# Patient Record
Sex: Female | Born: 1955 | Race: White | Hispanic: No | Marital: Married | Smoking: Former smoker
Health system: Southern US, Community
[De-identification: ages and names within clinical notes are randomized; demographics above are authoritative.]

## PROBLEM LIST (undated history)

## (undated) DIAGNOSIS — I639 Cerebral infarction, unspecified: Secondary | ICD-10-CM

## (undated) DIAGNOSIS — I482 Chronic atrial fibrillation, unspecified: Secondary | ICD-10-CM

## (undated) DIAGNOSIS — I2510 Atherosclerotic heart disease of native coronary artery without angina pectoris: Secondary | ICD-10-CM

## (undated) DIAGNOSIS — G9341 Metabolic encephalopathy: Secondary | ICD-10-CM

## (undated) DIAGNOSIS — IMO0002 Reserved for concepts with insufficient information to code with codable children: Secondary | ICD-10-CM

## (undated) DIAGNOSIS — J9621 Acute and chronic respiratory failure with hypoxia: Secondary | ICD-10-CM

## (undated) DIAGNOSIS — E785 Hyperlipidemia, unspecified: Secondary | ICD-10-CM

## (undated) DIAGNOSIS — J90 Pleural effusion, not elsewhere classified: Secondary | ICD-10-CM

## (undated) DIAGNOSIS — J9611 Chronic respiratory failure with hypoxia: Secondary | ICD-10-CM

## (undated) DIAGNOSIS — E039 Hypothyroidism, unspecified: Secondary | ICD-10-CM

## (undated) DIAGNOSIS — Z930 Tracheostomy status: Secondary | ICD-10-CM

## (undated) DIAGNOSIS — I5023 Acute on chronic systolic (congestive) heart failure: Secondary | ICD-10-CM

## (undated) DIAGNOSIS — E1165 Type 2 diabetes mellitus with hyperglycemia: Secondary | ICD-10-CM

## (undated) DIAGNOSIS — K7460 Unspecified cirrhosis of liver: Secondary | ICD-10-CM

## (undated) DIAGNOSIS — I10 Essential (primary) hypertension: Secondary | ICD-10-CM

## (undated) DIAGNOSIS — R188 Other ascites: Secondary | ICD-10-CM

## (undated) HISTORY — PX: AORTIC VALVE REPLACEMENT (AVR)/CORONARY ARTERY BYPASS GRAFTING (CABG): SHX5725

## (undated) HISTORY — PX: TRACHEOSTOMY: SUR1362

## (undated) HISTORY — PX: CORONARY ARTERY BYPASS GRAFT: SHX141

---

## 2017-10-31 ENCOUNTER — Other Ambulatory Visit (HOSPITAL_COMMUNITY): Payer: Self-pay

## 2017-10-31 ENCOUNTER — Inpatient Hospital Stay
Admission: AD | Admit: 2017-10-31 | Discharge: 2017-12-12 | Payer: Medicare Other | Source: Ambulatory Visit | Attending: Internal Medicine | Admitting: Internal Medicine

## 2017-10-31 DIAGNOSIS — F330 Major depressive disorder, recurrent, mild: Secondary | ICD-10-CM

## 2017-10-31 DIAGNOSIS — R188 Other ascites: Secondary | ICD-10-CM

## 2017-10-31 DIAGNOSIS — J9621 Acute and chronic respiratory failure with hypoxia: Secondary | ICD-10-CM

## 2017-10-31 DIAGNOSIS — J9690 Respiratory failure, unspecified, unspecified whether with hypoxia or hypercapnia: Secondary | ICD-10-CM

## 2017-10-31 DIAGNOSIS — Z4659 Encounter for fitting and adjustment of other gastrointestinal appliance and device: Secondary | ICD-10-CM

## 2017-10-31 DIAGNOSIS — G9341 Metabolic encephalopathy: Secondary | ICD-10-CM

## 2017-10-31 DIAGNOSIS — I5023 Acute on chronic systolic (congestive) heart failure: Secondary | ICD-10-CM

## 2017-10-31 DIAGNOSIS — N179 Acute kidney failure, unspecified: Secondary | ICD-10-CM

## 2017-10-31 DIAGNOSIS — Z9689 Presence of other specified functional implants: Secondary | ICD-10-CM

## 2017-10-31 DIAGNOSIS — K567 Ileus, unspecified: Secondary | ICD-10-CM

## 2017-10-31 DIAGNOSIS — Z931 Gastrostomy status: Secondary | ICD-10-CM

## 2017-10-31 DIAGNOSIS — Z930 Tracheostomy status: Secondary | ICD-10-CM

## 2017-10-31 DIAGNOSIS — J90 Pleural effusion, not elsewhere classified: Secondary | ICD-10-CM | POA: Diagnosis present

## 2017-10-31 DIAGNOSIS — Z9889 Other specified postprocedural states: Secondary | ICD-10-CM

## 2017-10-31 DIAGNOSIS — I482 Chronic atrial fibrillation, unspecified: Secondary | ICD-10-CM | POA: Diagnosis present

## 2017-10-31 DIAGNOSIS — Z431 Encounter for attention to gastrostomy: Secondary | ICD-10-CM

## 2017-10-31 DIAGNOSIS — J9611 Chronic respiratory failure with hypoxia: Secondary | ICD-10-CM

## 2017-10-31 DIAGNOSIS — R0603 Acute respiratory distress: Secondary | ICD-10-CM

## 2017-10-31 HISTORY — DX: Reserved for concepts with insufficient information to code with codable children: IMO0002

## 2017-10-31 HISTORY — DX: Acute on chronic systolic (congestive) heart failure: I50.23

## 2017-10-31 HISTORY — DX: Pleural effusion, not elsewhere classified: J90

## 2017-10-31 HISTORY — DX: Unspecified cirrhosis of liver: K74.60

## 2017-10-31 HISTORY — DX: Chronic respiratory failure with hypoxia: J96.11

## 2017-10-31 HISTORY — DX: Tracheostomy status: Z93.0

## 2017-10-31 HISTORY — DX: Hyperlipidemia, unspecified: E78.5

## 2017-10-31 HISTORY — DX: Other ascites: R18.8

## 2017-10-31 HISTORY — DX: Essential (primary) hypertension: I10

## 2017-10-31 HISTORY — DX: Atherosclerotic heart disease of native coronary artery without angina pectoris: I25.10

## 2017-10-31 HISTORY — DX: Type 2 diabetes mellitus with hyperglycemia: E11.65

## 2017-10-31 HISTORY — DX: Acute and chronic respiratory failure with hypoxia: J96.21

## 2017-10-31 HISTORY — DX: Hypothyroidism, unspecified: E03.9

## 2017-10-31 HISTORY — DX: Chronic atrial fibrillation, unspecified: I48.20

## 2017-10-31 HISTORY — DX: Metabolic encephalopathy: G93.41

## 2017-10-31 HISTORY — DX: Cerebral infarction, unspecified: I63.9

## 2017-11-01 ENCOUNTER — Encounter: Payer: Self-pay | Admitting: Internal Medicine

## 2017-11-01 ENCOUNTER — Other Ambulatory Visit (HOSPITAL_COMMUNITY): Payer: Self-pay

## 2017-11-01 DIAGNOSIS — Z930 Tracheostomy status: Secondary | ICD-10-CM

## 2017-11-01 DIAGNOSIS — G9341 Metabolic encephalopathy: Secondary | ICD-10-CM

## 2017-11-01 DIAGNOSIS — I482 Chronic atrial fibrillation, unspecified: Secondary | ICD-10-CM | POA: Diagnosis present

## 2017-11-01 DIAGNOSIS — J9621 Acute and chronic respiratory failure with hypoxia: Secondary | ICD-10-CM | POA: Diagnosis not present

## 2017-11-01 DIAGNOSIS — I5023 Acute on chronic systolic (congestive) heart failure: Secondary | ICD-10-CM | POA: Diagnosis not present

## 2017-11-01 DIAGNOSIS — J9611 Chronic respiratory failure with hypoxia: Secondary | ICD-10-CM

## 2017-11-01 DIAGNOSIS — J90 Pleural effusion, not elsewhere classified: Secondary | ICD-10-CM | POA: Diagnosis present

## 2017-11-01 HISTORY — DX: Metabolic encephalopathy: G93.41

## 2017-11-01 HISTORY — DX: Acute and chronic respiratory failure with hypoxia: J96.21

## 2017-11-01 HISTORY — DX: Tracheostomy status: Z93.0

## 2017-11-01 HISTORY — DX: Chronic respiratory failure with hypoxia: J96.11

## 2017-11-01 HISTORY — DX: Acute on chronic systolic (congestive) heart failure: I50.23

## 2017-11-01 LAB — MAGNESIUM: Magnesium: 1.8 mg/dL (ref 1.7–2.4)

## 2017-11-01 LAB — URINALYSIS, MICROSCOPIC (REFLEX): WBC, UA: >50 WBC/hpf (ref 0–5)

## 2017-11-01 LAB — CBC WITH DIFFERENTIAL/PLATELET
Basophils Absolute: 0 10*3/uL (ref 0.0–0.1)
Basophils Relative: 0 %
EOS ABS: 0 10*3/uL (ref 0.0–0.7)
EOS PCT: 0 %
HCT: 30.9 % — ABNORMAL LOW (ref 36.0–46.0)
Hemoglobin: 9.5 g/dL — ABNORMAL LOW (ref 12.0–15.0)
Lymphocytes Relative: 6 %
Lymphs Abs: 0.7 10*3/uL (ref 0.7–4.0)
MCH: 28.8 pg (ref 26.0–34.0)
MCHC: 30.7 g/dL (ref 30.0–36.0)
MCV: 93.6 fL (ref 78.0–100.0)
MONO ABS: 0.6 10*3/uL (ref 0.1–1.0)
Monocytes Relative: 5 %
NEUTROS PCT: 89 %
Neutro Abs: 10.9 10*3/uL — ABNORMAL HIGH (ref 1.7–7.7)
PLATELETS: 313 10*3/uL (ref 150–400)
RBC: 3.3 MIL/uL — AB (ref 3.87–5.11)
RDW: 23 % — ABNORMAL HIGH (ref 11.5–15.5)
WBC: 12.2 10*3/uL — AB (ref 4.0–10.5)

## 2017-11-01 LAB — COMPREHENSIVE METABOLIC PANEL
ALBUMIN: 2.2 g/dL — AB (ref 3.5–5.0)
ALK PHOS: 139 U/L — AB (ref 38–126)
ALT: 15 U/L (ref 0–44)
AST: 20 U/L (ref 15–41)
Anion gap: 8 (ref 5–15)
BUN: 27 mg/dL — ABNORMAL HIGH (ref 8–23)
CALCIUM: 9.6 mg/dL (ref 8.9–10.3)
CO2: 25 mmol/L (ref 22–32)
Chloride: 105 mmol/L (ref 98–111)
Creatinine, Ser: 1.39 mg/dL — ABNORMAL HIGH (ref 0.44–1.00)
GFR calc Af Amer: 46 mL/min — ABNORMAL LOW (ref >60)
GFR calc non Af Amer: 40 mL/min — ABNORMAL LOW (ref >60)
GLUCOSE: 171 mg/dL — AB (ref 70–99)
Potassium: 3.7 mmol/L (ref 3.5–5.1)
Sodium: 138 mmol/L (ref 135–145)
Total Bilirubin: 1.2 mg/dL (ref 0.3–1.2)
Total Protein: 5.8 g/dL — ABNORMAL LOW (ref 6.5–8.1)

## 2017-11-01 LAB — PHOSPHORUS: Phosphorus: 2.9 mg/dL (ref 2.5–4.6)

## 2017-11-01 LAB — TSH: TSH: 16.168 u[IU]/mL — ABNORMAL HIGH (ref 0.350–4.500)

## 2017-11-01 LAB — HEMOGLOBIN A1C
HEMOGLOBIN A1C: 5.5 % (ref 4.8–5.6)
Mean Plasma Glucose: 111.15 mg/dL

## 2017-11-01 LAB — URINALYSIS, ROUTINE W REFLEX MICROSCOPIC
Glucose, UA: NEGATIVE mg/dL
KETONES UR: NEGATIVE mg/dL
Nitrite: NEGATIVE
Protein, ur: >300 mg/dL — AB
Specific Gravity, Urine: 1.025 (ref 1.005–1.030)
pH: 6 (ref 5.0–8.0)

## 2017-11-01 LAB — PROTIME-INR
INR: 1.15
PROTHROMBIN TIME: 14.6 s (ref 11.4–15.2)

## 2017-11-01 MED ORDER — GENERIC EXTERNAL MEDICATION
10.00 | Status: DC
Start: ? — End: 2017-11-01

## 2017-11-01 MED ORDER — FENTANYL CITRATE (PF) 2500 MCG/50ML IJ SOLN
50.00 | INTRAMUSCULAR | Status: DC
Start: ? — End: 2017-11-01

## 2017-11-01 MED ORDER — RIFAXIMIN 550 MG PO TABS
550.00 | ORAL_TABLET | ORAL | Status: DC
Start: 2017-10-31 — End: 2017-11-01

## 2017-11-01 MED ORDER — FIRST-LANSOPRAZOLE 3 MG/ML PO SUSP
30.00 | ORAL | Status: DC
Start: 2017-10-31 — End: 2017-11-01

## 2017-11-01 MED ORDER — SODIUM CHLORIDE 0.9 % IV SOLN
10.00 | INTRAVENOUS | Status: DC
Start: ? — End: 2017-11-01

## 2017-11-01 MED ORDER — SIMETHICONE 80 MG PO CHEW
80.00 | CHEWABLE_TABLET | ORAL | Status: DC
Start: 2017-10-31 — End: 2017-11-01

## 2017-11-01 MED ORDER — FAT EMULSION PLANT BASED 20 % IV EMUL
500.00 | INTRAVENOUS | Status: DC
Start: 2017-11-02 — End: 2017-11-01

## 2017-11-01 MED ORDER — GENERIC EXTERNAL MEDICATION
25.00 | Status: DC
Start: ? — End: 2017-11-01

## 2017-11-01 MED ORDER — DEXTROSE 10 % IV SOLN
75.00 | INTRAVENOUS | Status: DC
Start: ? — End: 2017-11-01

## 2017-11-01 MED ORDER — PAROXETINE HCL 20 MG PO TABS
40.00 | ORAL_TABLET | ORAL | Status: DC
Start: 2017-11-01 — End: 2017-11-01

## 2017-11-01 MED ORDER — GENERIC EXTERNAL MEDICATION
Status: DC
Start: ? — End: 2017-11-01

## 2017-11-01 MED ORDER — GENERIC EXTERNAL MEDICATION
3.00 | Status: DC
Start: ? — End: 2017-11-01

## 2017-11-01 MED ORDER — LEVOTHYROXINE SODIUM 200 MCG IV SOLR
200.00 | INTRAVENOUS | Status: DC
Start: 2017-11-02 — End: 2017-11-01

## 2017-11-01 MED ORDER — METOCLOPRAMIDE HCL 5 MG/ML IJ SOLN
5.00 | INTRAMUSCULAR | Status: DC
Start: 2017-10-31 — End: 2017-11-01

## 2017-11-01 MED ORDER — LIDOCAINE HCL (PF) 2 % IJ SOLN
10.00 | INTRAMUSCULAR | Status: DC
Start: ? — End: 2017-11-01

## 2017-11-01 MED ORDER — METOPROLOL TARTRATE 25 MG PO TABS
25.00 | ORAL_TABLET | ORAL | Status: DC
Start: 2017-10-31 — End: 2017-11-01

## 2017-11-01 MED ORDER — GENERIC EXTERNAL MEDICATION
5.00 | Status: DC
Start: ? — End: 2017-11-01

## 2017-11-01 MED ORDER — DEXTROSE-NACL 5-0.9 % IV SOLN
75.00 | INTRAVENOUS | Status: DC
Start: ? — End: 2017-11-01

## 2017-11-01 NOTE — Consult Note (Signed)
[facility]  Date of Service: 11/01/2017  PULMONARY CONSULT   Maryfrances Bunnellatricia A Wiseman  MWN:[id number]:[id number]  DOB: 01/24/1956   DOA: 10/31/2017  Referring Physician: Carron CurieAli Hijazi, MD  HPI: Maryfrances Bunnellatricia A Nin is a 62 y.o. female seen for follow up of Acute on Chronic Respiratory Failure.  Flex history including history of diabetes type 2 normal height both thyroidism hyperlipidemia hypertension coronary artery disease status post CABG chronic systolic heart failure stroke atrial fibrillation presented to the hospital appears on 29 July with complaints of abdominal pain low blood pressure altered mental status and acute respiratory failure.  An initial evaluation patient was emergently intubated and started on vasopressors.  She was found to be in shock felt in the undifferentiated variety of shock.  She did have a history of congestive heart failure with an ejection fraction of less than 25% so this was a working diagnosis.  Patient is also significantly encephalopathic and so therefore she was started empirically on lactulose.  Chest x-ray showed ongoing pulmonary edema.  Her oxygen requirements continued to remain high.  She was not able to wean off the ventilator and eventually had to have a tracheostomy done.  Her encephalopathy was felt the possibly hepatic related as she appeared to be treated for that.  GI recommendations were to complete Xifaxan twice daily and monitor her hemoglobin initially had been having some issues with GI bleeding apparently.  By the notes it appears that her hemoglobin had finally stabilized.  As far as her cardiac status is concerned she has an extremely low ejection fraction and she will probably need a defibrillator at some point which was not done  Review of Systems:  ROS performed and is unremarkable other than noted above.  Past Medical History:  Diagnosis Date  . Acute metabolic encephalopathy 11/01/2017  .  Acute on chronic respiratory failure with hypoxia (HCC) 11/01/2017  . Acute on chronic systolic CHF (congestive heart failure) (HCC) 11/01/2017  . Chronic atrial fibrillation (HCC)   . Chronic respiratory failure with hypoxia (HCC) 11/01/2017  . Coronary artery disease   . Diabetes type 2, uncontrolled (HCC)   . Hyperlipidemia   . Hypertension   . Hypothyroidism   . Pleural effusion, right   . Stroke (cerebrum) (HCC)   . 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 AORTIC VALVE REPLACEMENT (AVR)/CORONARY ARTERY BYPASS GRAFTING (CABG)    . CORONARY ARTERY BYPASS GRAFT    . TRACHEOSTOMY      Social History:    has an unknown smoking status. She has never used smokeless tobacco. She reports that she drank alcohol. She reports that she has current or past drug history.  Family History: Non-Contributory to the present illness  Allergies not on file  Medications: Reviewed on Rounds  Physical Exam:  Vitals:  Temperature 98.5 pulse 112 respiratory rate 32 blood pressure 147/96 saturations 98%  Ventilator Settings off the ventilator on T collar 28%  . General: Comfortable at this time . Eyes: Grossly normal lids, irises & conjunctiva . ENT: grossly tongue is normal . Neck: no obvious mass . Cardiovascular: S1-S2 normal no rubs . Respiratory: Coarse breath sounds few rhonchi . Abdomen: Obese soft . Skin: no rash seen on limited exam . Musculoskeletal: not rigid . Psychiatric:unable to assess . Neurologic: no seizure no involuntary movements         Labs on Admission:  Basic Metabolic Panel: Recent  Labs  Lab 11/01/17 0521  NA 138  K 3.7  CL 105  CO2 25  GLUCOSE 171*  BUN 27*  CREATININE 1.39*  CALCIUM 9.6  MG 1.8  PHOS 2.9    Liver Function Tests: Recent Labs  Lab 11/01/17 0521  AST 20  ALT 15  ALKPHOS 139*  BILITOT 1.2  PROT 5.8*  ALBUMIN 2.2*   No results for input(s): LIPASE, AMYLASE in the last 168  hours. No results for input(s): AMMONIA in the last 168 hours.  CBC: Recent Labs  Lab 11/01/17 0521  WBC 12.2*  NEUTROABS 10.9*  HGB 9.5*  HCT 30.9*  MCV 93.6  PLT 313    Cardiac Enzymes: No results for input(s): CKTOTAL, CKMB, CKMBINDEX, TROPONINI in the last 168 hours.  BNP (last 3 results) No results for input(s): BNP in the last 8760 hours.  ProBNP (last 3 results) No results for input(s): PROBNP in the last 8760 hours.   Radiological Exams on Admission: Dg Chest Port 1 View  Result Date: 10/31/2017 CLINICAL DATA:  Respiratory failure. EXAM: PORTABLE CHEST 1 VIEW COMPARISON:  None. FINDINGS: Tracheostomy tube projects 4.3 cm above the carina. LEFT PICC distal tip projects in mid superior vena cava. Nasogastric tube tip projecting in proximal stomach. Status post median sternotomy and pulmonary clipping. Moderate cardiomegaly. Pulmonary vascular congestion. Patchy RIGHT upper lobe and retrocardiac consolidation. Small pleural effusions. No pneumothorax. Surgical clips RIGHT chest wall, potentially within the breast. Osseous structures are non suspicious. IMPRESSION: 1. Tracheostomy tube projects 4.3 cm above the carina. LEFT PICC distal tip projects in mid superior vena cava. Nasogastric tube tip projecting in proximal stomach. 2. Cardiomegaly and pulmonary vascular congestion. Small pleural effusions. 3. RIGHT upper lobe and retrocardiac airspace opacities concerning for pneumonia. Followup PA and lateral chest X-ray is recommended in 3-4 weeks following trial of antibiotic therapy to ensure resolution and exclude underlying malignancy. Electronically Signed   By: Awilda Metroourtnay  Bloomer M.D.   On: 10/31/2017 19:49   Dg Abd Portable 1v  Result Date: 10/31/2017 CLINICAL DATA:  Ileus. EXAM: PORTABLE ABDOMEN - 1 VIEW COMPARISON:  None. FINDINGS: Nasogastric tube tip projects in proximal stomach. Gas-filled mildly distended small bowel in the included abdomen. Paucity of large bowel gas.  Central displacement of bowel loops. Included soft tissue planes and osseous structures are non suspicious. IMPRESSION: Nasogastric tube tip projects in proximal stomach. Mild gas distended small bowel seen with ileus or early small bowel obstruction. Central displacement of bowel concerning for ascites. Electronically Signed   By: Awilda Metroourtnay  Bloomer M.D.   On: 10/31/2017 19:51    Assessment/Plan Principal Problem:   Acute on chronic respiratory failure with hypoxia (HCC) Active Problems:   Chronic respiratory failure with hypoxia (HCC)   Acute on chronic systolic CHF (congestive heart failure) (HCC)   Acute metabolic encephalopathy   Tracheostomy status (HCC)   Chronic atrial fibrillation (HCC)   Pleural effusion, right   1. Acute on chronic respiratory failure with hypoxia at this time patient is on T collar has been doing fairly well with weaning.  We will continue to advance the weaning continue aggressive pulmonary toilet supportive care titrate oxygen as necessary 2. Acute on chronic systolic heart failure need to monitor her fluid status closely follow x-rays for any worsening of pulmonary edema.  Continue with supportive care 3. Acute metabolic encephalopathy right now appears to be stable we will continue to monitor 4. Chronic atrial fibrillation rate is controlled we will follow-up as tolerated. 5. Right-sided pleural  effusion follow x-ray as necessary.  The last chest film shows some concern for airspace disease could be versus heart failure would do a follow-up chest x-ray as indicated. 6. Tracheostomy status ultimate goal is to wean and decannulate  I have personally seen and evaluated the patient, evaluated laboratory and imaging results, formulated the assessment and plan and placed orders. The Patient requires high complexity decision making for assessment and support.  Case was discussed on Rounds with the Respiratory Therapy Staff Time Spent  Yevonne Pax, MD  [facility] Pulmonary Critical Care Medicine Sleep Medicine

## 2017-11-02 ENCOUNTER — Other Ambulatory Visit (HOSPITAL_COMMUNITY): Payer: Self-pay

## 2017-11-02 DIAGNOSIS — J9621 Acute and chronic respiratory failure with hypoxia: Secondary | ICD-10-CM | POA: Diagnosis not present

## 2017-11-02 DIAGNOSIS — G9341 Metabolic encephalopathy: Secondary | ICD-10-CM | POA: Diagnosis not present

## 2017-11-02 DIAGNOSIS — I482 Chronic atrial fibrillation: Secondary | ICD-10-CM | POA: Diagnosis not present

## 2017-11-02 DIAGNOSIS — I5023 Acute on chronic systolic (congestive) heart failure: Secondary | ICD-10-CM | POA: Diagnosis not present

## 2017-11-02 LAB — RENAL FUNCTION PANEL
ANION GAP: 10 (ref 5–15)
Albumin: 2.2 g/dL — ABNORMAL LOW (ref 3.5–5.0)
BUN: 28 mg/dL — ABNORMAL HIGH (ref 8–23)
CALCIUM: 9.7 mg/dL (ref 8.9–10.3)
CO2: 23 mmol/L (ref 22–32)
Chloride: 105 mmol/L (ref 98–111)
Creatinine, Ser: 1.3 mg/dL — ABNORMAL HIGH (ref 0.44–1.00)
GFR calc Af Amer: 50 mL/min — ABNORMAL LOW (ref >60)
GFR, EST NON AFRICAN AMERICAN: 43 mL/min — AB (ref >60)
Glucose, Bld: 192 mg/dL — ABNORMAL HIGH (ref 70–99)
Phosphorus: 3.5 mg/dL (ref 2.5–4.6)
Potassium: 4 mmol/L (ref 3.5–5.1)
SODIUM: 138 mmol/L (ref 135–145)

## 2017-11-02 LAB — MAGNESIUM: MAGNESIUM: 1.9 mg/dL (ref 1.7–2.4)

## 2017-11-02 LAB — CBC
HCT: 32.6 % — ABNORMAL LOW (ref 36.0–46.0)
Hemoglobin: 9.9 g/dL — ABNORMAL LOW (ref 12.0–15.0)
MCH: 28.6 pg (ref 26.0–34.0)
MCHC: 30.4 g/dL (ref 30.0–36.0)
MCV: 94.2 fL (ref 78.0–100.0)
Platelets: 291 10*3/uL (ref 150–400)
RBC: 3.46 MIL/uL — AB (ref 3.87–5.11)
RDW: 22.8 % — ABNORMAL HIGH (ref 11.5–15.5)
WBC: 10.7 10*3/uL — ABNORMAL HIGH (ref 4.0–10.5)

## 2017-11-02 NOTE — Progress Notes (Signed)
[facility] [place]   PULMONARY SERVICE  PROGRESS NOTE  Date of Service: 11/02/2017  Glenda Walker  ZOX:[id number]  DOB: 12/24/1955   DOA: 10/31/2017  Referring Physician: Carron CurieAli Hijazi, MD  HPI: Glenda Bunnellatricia A Auth is a 62 y.o. female seen for follow up of Acute on Chronic Respiratory Failure.  She is sitting up in the chair looks comfortable without distress.  Has been tolerating T collar remains on 28% FiO2  Medications: Reviewed on Rounds  Physical Exam:  Vitals: Temperature 97.0 pulse 93 respiratory rate 22 blood pressure 132/92 saturations 99%  Ventilator Settings on T collar FiO2 28%  . General: Comfortable at this time . Eyes: Grossly normal lids, irises & conjunctiva . ENT: grossly tongue is normal . Neck: no obvious mass . Cardiovascular: S1 S2 normal no gallop . Respiratory: No rhonchi no rales are noted . Abdomen: soft . Skin: no rash seen on limited exam . Musculoskeletal: not rigid . Psychiatric:unable to assess . Neurologic: no seizure no involuntary movements         Lab Data:   Basic Metabolic Panel: Recent Labs  Lab 11/01/17 0521 11/02/17 0457  NA 138 138  K 3.7 4.0  CL 105 105  CO2 25 23  GLUCOSE 171* 192*  BUN 27* 28*  CREATININE 1.39* 1.30*  CALCIUM 9.6 9.7  MG 1.8 1.9  PHOS 2.9 3.5    Liver Function Tests: Recent Labs  Lab 11/01/17 0521 11/02/17 0457  AST 20  --   ALT 15  --   ALKPHOS 139*  --   BILITOT 1.2  --   PROT 5.8*  --   ALBUMIN 2.2* 2.2*   No results for input(s): LIPASE, AMYLASE in the last 168 hours. No results for input(s): AMMONIA in the last 168 hours.  CBC: Recent Labs  Lab 11/01/17 0521 11/02/17 0457  WBC 12.2* 10.7*  NEUTROABS 10.9*  --   HGB 9.5* 9.9*  HCT 30.9* 32.6*  MCV 93.6 94.2  PLT 313 291    Cardiac Enzymes: No results for input(s): CKTOTAL, CKMB, CKMBINDEX, TROPONINI in the last 168 hours.  BNP (last 3 results) No results for input(s): BNP in  the last 8760 hours.  ProBNP (last 3 results) No results for input(s): PROBNP in the last 8760 hours.  Radiological Exams: Dg Chest Port 1 View  Result Date: 10/31/2017 CLINICAL DATA:  Respiratory failure. EXAM: PORTABLE CHEST 1 VIEW COMPARISON:  None. FINDINGS: Tracheostomy tube projects 4.3 cm above the carina. LEFT PICC distal tip projects in mid superior vena cava. Nasogastric tube tip projecting in proximal stomach. Status post median sternotomy and pulmonary clipping. Moderate cardiomegaly. Pulmonary vascular congestion. Patchy RIGHT upper lobe and retrocardiac consolidation. Small pleural effusions. No pneumothorax. Surgical clips RIGHT chest wall, potentially within the breast. Osseous structures are non suspicious. IMPRESSION: 1. Tracheostomy tube projects 4.3 cm above the carina. LEFT PICC distal tip projects in mid superior vena cava. Nasogastric tube tip projecting in proximal stomach. 2. Cardiomegaly and pulmonary vascular congestion. Small pleural effusions. 3. RIGHT upper lobe and retrocardiac airspace opacities concerning for pneumonia. Followup PA and lateral chest X-ray is recommended in 3-4 weeks following trial of antibiotic therapy to ensure resolution and exclude underlying malignancy. Electronically Signed   By: Awilda Metroourtnay  Bloomer M.D.   On: 10/31/2017 19:49   Dg Abd Portable 1v  Result Date: 11/01/2017 CLINICAL DATA:  NG tube placement. EXAM: PORTABLE ABDOMEN - 1 VIEW COMPARISON:  Single-view of the abdomen 10/31/2016. FINDINGS:  NG tube tip and side port project in the stomach. IMPRESSION: As above. Electronically Signed   By: Drusilla Kannerhomas  Dalessio M.D.   On: 11/01/2017 18:56   Dg Abd Portable 1v  Result Date: 10/31/2017 CLINICAL DATA:  Ileus. EXAM: PORTABLE ABDOMEN - 1 VIEW COMPARISON:  None. FINDINGS: Nasogastric tube tip projects in proximal stomach. Gas-filled mildly distended small bowel in the included abdomen. Paucity of large bowel gas. Central displacement of bowel loops.  Included soft tissue planes and osseous structures are non suspicious. IMPRESSION: Nasogastric tube tip projects in proximal stomach. Mild gas distended small bowel seen with ileus or early small bowel obstruction. Central displacement of bowel concerning for ascites. Electronically Signed   By: Awilda Metroourtnay  Bloomer M.D.   On: 10/31/2017 19:51    Assessment/Plan Principal Problem:   Acute on chronic respiratory failure with hypoxia (HCC) Active Problems:   Chronic respiratory failure with hypoxia (HCC)   Acute on chronic systolic CHF (congestive heart failure) (HCC)   Acute metabolic encephalopathy   Tracheostomy status (HCC)   Chronic atrial fibrillation (HCC)   Pleural effusion, right   1. Acute on chronic respiratory failure with hypoxia we will continue with weaning on T collar continue aggressive pulmonary toilet.  Secretions are still significant will need to monitor and see if she is ready for capping. 2. Chronic atrial fibrillation rate is controlled we will continue to follow the right. 3. Right-sided pleural effusion continue with supportive care 4. Tracheostomy working towards decannulation 5. Metabolic encephalopathy at baseline continue present management 6. Acute on chronic systolic heart failure compensated continue to follow   I have personally seen and evaluated the patient, evaluated laboratory and imaging results, formulated the assessment and plan and placed orders. The Patient requires high complexity decision making for assessment and support.  Case was discussed on Rounds with the Respiratory Therapy Staff time spent 35 minutes coordination of care with primary care physician team as well as medical staff  Yevonne PaxSaadat A Ballard Budney, MD [facility] Pulmonary Critical Care Medicine Sleep Medicine

## 2017-11-03 DIAGNOSIS — I5023 Acute on chronic systolic (congestive) heart failure: Secondary | ICD-10-CM | POA: Diagnosis not present

## 2017-11-03 DIAGNOSIS — J9621 Acute and chronic respiratory failure with hypoxia: Secondary | ICD-10-CM | POA: Diagnosis not present

## 2017-11-03 DIAGNOSIS — I482 Chronic atrial fibrillation: Secondary | ICD-10-CM | POA: Diagnosis not present

## 2017-11-03 DIAGNOSIS — G9341 Metabolic encephalopathy: Secondary | ICD-10-CM | POA: Diagnosis not present

## 2017-11-03 LAB — URINE CULTURE

## 2017-11-03 LAB — BLOOD GAS, ARTERIAL
Acid-Base Excess: 0.1 mmol/L (ref 0.0–2.0)
Bicarbonate: 24.6 mmol/L (ref 20.0–28.0)
FIO2: 28
O2 Saturation: 97.6 %
PCO2 ART: 42.9 mmHg (ref 32.0–48.0)
PH ART: 7.377 (ref 7.350–7.450)
Patient temperature: 98.6
pO2, Arterial: 97.3 mmHg (ref 83.0–108.0)

## 2017-11-03 NOTE — Progress Notes (Signed)
Pulmonary Critical Care Medicine [facility] [place]   PULMONARY SERVICE  PROGRESS NOTE  Date of Service: 11/03/2017  ELKA SATTERFIELD  ZOX:[id number]  DOB: 03/17/1955   DOA: 10/31/2017  Referring Physician: Carron Curie, MD  HPI: Glenda Walker is a 62 y.o. female seen for follow up of Acute on Chronic Respiratory Failure.  Patient right now is on T collar has been tolerating it well still has a #8 tube in place which I think can be changed at this point  Medications: Reviewed on Rounds  Physical Exam:  Vitals: Temperature 97.0 pulse 84 respiratory rate 18 blood pressure 160/52 saturations 99%  Ventilator Settings currently on T collar FiO2 28%  . General: Comfortable at this time . Eyes: Grossly normal lids, irises & conjunctiva . ENT: grossly tongue is normal . Neck: no obvious mass . Cardiovascular: S1 S2 normal no gallop . Respiratory: No rhonchi no rales are noted at this time . Abdomen: soft . Skin: no rash seen on limited exam . Musculoskeletal: not rigid . Psychiatric:unable to assess . Neurologic: no seizure no involuntary movements         Lab Data:   Basic Metabolic Panel: Recent Labs  Lab 11/01/17 0521 11/02/17 0457  NA 138 138  K 3.7 4.0  CL 105 105  CO2 25 23  GLUCOSE 171* 192*  BUN 27* 28*  CREATININE 1.39* 1.30*  CALCIUM 9.6 9.7  MG 1.8 1.9  PHOS 2.9 3.5    Liver Function Tests: Recent Labs  Lab 11/01/17 0521 11/02/17 0457  AST 20  --   ALT 15  --   ALKPHOS 139*  --   BILITOT 1.2  --   PROT 5.8*  --   ALBUMIN 2.2* 2.2*   No results for input(s): LIPASE, AMYLASE in the last 168 hours. No results for input(s): AMMONIA in the last 168 hours.  CBC: Recent Labs  Lab 11/01/17 0521 11/02/17 0457  WBC 12.2* 10.7*  NEUTROABS 10.9*  --   HGB 9.5* 9.9*  HCT 30.9* 32.6*  MCV 93.6 94.2  PLT 313 291    Cardiac Enzymes: No results for input(s): CKTOTAL, CKMB, CKMBINDEX, TROPONINI in the last 168 hours.  BNP (last 3  results) No results for input(s): BNP in the last 8760 hours.  ProBNP (last 3 results) No results for input(s): PROBNP in the last 8760 hours.  Radiological Exams: Dg Abd Portable 1v  Result Date: 11/01/2017 CLINICAL DATA:  NG tube placement. EXAM: PORTABLE ABDOMEN - 1 VIEW COMPARISON:  Single-view of the abdomen 10/31/2016. FINDINGS: NG tube tip and side port project in the stomach. IMPRESSION: As above. Electronically Signed   By: Drusilla Kanner M.D.   On: 11/01/2017 18:56    Assessment/Plan Principal Problem:   Acute on chronic respiratory failure with hypoxia (HCC) Active Problems:   Chronic respiratory failure with hypoxia (HCC)   Acute on chronic systolic CHF (congestive heart failure) (HCC)   Acute metabolic encephalopathy   Tracheostomy status (HCC)   Chronic atrial fibrillation (HCC)   Pleural effusion, right   1. Acute on chronic respiratory failure with hypoxia we will continue to wean on T collar 9 think I would go ahead and change her tracheostomy to #6 cuffless at this point. 2. Acute on chronic systolic heart failure compensated continue with supportive care diuresis tolerated 3. Acute metabolic encephalopathy grossly unchanged 4. Tracheostomy status as noted above we will downsize trach 5. Chronic atrial fibrillation rate is controlled we will continue to follow  6. Pleural effusion right-sided we will continue with present management   I have personally seen and evaluated the patient, evaluated laboratory and imaging results, formulated the assessment and plan and placed orders. The Patient requires high complexity decision making for assessment and support.  Case was discussed on Rounds with the Respiratory Therapy Staff  Yevonne PaxSaadat A Lyden Redner, MD [facility] Pulmonary Critical Care Medicine Sleep Medicine

## 2017-11-04 ENCOUNTER — Ambulatory Visit (HOSPITAL_COMMUNITY): Payer: Self-pay | Attending: Nurse Practitioner

## 2017-11-04 ENCOUNTER — Encounter (HOSPITAL_COMMUNITY): Payer: Self-pay

## 2017-11-04 DIAGNOSIS — Z029 Encounter for administrative examinations, unspecified: Secondary | ICD-10-CM | POA: Insufficient documentation

## 2017-11-04 DIAGNOSIS — M7989 Other specified soft tissue disorders: Secondary | ICD-10-CM

## 2017-11-04 DIAGNOSIS — G9341 Metabolic encephalopathy: Secondary | ICD-10-CM | POA: Diagnosis not present

## 2017-11-04 DIAGNOSIS — M79609 Pain in unspecified limb: Secondary | ICD-10-CM

## 2017-11-04 DIAGNOSIS — I5023 Acute on chronic systolic (congestive) heart failure: Secondary | ICD-10-CM | POA: Diagnosis not present

## 2017-11-04 DIAGNOSIS — I482 Chronic atrial fibrillation: Secondary | ICD-10-CM | POA: Diagnosis not present

## 2017-11-04 DIAGNOSIS — J9621 Acute and chronic respiratory failure with hypoxia: Secondary | ICD-10-CM | POA: Diagnosis not present

## 2017-11-04 NOTE — Progress Notes (Signed)
Pulmonary Critical Care Medicine [facility] [place]   PULMONARY CRITICAL CARE SERVICE  PROGRESS NOTE  Date of Service: 11/04/2017  Glenda Bunnellatricia A Kagel  ZOX:[id number]  DOB: 08/04/1955   DOA: 10/31/2017  Referring Physician: Carron CurieAli Hijazi, MD  HPI: Glenda Walker is a 62 y.o. female seen for follow up of Acute on Chronic Respiratory Failure.  Patient is on T collar has been a little bit more confused and probably has a urinary tract infection which is being addressed by the primary care team  Medications: Reviewed on Rounds  Physical Exam:  Vitals: Temperature 97.4 pulse 86 respiratory rate 30 blood pressure 142/50 saturations 98%  Ventilator Settings off the ventilator on T collar  . General: Comfortable at this time . Eyes: Grossly normal lids, irises & conjunctiva . ENT: grossly tongue is normal . Neck: no obvious mass . Cardiovascular: S1 S2 normal no gallop . Respiratory: Coarse breath sounds few scattered rhonchi are noted . Abdomen: soft . Skin: no rash seen on limited exam . Musculoskeletal: not rigid . Psychiatric:unable to assess . Neurologic: no seizure no involuntary movements         Lab Data:   Basic Metabolic Panel: Recent Labs  Lab 11/01/17 0521 11/02/17 0457  NA 138 138  K 3.7 4.0  CL 105 105  CO2 25 23  GLUCOSE 171* 192*  BUN 27* 28*  CREATININE 1.39* 1.30*  CALCIUM 9.6 9.7  MG 1.8 1.9  PHOS 2.9 3.5    Liver Function Tests: Recent Labs  Lab 11/01/17 0521 11/02/17 0457  AST 20  --   ALT 15  --   ALKPHOS 139*  --   BILITOT 1.2  --   PROT 5.8*  --   ALBUMIN 2.2* 2.2*   No results for input(s): LIPASE, AMYLASE in the last 168 hours. No results for input(s): AMMONIA in the last 168 hours.  CBC: Recent Labs  Lab 11/01/17 0521 11/02/17 0457  WBC 12.2* 10.7*  NEUTROABS 10.9*  --   HGB 9.5* 9.9*  HCT 30.9* 32.6*  MCV 93.6 94.2  PLT 313 291    Cardiac Enzymes: No results for input(s): CKTOTAL, CKMB, CKMBINDEX,  TROPONINI in the last 168 hours.  BNP (last 3 results) No results for input(s): BNP in the last 8760 hours.  ProBNP (last 3 results) No results for input(s): PROBNP in the last 8760 hours.  Radiological Exams: No results found.  Assessment/Plan Principal Problem:   Acute on chronic respiratory failure with hypoxia (HCC) Active Problems:   Chronic respiratory failure with hypoxia (HCC)   Acute on chronic systolic CHF (congestive heart failure) (HCC)   Acute metabolic encephalopathy   Tracheostomy status (HCC)   Chronic atrial fibrillation (HCC)   Pleural effusion, right   1. Acute on chronic respiratory failure with hypoxia continue with T collar for now.  Once she is clinically stable we will try to wean again. 2. Acute on chronic systolic heart failure right now compensated we will continue present management. 3. Acute metabolic encephalopathy she is once again confused probably secondary to infection 4. Chronic atrial fibrillation rate is controlled we will continue to follow 5. Pleural effusion right side we will continue with present therapy   I have personally seen and evaluated the patient, evaluated laboratory and imaging results, formulated the assessment and plan and placed orders. The Patient requires high complexity decision making for assessment and support.  Case was discussed on Rounds with the Respiratory Therapy Staff  Yevonne PaxSaadat A Leylah Tarnow, MD  [facility] Pulmonary Critical Care Medicine Sleep Medicine

## 2017-11-04 NOTE — Progress Notes (Signed)
*  Preliminary Results* Left upper extremity venous duplex completed. Visualized veins in left upper extremity are negative for deep vein thrombosis. This exam was limited due to patient positioning and port placement.   11/04/2017 3:40 PM  Lorae Roig Clare Gandyiddle

## 2017-11-05 ENCOUNTER — Other Ambulatory Visit (HOSPITAL_COMMUNITY): Payer: Self-pay

## 2017-11-05 LAB — BASIC METABOLIC PANEL
ANION GAP: 7 (ref 5–15)
BUN: 36 mg/dL — ABNORMAL HIGH (ref 8–23)
CO2: 22 mmol/L (ref 22–32)
Calcium: 9.5 mg/dL (ref 8.9–10.3)
Chloride: 105 mmol/L (ref 98–111)
Creatinine, Ser: 1.33 mg/dL — ABNORMAL HIGH (ref 0.44–1.00)
GFR calc Af Amer: 49 mL/min — ABNORMAL LOW (ref >60)
GFR, EST NON AFRICAN AMERICAN: 42 mL/min — AB (ref >60)
GLUCOSE: 244 mg/dL — AB (ref 70–99)
POTASSIUM: 3.9 mmol/L (ref 3.5–5.1)
Sodium: 134 mmol/L — ABNORMAL LOW (ref 135–145)

## 2017-11-05 LAB — CBC
HEMATOCRIT: 29.8 % — AB (ref 36.0–46.0)
Hemoglobin: 9.1 g/dL — ABNORMAL LOW (ref 12.0–15.0)
MCH: 29.4 pg (ref 26.0–34.0)
MCHC: 30.5 g/dL (ref 30.0–36.0)
MCV: 96.4 fL (ref 78.0–100.0)
Platelets: 261 10*3/uL (ref 150–400)
RBC: 3.09 MIL/uL — ABNORMAL LOW (ref 3.87–5.11)
RDW: 22.8 % — ABNORMAL HIGH (ref 11.5–15.5)
WBC: 13.5 10*3/uL — AB (ref 4.0–10.5)

## 2017-11-05 LAB — MAGNESIUM: Magnesium: 1.8 mg/dL (ref 1.7–2.4)

## 2017-11-05 LAB — PHOSPHORUS: Phosphorus: 3.1 mg/dL (ref 2.5–4.6)

## 2017-11-06 LAB — C DIFFICILE QUICK SCREEN W PCR REFLEX
C DIFFICILE (CDIFF) INTERP: NOT DETECTED
C DIFFICILE (CDIFF) TOXIN: NEGATIVE
C Diff antigen: NEGATIVE

## 2017-11-07 DIAGNOSIS — I5023 Acute on chronic systolic (congestive) heart failure: Secondary | ICD-10-CM | POA: Diagnosis not present

## 2017-11-07 DIAGNOSIS — I482 Chronic atrial fibrillation: Secondary | ICD-10-CM | POA: Diagnosis not present

## 2017-11-07 DIAGNOSIS — J9621 Acute and chronic respiratory failure with hypoxia: Secondary | ICD-10-CM | POA: Diagnosis not present

## 2017-11-07 DIAGNOSIS — G9341 Metabolic encephalopathy: Secondary | ICD-10-CM | POA: Diagnosis not present

## 2017-11-07 LAB — RENAL FUNCTION PANEL
ANION GAP: 11 (ref 5–15)
Albumin: 2.3 g/dL — ABNORMAL LOW (ref 3.5–5.0)
BUN: 44 mg/dL — ABNORMAL HIGH (ref 8–23)
CALCIUM: 10 mg/dL (ref 8.9–10.3)
CO2: 25 mmol/L (ref 22–32)
CREATININE: 1.72 mg/dL — AB (ref 0.44–1.00)
Chloride: 103 mmol/L (ref 98–111)
GFR calc non Af Amer: 31 mL/min — ABNORMAL LOW (ref >60)
GFR, EST AFRICAN AMERICAN: 36 mL/min — AB (ref >60)
Glucose, Bld: 135 mg/dL — ABNORMAL HIGH (ref 70–99)
PHOSPHORUS: 4.2 mg/dL (ref 2.5–4.6)
Potassium: 5.8 mmol/L — ABNORMAL HIGH (ref 3.5–5.1)
SODIUM: 139 mmol/L (ref 135–145)

## 2017-11-07 LAB — CBC
HEMATOCRIT: 35.1 % — AB (ref 36.0–46.0)
Hemoglobin: 10.6 g/dL — ABNORMAL LOW (ref 12.0–15.0)
MCH: 29 pg (ref 26.0–34.0)
MCHC: 30.2 g/dL (ref 30.0–36.0)
MCV: 96.2 fL (ref 78.0–100.0)
Platelets: 276 10*3/uL (ref 150–400)
RBC: 3.65 MIL/uL — ABNORMAL LOW (ref 3.87–5.11)
RDW: 22.8 % — ABNORMAL HIGH (ref 11.5–15.5)
WBC: 14.8 10*3/uL — ABNORMAL HIGH (ref 4.0–10.5)

## 2017-11-07 LAB — BLOOD GAS, ARTERIAL
ACID-BASE DEFICIT: 2.4 mmol/L — AB (ref 0.0–2.0)
BICARBONATE: 22.2 mmol/L (ref 20.0–28.0)
FIO2: 60
O2 Saturation: 99.5 %
PATIENT TEMPERATURE: 98.8
pCO2 arterial: 40.2 mmHg (ref 32.0–48.0)
pH, Arterial: 7.362 (ref 7.350–7.450)
pO2, Arterial: 170 mmHg — ABNORMAL HIGH (ref 83.0–108.0)

## 2017-11-07 LAB — MAGNESIUM: MAGNESIUM: 2.3 mg/dL (ref 1.7–2.4)

## 2017-11-07 LAB — POTASSIUM: POTASSIUM: 5.7 mmol/L — AB (ref 3.5–5.1)

## 2017-11-07 NOTE — Progress Notes (Signed)
Pulmonary Critical Care Medicine [facility] [place]   PULMONARY CRITICAL CARE SERVICE  PROGRESS NOTE  Date of Service: 11/07/2017  Glenda Walker  QMV:[id number]  DOB: November 29, 1955   DOA: 10/31/2017  Referring Physician: Carron Curie, MD  HPI: Glenda Walker is a 62 y.o. female seen for follow up of Acute on Chronic Respiratory Failure.  She is on T collar has been on 35% FiO2 has been having some issues with atrial fibrillation rapid response so therefore wean is not been consistent.  Medications: Reviewed on Rounds  Physical Exam:  Vitals: Temperature 98.8 pulse 116 respiratory rate 20 blood pressure 134/86 saturations 99%  Ventilator Settings off the ventilator on T collar FiO2 35%  . General: Comfortable at this time . Eyes: Grossly normal lids, irises & conjunctiva . ENT: grossly tongue is normal . Neck: no obvious mass . Cardiovascular: S1 S2 normal no gallop . Respiratory: Coarse breath sounds no rhonchi . Abdomen: soft . Skin: no rash seen on limited exam . Musculoskeletal: not rigid . Psychiatric:unable to assess . Neurologic: no seizure no involuntary movements         Lab Data:   Basic Metabolic Panel: Recent Labs  Lab 11/01/17 0521 11/02/17 0457 11/05/17 0514 11/07/17 0650  NA 138 138 134* 139  K 3.7 4.0 3.9 5.8*  CL 105 105 105 103  CO2 25 23 22 25   GLUCOSE 171* 192* 244* 135*  BUN 27* 28* 36* 44*  CREATININE 1.39* 1.30* 1.33* 1.72*  CALCIUM 9.6 9.7 9.5 10.0  MG 1.8 1.9 1.8 2.3  PHOS 2.9 3.5 3.1 4.2    Liver Function Tests: Recent Labs  Lab 11/01/17 0521 11/02/17 0457 11/07/17 0650  AST 20  --   --   ALT 15  --   --   ALKPHOS 139*  --   --   BILITOT 1.2  --   --   PROT 5.8*  --   --   ALBUMIN 2.2* 2.2* 2.3*   No results for input(s): LIPASE, AMYLASE in the last 168 hours. No results for input(s): AMMONIA in the last 168 hours.  CBC: Recent Labs  Lab 11/01/17 0521 11/02/17 0457 11/05/17 0514 11/07/17 0650  WBC  12.2* 10.7* 13.5* 14.8*  NEUTROABS 10.9*  --   --   --   HGB 9.5* 9.9* 9.1* 10.6*  HCT 30.9* 32.6* 29.8* 35.1*  MCV 93.6 94.2 96.4 96.2  PLT 313 291 261 276    Cardiac Enzymes: No results for input(s): CKTOTAL, CKMB, CKMBINDEX, TROPONINI in the last 168 hours.  BNP (last 3 results) No results for input(s): BNP in the last 8760 hours.  ProBNP (last 3 results) No results for input(s): PROBNP in the last 8760 hours.  Radiological Exams: Dg Abd Portable 1v  Result Date: 11/05/2017 CLINICAL DATA:  Ileus. EXAM: PORTABLE ABDOMEN - 1 VIEW COMPARISON:  One-view abdomen 11/01/2017 FINDINGS: Previously noted loops of small bowel are improved. The side port of the NGT is in the fundus of the stomach. Degenerative changes are noted in the lumbar spine. Clips are present along the fallopian tubes. IMPRESSION: 1. Improved bowel gas pattern significant distended bowel. 2. NG tube remains in the stomach. Electronically Signed   By: Marin Roberts M.D.   On: 11/05/2017 13:31    Assessment/Plan Principal Problem:   Acute on chronic respiratory failure with hypoxia (HCC) Active Problems:   Chronic respiratory failure with hypoxia (HCC)   Acute on chronic systolic CHF (congestive heart failure) (HCC)  Acute metabolic encephalopathy   Tracheostomy status (HCC)   Chronic atrial fibrillation (HCC)   Pleural effusion, right   1. Acute on chronic respiratory failure with hypoxia we will continue with T collar for now get her rate under better control. 2. Chronic atrial fibrillation rate has been elevated we will continue with supportive care monitor 3. Pleural effusion we will follow-up x-ray as necessary. 4. Acute on chronic systolic heart failure status. 5. Acute metabolic encephalopathy grossly unchanged we will follow along   I have personally seen and evaluated the patient, evaluated laboratory and imaging results, formulated the assessment and plan and placed orders. The Patient  requires high complexity decision making for assessment and support.  Case was discussed on Rounds with the Respiratory Therapy Staff  Yevonne PaxSaadat A Katlin Bortner, MD [facility] Pulmonary Critical Care Medicine Sleep Medicine \

## 2017-11-08 ENCOUNTER — Other Ambulatory Visit (HOSPITAL_COMMUNITY): Payer: Self-pay

## 2017-11-08 DIAGNOSIS — J9621 Acute and chronic respiratory failure with hypoxia: Secondary | ICD-10-CM | POA: Diagnosis not present

## 2017-11-08 DIAGNOSIS — G9341 Metabolic encephalopathy: Secondary | ICD-10-CM | POA: Diagnosis not present

## 2017-11-08 DIAGNOSIS — I5023 Acute on chronic systolic (congestive) heart failure: Secondary | ICD-10-CM | POA: Diagnosis not present

## 2017-11-08 DIAGNOSIS — I482 Chronic atrial fibrillation: Secondary | ICD-10-CM | POA: Diagnosis not present

## 2017-11-08 LAB — LACTIC ACID, PLASMA
LACTIC ACID, VENOUS: 1.8 mmol/L (ref 0.5–1.9)
Lactic Acid, Venous: 3 mmol/L (ref 0.5–1.9)
Lactic Acid, Venous: 2.1 mmol/L (ref 0.5–1.9)

## 2017-11-08 LAB — COMPREHENSIVE METABOLIC PANEL
ALK PHOS: 302 U/L — AB (ref 38–126)
ALT: 25 U/L (ref 0–44)
AST: 33 U/L (ref 15–41)
Albumin: 2.2 g/dL — ABNORMAL LOW (ref 3.5–5.0)
Anion gap: 11 (ref 5–15)
BUN: 55 mg/dL — ABNORMAL HIGH (ref 8–23)
CALCIUM: 9.8 mg/dL (ref 8.9–10.3)
CO2: 21 mmol/L — AB (ref 22–32)
CREATININE: 1.81 mg/dL — AB (ref 0.44–1.00)
Chloride: 105 mmol/L (ref 98–111)
GFR calc non Af Amer: 29 mL/min — ABNORMAL LOW (ref >60)
GFR, EST AFRICAN AMERICAN: 34 mL/min — AB (ref >60)
Glucose, Bld: 162 mg/dL — ABNORMAL HIGH (ref 70–99)
Potassium: 5.4 mmol/L — ABNORMAL HIGH (ref 3.5–5.1)
SODIUM: 137 mmol/L (ref 135–145)
Total Bilirubin: 1.1 mg/dL (ref 0.3–1.2)
Total Protein: 6.3 g/dL — ABNORMAL LOW (ref 6.5–8.1)

## 2017-11-08 LAB — AMMONIA: Ammonia: 31 umol/L (ref 9–35)

## 2017-11-08 LAB — POTASSIUM
Potassium: 4.7 mmol/L (ref 3.5–5.1)
Potassium: 4.6 mmol/L (ref 3.5–5.1)

## 2017-11-08 LAB — CBC
HCT: 37.2 % (ref 36.0–46.0)
Hemoglobin: 11.2 g/dL — ABNORMAL LOW (ref 12.0–15.0)
MCH: 28.9 pg (ref 26.0–34.0)
MCHC: 30.1 g/dL (ref 30.0–36.0)
MCV: 95.9 fL (ref 78.0–100.0)
Platelets: 283 10*3/uL (ref 150–400)
RBC: 3.88 MIL/uL (ref 3.87–5.11)
RDW: 23.1 % — AB (ref 11.5–15.5)
WBC: 12.7 10*3/uL — AB (ref 4.0–10.5)

## 2017-11-08 NOTE — Progress Notes (Signed)
[facility] [place]   PULMONARY CRITICAL CARE SERVICE  PROGRESS NOTE  Date of Service: 11/08/2017  Glenda Bunnellatricia A Stager  GEX:[id number]  DOB: 12/13/1955   DOA: 10/31/2017  Referring Physician: Carron CurieAli Hijazi, MD  HPI: Glenda Walker is a 62 y.o. female seen for follow up of Acute on Chronic Respiratory Failure.  Right now is on T collar has been on 40% oxygen trying to wean gradually the concern was that patient appeared to be septic so antibiotics have been started patient appears to be having a urinary tract infection  Medications: Reviewed on Rounds  Physical Exam:  Vitals: Temperature 98.0 pulse 71 respiratory rate 17 blood pressure 121/77 saturations 98%  Ventilator Settings currently off the ventilator on T collar 40% FiO2  . General: Comfortable at this time . Eyes: Grossly normal lids, irises & conjunctiva . ENT: grossly tongue is normal . Neck: no obvious mass . Cardiovascular: S1 S2 normal no gallop . Respiratory: No rhonchi or rales are noted at this time . Abdomen: soft . Skin: no rash seen on limited exam . Musculoskeletal: not rigid . Psychiatric:unable to assess . Neurologic: no seizure no involuntary movements         Lab Data:   Basic Metabolic Panel: Recent Labs  Lab 11/02/17 0457 11/05/17 0514 11/07/17 0650 11/07/17 2011 11/08/17 0530  NA 138 134* 139  --  137  K 4.0 3.9 5.8* 5.7* 5.4*  CL 105 105 103  --  105  CO2 23 22 25   --  21*  GLUCOSE 192* 244* 135*  --  162*  BUN 28* 36* 44*  --  55*  CREATININE 1.30* 1.33* 1.72*  --  1.81*  CALCIUM 9.7 9.5 10.0  --  9.8  MG 1.9 1.8 2.3  --   --   PHOS 3.5 3.1 4.2  --   --     Liver Function Tests: Recent Labs  Lab 11/02/17 0457 11/07/17 0650 11/08/17 0530  AST  --   --  33  ALT  --   --  25  ALKPHOS  --   --  302*  BILITOT  --   --  1.1  PROT  --   --  6.3*  ALBUMIN 2.2* 2.3* 2.2*   No results for input(s): LIPASE, AMYLASE in the last 168  hours. Recent Labs  Lab 11/08/17 0530  AMMONIA 31    CBC: Recent Labs  Lab 11/02/17 0457 11/05/17 0514 11/07/17 0650 11/08/17 0530  WBC 10.7* 13.5* 14.8* 12.7*  HGB 9.9* 9.1* 10.6* 11.2*  HCT 32.6* 29.8* 35.1* 37.2  MCV 94.2 96.4 96.2 95.9  PLT 291 261 276 283    Cardiac Enzymes: No results for input(s): CKTOTAL, CKMB, CKMBINDEX, TROPONINI in the last 168 hours.  BNP (last 3 results) No results for input(s): BNP in the last 8760 hours.  ProBNP (last 3 results) No results for input(s): PROBNP in the last 8760 hours.  Radiological Exams: No results found.  Assessment/Plan Principal Problem:   Acute on chronic respiratory failure with hypoxia (HCC) Active Problems:   Chronic respiratory failure with hypoxia (HCC)   Acute on chronic systolic CHF (congestive heart failure) (HCC)   Acute metabolic encephalopathy   Tracheostomy status (HCC)   Chronic atrial fibrillation (HCC)   Pleural effusion, right   1. Acute on chronic respiratory failure with hypoxia titrate oxygen down as tolerated.  We will continue with T bar continue secretion management and aggressive pulmonary toilet. 2.  Acute on chronic systolic heart failure appears to be compensated follow fluid status very closely. 3. Chronic atrial fibrillation rate is controlled we will continue with present management. 4. Metabolic encephalopathy may be worsened by sepsis.  We will continue with present therapy. 5. Sepsis being treated for urinary tract infection we will continue with supportive care.   I have personally seen and evaluated the patient, evaluated laboratory and imaging results, formulated the assessment and plan and placed orders. The Patient requires high complexity decision making for assessment and support.  Case was discussed on Rounds with the Respiratory Therapy Staff  Yevonne Pax, MD [facility] Pulmonary Critical Care Medicine Sleep Medicine

## 2017-11-09 ENCOUNTER — Other Ambulatory Visit (HOSPITAL_COMMUNITY): Payer: Self-pay

## 2017-11-09 DIAGNOSIS — I5023 Acute on chronic systolic (congestive) heart failure: Secondary | ICD-10-CM | POA: Diagnosis not present

## 2017-11-09 DIAGNOSIS — J9621 Acute and chronic respiratory failure with hypoxia: Secondary | ICD-10-CM | POA: Diagnosis not present

## 2017-11-09 DIAGNOSIS — G9341 Metabolic encephalopathy: Secondary | ICD-10-CM | POA: Diagnosis not present

## 2017-11-09 DIAGNOSIS — I482 Chronic atrial fibrillation: Secondary | ICD-10-CM | POA: Diagnosis not present

## 2017-11-09 LAB — BASIC METABOLIC PANEL
Anion gap: 12 (ref 5–15)
BUN: 56 mg/dL — AB (ref 8–23)
CHLORIDE: 103 mmol/L (ref 98–111)
CO2: 23 mmol/L (ref 22–32)
CREATININE: 1.75 mg/dL — AB (ref 0.44–1.00)
Calcium: 9.6 mg/dL (ref 8.9–10.3)
GFR, EST AFRICAN AMERICAN: 35 mL/min — AB (ref >60)
GFR, EST NON AFRICAN AMERICAN: 30 mL/min — AB (ref >60)
Glucose, Bld: 121 mg/dL — ABNORMAL HIGH (ref 70–99)
POTASSIUM: 4.5 mmol/L (ref 3.5–5.1)
SODIUM: 138 mmol/L (ref 135–145)

## 2017-11-09 LAB — LACTIC ACID, PLASMA
LACTIC ACID, VENOUS: 2.3 mmol/L — AB (ref 0.5–1.9)
LACTIC ACID, VENOUS: 1.6 mmol/L (ref 0.5–1.9)
LACTIC ACID, VENOUS: 2 mmol/L — AB (ref 0.5–1.9)

## 2017-11-09 NOTE — Progress Notes (Signed)
Pulmonary Critical Care Medicine [facility] [place]   PULMONARY CRITICAL CARE SERVICE  PROGRESS NOTE  Date of Service: 11/09/2017  Glenda Walker  [id number]:[id number]  DOB: 1955/11/05   DOA: 10/31/2017  Referring Physician: Carron Curie, MD  HPI: Glenda Walker is a 62 y.o. female seen for follow up of Acute on Chronic Respiratory Failure.  At this time patient is on T collar secretions are minimal.  She is on Rocephin for urinary tract infection.  Currently comfortable  Medications: Reviewed on Rounds  Physical Exam:  Vitals: Temperature 97.9 pulse 80 respiratory rate 22 blood pressure 138/99 saturations 98%  Ventilator Settings on T collar with 40% FiO2  . General: Comfortable at this time . Eyes: Grossly normal lids, irises & conjunctiva . ENT: grossly tongue is normal . Neck: no obvious mass . Cardiovascular: S1 S2 normal no gallop . Respiratory: Coarse breath sounds noted bilaterally . Abdomen: soft . Skin: no rash seen on limited exam . Musculoskeletal: not rigid . Psychiatric:unable to assess . Neurologic: no seizure no involuntary movements         Lab Data:   Basic Metabolic Panel: Recent Labs  Lab 11/05/17 0514 11/07/17 0650 11/07/17 2011 11/08/17 0530 11/08/17 1822 11/08/17 2106 11/08/17 2356  NA 134* 139  --  137  --   --  138  K 3.9 5.8* 5.7* 5.4* 4.7 4.6 4.5  CL 105 103  --  105  --   --  103  CO2 22 25  --  21*  --   --  23  GLUCOSE 244* 135*  --  162*  --   --  121*  BUN 36* 44*  --  55*  --   --  56*  CREATININE 1.33* 1.72*  --  1.81*  --   --  1.75*  CALCIUM 9.5 10.0  --  9.8  --   --  9.6  MG 1.8 2.3  --   --   --   --   --   PHOS 3.1 4.2  --   --   --   --   --     Liver Function Tests: Recent Labs  Lab 11/07/17 0650 11/08/17 0530  AST  --  33  ALT  --  25  ALKPHOS  --  302*  BILITOT  --  1.1  PROT  --  6.3*  ALBUMIN 2.3* 2.2*   No results for input(s): LIPASE, AMYLASE in the last 168 hours. Recent Labs  Lab  11/08/17 0530  AMMONIA 31    CBC: Recent Labs  Lab 11/05/17 0514 11/07/17 0650 11/08/17 0530  WBC 13.5* 14.8* 12.7*  HGB 9.1* 10.6* 11.2*  HCT 29.8* 35.1* 37.2  MCV 96.4 96.2 95.9  PLT 261 276 283    Cardiac Enzymes: No results for input(s): CKTOTAL, CKMB, CKMBINDEX, TROPONINI in the last 168 hours.  BNP (last 3 results) No results for input(s): BNP in the last 8760 hours.  ProBNP (last 3 results) No results for input(s): PROBNP in the last 8760 hours.  Radiological Exams: US Renal  Result Date: 11/08/2017 CLINICAL DATA:  Acute renal injury EXAM: RENAL / URINARY TRACT ULTRASOUND COMPLETE COMPARISON:  None. FINDINGS: Right Kidney: Length: 10.7 cm. No hydronephrosis is noted. Cortical calcification is noted measuring 3 mm in the midportion of the kidney. Left Kidney: Length: 9.8 cm. Echogenicity within normal limits. No mass or hydronephrosis visualized. Bladder: Decompressed by Foley catheter. Changes of ascites and left-sided pleural effusion are  noted. IMPRESSION: No acute hydronephrosis is noted. Ascites and left pleural effusion. Electronically Signed   By: Alcide CleverMark  Lukens M.D.   On: 11/08/2017 20:15   Dg Chest Port 1 View  Result Date: 11/09/2017 CLINICAL DATA:  Shortness of breath. EXAM: PORTABLE CHEST 1 VIEW COMPARISON:  Radiograph October 31, 2017. FINDINGS: Stable cardiomegaly. Tracheostomy tube is unchanged in position. Left internal jugular catheter is unchanged in position. Nasogastric tube is unchanged in position. No pneumothorax is noted. Stable bibasilar opacities are noted concerning for edema, atelectasis or pneumonia with associated pleural effusions. Bony thorax is unremarkable. IMPRESSION: Stable support apparatus. Stable bilateral lung opacities as described above. Electronically Signed   By: Lupita RaiderJames  Green Jr, M.D.   On: 11/09/2017 13:18    Assessment/Plan Principal Problem:   Acute on chronic respiratory failure with hypoxia (HCC) Active Problems:   Chronic  respiratory failure with hypoxia (HCC)   Acute on chronic systolic CHF (congestive heart failure) (HCC)   Acute metabolic encephalopathy   Tracheostomy status (HCC)   Chronic atrial fibrillation (HCC)   Pleural effusion, right   1. Acute on chronic respiratory failure with hypoxia we will continue with T collar and wean as tolerated patient remains on 40% FiO2 continue pulmonary toilet 2. Chronic systolic heart failure we will continue with supportive care monitor fluid status. 3. Acute metabolic encephalopathy grossly unchanged 4. Chronic atrial fibrillation rate is controlled we will follow   I have personally seen and evaluated the patient, evaluated laboratory and imaging results, formulated the assessment and plan and placed orders. The Patient requires high complexity decision making for assessment and support.  Case was discussed on Rounds with the Respiratory Therapy Staff  Yevonne PaxSaadat A Amore Ackman, MD [id number] Pulmonary Critical Care Medicine Sleep Medicine

## 2017-11-10 ENCOUNTER — Other Ambulatory Visit (HOSPITAL_COMMUNITY): Payer: Self-pay

## 2017-11-10 DIAGNOSIS — I5023 Acute on chronic systolic (congestive) heart failure: Secondary | ICD-10-CM | POA: Diagnosis not present

## 2017-11-10 DIAGNOSIS — I482 Chronic atrial fibrillation: Secondary | ICD-10-CM | POA: Diagnosis not present

## 2017-11-10 DIAGNOSIS — G9341 Metabolic encephalopathy: Secondary | ICD-10-CM | POA: Diagnosis not present

## 2017-11-10 DIAGNOSIS — J9621 Acute and chronic respiratory failure with hypoxia: Secondary | ICD-10-CM | POA: Diagnosis not present

## 2017-11-10 LAB — MAGNESIUM: MAGNESIUM: 2.1 mg/dL (ref 1.7–2.4)

## 2017-11-10 LAB — PROTIME-INR
INR: 1.23
Prothrombin Time: 15.4 seconds — ABNORMAL HIGH (ref 11.4–15.2)

## 2017-11-10 LAB — CBC WITH DIFFERENTIAL/PLATELET
BASOS PCT: 0 %
Basophils Absolute: 0 10*3/uL (ref 0.0–0.1)
EOS PCT: 1 %
Eosinophils Absolute: 0.1 10*3/uL (ref 0.0–0.7)
HEMATOCRIT: 35.1 % — AB (ref 36.0–46.0)
Hemoglobin: 10.6 g/dL — ABNORMAL LOW (ref 12.0–15.0)
LYMPHS ABS: 1.1 10*3/uL (ref 0.7–4.0)
Lymphocytes Relative: 9 %
MCH: 29.3 pg (ref 26.0–34.0)
MCHC: 30.2 g/dL (ref 30.0–36.0)
MCV: 97 fL (ref 78.0–100.0)
MONO ABS: 0.9 10*3/uL (ref 0.1–1.0)
MONOS PCT: 7 %
Neutro Abs: 10.3 10*3/uL — ABNORMAL HIGH (ref 1.7–7.7)
Neutrophils Relative %: 83 %
PLATELETS: 213 10*3/uL (ref 150–400)
RBC: 3.62 MIL/uL — AB (ref 3.87–5.11)
RDW: 22.3 % — AB (ref 11.5–15.5)
WBC: 12.4 10*3/uL — AB (ref 4.0–10.5)

## 2017-11-10 LAB — BASIC METABOLIC PANEL
Anion gap: 9 (ref 5–15)
BUN: 54 mg/dL — AB (ref 8–23)
CO2: 26 mmol/L (ref 22–32)
CREATININE: 1.79 mg/dL — AB (ref 0.44–1.00)
Calcium: 9.5 mg/dL (ref 8.9–10.3)
Chloride: 106 mmol/L (ref 98–111)
GFR calc non Af Amer: 29 mL/min — ABNORMAL LOW (ref >60)
GFR, EST AFRICAN AMERICAN: 34 mL/min — AB (ref >60)
Glucose, Bld: 106 mg/dL — ABNORMAL HIGH (ref 70–99)
Potassium: 4.1 mmol/L (ref 3.5–5.1)
SODIUM: 141 mmol/L (ref 135–145)

## 2017-11-10 LAB — CULTURE, RESPIRATORY

## 2017-11-10 LAB — LACTIC ACID, PLASMA: LACTIC ACID, VENOUS: 1.4 mmol/L (ref 0.5–1.9)

## 2017-11-10 LAB — CULTURE, RESPIRATORY W GRAM STAIN: Culture: NORMAL

## 2017-11-10 MED ORDER — CEFAZOLIN SODIUM-DEXTROSE 2-4 GM/100ML-% IV SOLN: 2.0000 g | INTRAVENOUS | Status: AC

## 2017-11-10 NOTE — Consult Note (Signed)
Chief Complaint: Patient was seen in consultation today for dysphagia  Referring Physician(s): Dr. Sharyon MedicusHijazi  Supervising Physician: [name], John  [facility]  History of Present Illness: Glenda Walker is a 62 y.o. female with past medical history of DM, HTN, HLD, CAD s/p CABG, a fib who presented to OSH with respiratory failure and hepatic encephalopathy.  Patient now s/p trach placement and transferred [facility] for ongoing care.  IR consulted for gastrostomy tube placement for ongoing dysphagia and long-term care.   CT Abdomen obtained overnight showed: No CT evidence of acute intra-abdominal pathology. Diffuse body wall and mesenteric anasarca with right greater than left pleural effusions and trace ascites. Recommend correlation with fluid status. Cholelithiasis with partially stented gallbladder. If there is concern for biliary colic, correlation with physical exam and lab values may be useful. Diffuse mild colonic wall thickening, favored to represent changes related to the patient's positive fluid status and/or hypoalbuminemia. Ischemia is not excluded.  Of note, patient experienced bradycardia during CT scan and again on unit after return from CT scan.  Cardiology consult has been requested by primary team.   Past Medical History:  Diagnosis Date  . Acute metabolic encephalopathy 11/01/2017  . Acute on chronic respiratory failure with hypoxia (HCC) 11/01/2017  . Acute on chronic systolic CHF (congestive heart failure) (HCC) 11/01/2017  . Chronic atrial fibrillation (HCC)   . Chronic respiratory failure with hypoxia (HCC) 11/01/2017  . Coronary artery disease   . Diabetes type 2, uncontrolled (HCC)   . Hyperlipidemia   . Hypertension   . Hypothyroidism   . Pleural effusion, right   . Stroke (cerebrum) (HCC)   . 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 AORTIC VALVE REPLACEMENT  (AVR)/CORONARY ARTERY BYPASS GRAFTING (CABG)    . CORONARY ARTERY BYPASS GRAFT    . TRACHEOSTOMY      Allergies: Patient has no allergy information on record.  Medications: Prior to Admission medications   Not on File     Family History  Family history unknown: Yes    Social History   Socioeconomic History  . Marital status: Married    Spouse name: Not on file  . Number of children: Not on file  . Years of education: Not on file  . Highest education level: Not on file  Occupational History  . Not on file  Social Needs  . Financial resource strain: Not on file  . Food insecurity:    Worry: Not on file    Inability: Not on file  . Transportation needs:    Medical: Not on file    Non-medical: Not on file  Tobacco Use  . Smoking status: Unknown If Ever Smoked  . Smokeless tobacco: Never Used  Substance and Sexual Activity  . Alcohol use: Not Currently  . Drug use: Not Currently  . Sexual activity: Not Currently  Lifestyle  . Physical activity:    Days per week: Not on file    Minutes per session: Not on file  . Stress: Not on file  Relationships  . Social connections:    Talks on phone: Not on file    Gets together: Not on file    Attends religious service: Not on file    Active member of club or organization: Not on file    Attends meetings of clubs or organizations: Not on file    Relationship status: Not on file  Other  Topics Concern  . Not on file  Social History Narrative  . Not on file     Review of Systems: A 12 point ROS discussed and pertinent positives are indicated in the HPI above.  All other systems are negative.  Review of Systems  Unable to perform ROS: Acuity of condition    Vital Signs: LMP  (LMP Unknown)   Physical Exam  Constitutional: She is oriented to person, place, and time. She appears well-developed. No distress.  Cardiovascular: Normal rate, regular rhythm and normal heart sounds. Exam reveals no gallop and no friction  rub.  No murmur heard. Pulmonary/Chest: Effort normal and breath sounds normal. No respiratory distress. She has no wheezes.  Abdominal: Soft. She exhibits no distension. There is no tenderness. There is no guarding.  Neurological: She is alert and oriented to person, place, and time.  Skin: Skin is warm and dry. She is not diaphoretic.  Psychiatric: She has a normal mood and affect. Her behavior is normal. Judgment and thought content normal.  Nursing note and vitals reviewed.    MD Evaluation Airway: WNL Heart: WNL Abdomen: WNL Chest/ Lungs: WNL ASA  Classification: 3 Mallampati/Airway Score: Two   Imaging: Ct Abdomen Wo Contrast  Result Date: 11/10/2017 CLINICAL DATA:  62 year old female with a history of acute respiratory illness EXAM: CT ABDOMEN WITHOUT CONTRAST TECHNIQUE: Multidetector CT imaging of the abdomen was performed following the standard protocol without IV contrast. COMPARISON:  No prior abdominal CT FINDINGS: Lower chest: Body wall anasarca/edema. Right greater than left pleural effusion with associated atelectasis/consolidation. Partially imaged cardiomegaly with partially imaged surgical changes of prior median sternotomy. Hepatobiliary: Uniform attenuation of liver parenchyma. Gallbladder somewhat distended with no significant gallbladder wall thickening. Radiopaque gallstone in the neck of the gallbladder. No evidence of intrahepatic or extrahepatic biliary ductal dilatation. Pancreas: Unremarkable pancreas Spleen: Unremarkable spleen Adrenals/Urinary Tract: Unremarkable adrenal glands. Perinephric stranding bilaterally. No evidence of hydronephrosis or nephrolithiasis. Stomach/Bowel: Gastric tube terminates within the stomach lumen which is decompressed. Typical anatomy/relationship of the transverse colon and stomach. Small bowel nondistended. Enteric contrast within the colon which is nondistended. Diffuse colonic wall thickening. Vascular/Lymphatic: Atherosclerotic  calcifications of the abdominal aorta and the proximal iliac arteries. No lymphadenopathy. Other: Diffuse stranding within the fat of the mesentery with free fluid in the pericolic gutters and trace ascites adjacent to liver. Circumferential body wall edema/anasarca. Musculoskeletal: No acute displaced fracture. Degenerative changes of the spine. IMPRESSION: No CT evidence of acute intra-abdominal pathology. Diffuse body wall and mesenteric anasarca with right greater than left pleural effusions and trace ascites. Recommend correlation with fluid status. Cholelithiasis with partially stented gallbladder. If there is concern for biliary colic, correlation with physical exam and lab values may be useful. Diffuse mild colonic wall thickening, favored to represent changes related to the patient's positive fluid status and/or hypoalbuminemia. Ischemia is not excluded. Aortic Atherosclerosis (ICD10-I70.0). Electronically Signed   By: Gilmer Mor D.O.   On: 11/10/2017 08:01   US Renal  Result Date: 11/08/2017 CLINICAL DATA:  Acute renal injury EXAM: RENAL / URINARY TRACT ULTRASOUND COMPLETE COMPARISON:  None. FINDINGS: Right Kidney: Length: 10.7 cm. No hydronephrosis is noted. Cortical calcification is noted measuring 3 mm in the midportion of the kidney. Left Kidney: Length: 9.8 cm. Echogenicity within normal limits. No mass or hydronephrosis visualized. Bladder: Decompressed by Foley catheter. Changes of ascites and left-sided pleural effusion are noted. IMPRESSION: No acute hydronephrosis is noted. Ascites and left pleural effusion. Electronically Signed   By: Loraine Leriche  Lukens M.D.   On: 11/08/2017 20:15   Dg Chest Port 1 View  Result Date: 11/09/2017 CLINICAL DATA:  Shortness of breath. EXAM: PORTABLE CHEST 1 VIEW COMPARISON:  Radiograph October 31, 2017. FINDINGS: Stable cardiomegaly. Tracheostomy tube is unchanged in position. Left internal jugular catheter is unchanged in position. Nasogastric tube is unchanged  in position. No pneumothorax is noted. Stable bibasilar opacities are noted concerning for edema, atelectasis or pneumonia with associated pleural effusions. Bony thorax is unremarkable. IMPRESSION: Stable support apparatus. Stable bilateral lung opacities as described above. Electronically Signed   By: Lupita Raider, M.D.   On: 11/09/2017 13:18   Dg Chest Port 1 View  Result Date: 10/31/2017 CLINICAL DATA:  Respiratory failure. EXAM: PORTABLE CHEST 1 VIEW COMPARISON:  None. FINDINGS: Tracheostomy tube projects 4.3 cm above the carina. LEFT PICC distal tip projects in mid superior vena cava. Nasogastric tube tip projecting in proximal stomach. Status post median sternotomy and pulmonary clipping. Moderate cardiomegaly. Pulmonary vascular congestion. Patchy RIGHT upper lobe and retrocardiac consolidation. Small pleural effusions. No pneumothorax. Surgical clips RIGHT chest wall, potentially within the breast. Osseous structures are non suspicious. IMPRESSION: 1. Tracheostomy tube projects 4.3 cm above the carina. LEFT PICC distal tip projects in mid superior vena cava. Nasogastric tube tip projecting in proximal stomach. 2. Cardiomegaly and pulmonary vascular congestion. Small pleural effusions. 3. RIGHT upper lobe and retrocardiac airspace opacities concerning for pneumonia. Followup PA and lateral chest X-ray is recommended in 3-4 weeks following trial of antibiotic therapy to ensure resolution and exclude underlying malignancy. Electronically Signed   By: Awilda Metro M.D.   On: 10/31/2017 19:49   Dg Abd Portable 1v  Result Date: 11/05/2017 CLINICAL DATA:  Ileus. EXAM: PORTABLE ABDOMEN - 1 VIEW COMPARISON:  One-view abdomen 11/01/2017 FINDINGS: Previously noted loops of small bowel are improved. The side port of the NGT is in the fundus of the stomach. Degenerative changes are noted in the lumbar spine. Clips are present along the fallopian tubes. IMPRESSION: 1. Improved bowel gas pattern  significant distended bowel. 2. NG tube remains in the stomach. Electronically Signed   By: Marin Roberts M.D.   On: 11/05/2017 13:31   Dg Abd Portable 1v  Result Date: 11/01/2017 CLINICAL DATA:  NG tube placement. EXAM: PORTABLE ABDOMEN - 1 VIEW COMPARISON:  Single-view of the abdomen 10/31/2016. FINDINGS: NG tube tip and side port project in the stomach. IMPRESSION: As above. Electronically Signed   By: Drusilla Kanner M.D.   On: 11/01/2017 18:56   Dg Abd Portable 1v  Result Date: 10/31/2017 CLINICAL DATA:  Ileus. EXAM: PORTABLE ABDOMEN - 1 VIEW COMPARISON:  None. FINDINGS: Nasogastric tube tip projects in proximal stomach. Gas-filled mildly distended small bowel in the included abdomen. Paucity of large bowel gas. Central displacement of bowel loops. Included soft tissue planes and osseous structures are non suspicious. IMPRESSION: Nasogastric tube tip projects in proximal stomach. Mild gas distended small bowel seen with ileus or early small bowel obstruction. Central displacement of bowel concerning for ascites. Electronically Signed   By: Awilda Metro M.D.   On: 10/31/2017 19:51    Labs:  CBC: Recent Labs    11/05/17 0514 11/07/17 0650 11/08/17 0530 11/10/17 0944  WBC 13.5* 14.8* 12.7* 12.4*  HGB 9.1* 10.6* 11.2* 10.6*  HCT 29.8* 35.1* 37.2 35.1*  PLT 261 276 283 213    COAGS: Recent Labs    11/01/17 0521 11/10/17 0944  INR 1.15 1.23    BMP: Recent Labs  11/07/17 0650  11/08/17 0530 11/08/17 1822 11/08/17 2106 11/08/17 2356 11/10/17 0449  NA 139  --  137  --   --  138 141  K 5.8*   < > 5.4* 4.7 4.6 4.5 4.1  CL 103  --  105  --   --  103 106  CO2 25  --  21*  --   --  23 26  GLUCOSE 135*  --  162*  --   --  121* 106*  BUN 44*  --  55*  --   --  56* 54*  CALCIUM 10.0  --  9.8  --   --  9.6 9.5  CREATININE 1.72*  --  1.81*  --   --  1.75* 1.79*  GFRNONAA 31*  --  29*  --   --  30* 29*  GFRAA 36*  --  34*  --   --  35* 34*   < > = values in this  interval not displayed.    LIVER FUNCTION TESTS: Recent Labs    11/01/17 0521 11/02/17 0457 11/07/17 0650 11/08/17 0530  BILITOT 1.2  --   --  1.1  AST 20  --   --  33  ALT 15  --   --  25  ALKPHOS 139*  --   --  302*  PROT 5.8*  --   --  6.3*  ALBUMIN 2.2* 2.2* 2.3* 2.2*    TUMOR MARKERS: No results for input(s): AFPTM, CEA, CA199, CHROMGRNA in the last 8760 hours.  Assessment and Plan: Dysphagia, long-term care Patient with respiratory failure s/p trach placement with ongoing encephalopathy and inability to eat and drink PO.  She is currently receiving tube feeds via NGT.  CT Abdomen reviewed by Dr. Loreta Ave who approves patient for percutaneous approach at gastrostomy tube placement.   CT also showed distended gall bladder with evidence of gall stones.  Repeat WBC today is stable at 12.4. Patient has no clinical signs of RUQ pain- no grimace, tenderness, guarding. Appears to be tolerating TFs. Her lactic acid has normalized from recent sepsis. She continues Rocephin IV as well as rifaximin.  She is currently on SQ heparin.  Will await cardiology evaluation prior to proceeding with gastrostomy tube placement. IR following.     Thank you for this interesting consult.  I greatly enjoyed meeting LAELYN BLUMENTHAL and look forward to participating in their care.  A copy of this report was sent to the requesting provider on this date.  Electronically Signed: Hoyt Koch, PA 11/10/2017, 11:16 AM   I spent a total of 40 Minutes    in face to face in clinical consultation, greater than 50% of which was counseling/coordinating care for dysphagia.

## 2017-11-10 NOTE — Consult Note (Signed)
Referring Physician:  [name] is an 62 y.o. female.                       Chief Complaint: Bradycardia  HPI: 62 year old female has PMH of Chronic systolic left heart failure, CAD, S/P CABG, Paroxysmal atrial fibrillation, hypothyroidism, Stroke, Hypertension, type 2 DM, Chronic Respiratory failure, severe hypoalbuminemia, CKD, III, UTI with possible sepsis and hepatic encephalopathy.  She had few brief and asymptomatic episodes of atrial fibrillation with high grade AV block with ventricular rate in 30's.  Patient communicates with nodding head and remains confined to bed with limited left sided movement and no movement of right upper and lower extremities.  She is off Metoprolol now with stable vital signs and heart rate in 80's.  Past Medical History:  Diagnosis Date  . Acute metabolic encephalopathy 0/24/0973  . Acute on chronic respiratory failure with hypoxia ([place]) 11/01/2017  . Acute on chronic systolic CHF (congestive heart failure) ([place]) 11/01/2017  . Chronic atrial fibrillation ([place])   . Chronic respiratory failure with hypoxia ([place]) 11/01/2017  . Coronary artery disease   . Diabetes type 2, uncontrolled ([place])   . Hyperlipidemia   . Hypertension   . Hypothyroidism   . Pleural effusion, right   . Stroke (cerebrum) ([place])   . 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 AORTIC VALVE REPLACEMENT (AVR)/CORONARY ARTERY BYPASS GRAFTING (CABG)    . CORONARY ARTERY BYPASS GRAFT    . TRACHEOSTOMY      Family History  Family history unknown: Yes   Social History:  has an unknown smoking status. She has never used smokeless tobacco. She reports that she drank alcohol. She reports that she has current or past drug history.  Allergies: Allergies not on file  Medications:  Saline-50cc/hr IV. Rocephin 2 gm IV daily Heparin 5000 SQ q 8 hr. Fish Oil 1600 mg/NG daily Lantus Insulin 20 units SQ daily. Levothyroxine 150  mg./NG daily Melatonin 3 mg. q HS. Metoclopramide 5 mg./NG q HS. Metoprolol- off Miralax 17 gm/NG daily. Modafinil 100 mg/NG q d. Pantoprazole suspension 40 mg./NG q d. Paroxetine 20 mg/NG q d. Simethicone 80 mg. PO qid Xifaxin 550 mg. Tab/NG bid.   Results for orders placed or performed during the hospital encounter of 10/31/17 (from the past 48 hour(s))  Lactic acid, plasma     Status: None   Collection Time: 11/08/17  6:22 PM  Result Value Ref Range   Lactic Acid, Venous 1.8 0.5 - 1.9 mmol/L    Comment: Performed at [facility], [address], [place], [place] [address]  Potassium     Status: None   Collection Time: 11/08/17  6:22 PM  Result Value Ref Range   Potassium 4.7 3.5 - 5.1 mmol/L    Comment: Performed at [facility], [place] [address] [place], [place] [address]  Potassium     Status: None   Collection Time: 11/08/17  9:06 PM  Result Value Ref Range   Potassium 4.6 3.5 - 5.1 mmol/L    Comment: Performed at [facility] [address] [place], [place] [address]  Lactic acid, plasma     Status: Abnormal   Collection Time: 11/08/17 11:56 PM  Result Value Ref Range   Lactic Acid, Venous 2.3 (HH) 0.5 - 1.9 mmol/L    Comment: CRITICAL RESULT CALLED TO, READ BACK BY AND VERIFIED WITH:  TATA,H RN 11/09/2017 0109 JORDANS Performed at [facility], [place] [address], [place], [place] [address]   Basic metabolic panel     Status: Abnormal   Collection Time: 11/08/17 11:56 PM  Result Value Ref Range   Sodium 138 135 - 145 mmol/L   Potassium 4.5 3.5 - 5.1 mmol/L   Chloride 103 98 - 111 mmol/L   CO2 23 22 - 32 mmol/L   Glucose, Bld 121 (H) 70 - 99 mg/dL   BUN 56 (H) 8 - 23 mg/dL   Creatinine, Ser 1.75 (H) 0.44 - 1.00 mg/dL   Calcium 9.6 8.9 - 10.3 mg/dL   GFR calc non Af Amer 30 (L) >60 mL/min   GFR calc Af Amer 35 (L) >60 mL/min    Comment: (NOTE) The eGFR has been calculated using the CKD EPI equation. This calculation has not been validated in  all clinical situations. eGFR's persistently <60 mL/min signify possible Chronic Kidney Disease.    Anion gap 12 5 - 15    Comment: Performed at [facility] [address] [place], [place] [address]  Lactic acid, plasma     Status: Abnormal   Collection Time: 11/09/17  2:57 PM  Result Value Ref Range   Lactic Acid, Venous 2.0 (HH) 0.5 - 1.9 mmol/L    Comment: CRITICAL RESULT CALLED TO, READ BACK BY AND VERIFIED WITH: C GILLESPIE,RN 1534 11/09/17 D BRADLEY Performed at [facility] [address] [place], [place] [address]   Lactic acid, plasma     Status: None   Collection Time: 11/09/17  5:34 PM  Result Value Ref Range   Lactic Acid, Venous 1.6 0.5 - 1.9 mmol/L    Comment: Performed at [facility] [address] [place], [place] [address]  Basic metabolic panel     Status: Abnormal   Collection Time: 11/10/17  4:49 AM  Result Value Ref Range   Sodium 141 135 - 145 mmol/L   Potassium 4.1 3.5 - 5.1 mmol/L   Chloride 106 98 - 111 mmol/L   CO2 26 22 - 32 mmol/L   Glucose, Bld 106 (H) 70 - 99 mg/dL   BUN 54 (H) 8 - 23 mg/dL   Creatinine, Ser 1.79 (H) 0.44 - 1.00 mg/dL   Calcium 9.5 8.9 - 10.3 mg/dL   GFR calc non Af Amer 29 (L) >60 mL/min   GFR calc Af Amer 34 (L) >60 mL/min    Comment: (NOTE) The eGFR has been calculated using the CKD EPI equation. This calculation has not been validated in all clinical situations. eGFR's persistently <60 mL/min signify possible Chronic Kidney Disease.    Anion gap 9 5 - 15    Comment: Performed at [facility] [address], [place], [place] [address]  Magnesium     Status: None   Collection Time: 11/10/17  4:49 AM  Result Value Ref Range   Magnesium 2.1 1.7 - 2.4 mg/dL    Comment: Performed at [facility] [address], [place], [place] [address]  Lactic acid, plasma     Status: None   Collection Time: 11/10/17  5:50 AM  Result Value Ref Range   Lactic Acid, Venous 1.4 0.5 - 1.9 mmol/L     Comment: Performed at [facility] [address] [place], [place] [address]  Protime-INR     Status: Abnormal   Collection Time: 11/10/17  9:44 AM  Result Value Ref  Range   Prothrombin Time 15.4 (H) 11.4 - 15.2 seconds   INR 1.23     Comment: Performed at [facility], [place] [address] [place], [place] [address]  CBC with Differential/Platelet     Status: Abnormal   Collection Time: 11/10/17  9:44 AM  Result Value Ref Range   WBC 12.4 (H) 4.0 - 10.5 K/uL   RBC 3.62 (L) 3.87 - 5.11 MIL/uL   Hemoglobin 10.6 (L) 12.0 - 15.0 g/dL   HCT 35.1 (L) 36.0 - 46.0 %   MCV 97.0 78.0 - 100.0 fL   MCH 29.3 26.0 - 34.0 pg   MCHC 30.2 30.0 - 36.0 g/dL   RDW 22.3 (H) 11.5 - 15.5 %   Platelets 213 150 - 400 K/uL   Neutrophils Relative % 83 %   Lymphocytes Relative 9 %   Monocytes Relative 7 %   Eosinophils Relative 1 %   Basophils Relative 0 %   Neutro Abs 10.3 (H) 1.7 - 7.7 K/uL   Lymphs Abs 1.1 0.7 - 4.0 K/uL   Monocytes Absolute 0.9 0.1 - 1.0 K/uL   Eosinophils Absolute 0.1 0.0 - 0.7 K/uL   Basophils Absolute 0.0 0.0 - 0.1 K/uL   RBC Morphology POLYCHROMASIA PRESENT     Comment: STOMATOCYTES Performed at [facility] [address], [place], [place] [address]    Ct Abdomen Wo Contrast  Result Date: 11/10/2017 CLINICAL DATA:  62 year old female with a history of acute respiratory illness EXAM: CT ABDOMEN WITHOUT CONTRAST TECHNIQUE: Multidetector CT imaging of the abdomen was performed following the standard protocol without IV contrast. COMPARISON:  No prior abdominal CT FINDINGS: Lower chest: Body wall anasarca/edema. Right greater than left pleural effusion with associated atelectasis/consolidation. Partially imaged cardiomegaly with partially imaged surgical changes of prior median sternotomy. Hepatobiliary: Uniform attenuation of liver parenchyma. Gallbladder somewhat distended with no significant gallbladder wall thickening. Radiopaque gallstone in the neck of the  gallbladder. No evidence of intrahepatic or extrahepatic biliary ductal dilatation. Pancreas: Unremarkable pancreas Spleen: Unremarkable spleen Adrenals/Urinary Tract: Unremarkable adrenal glands. Perinephric stranding bilaterally. No evidence of hydronephrosis or nephrolithiasis. Stomach/Bowel: Gastric tube terminates within the stomach lumen which is decompressed. Typical anatomy/relationship of the transverse colon and stomach. Small bowel nondistended. Enteric contrast within the colon which is nondistended. Diffuse colonic wall thickening. Vascular/Lymphatic: Atherosclerotic calcifications of the abdominal aorta and the proximal iliac arteries. No lymphadenopathy. Other: Diffuse stranding within the fat of the mesentery with free fluid in the pericolic gutters and trace ascites adjacent to liver. Circumferential body wall edema/anasarca. Musculoskeletal: No acute displaced fracture. Degenerative changes of the spine. IMPRESSION: No CT evidence of acute intra-abdominal pathology. Diffuse body wall and mesenteric anasarca with right greater than left pleural effusions and trace ascites. Recommend correlation with fluid status. Cholelithiasis with partially stented gallbladder. If there is concern for biliary colic, correlation with physical exam and lab values may be useful. Diffuse mild colonic wall thickening, favored to represent changes related to the patient's positive fluid status and/or hypoalbuminemia. Ischemia is not excluded. Aortic Atherosclerosis (ICD10-I70.0). Electronically Signed   By: Corrie Mckusick D.O.   On: 11/10/2017 08:01   US Renal  Result Date: 11/08/2017 CLINICAL DATA:  Acute renal injury EXAM: RENAL / URINARY TRACT ULTRASOUND COMPLETE COMPARISON:  None. FINDINGS: Right Kidney: Length: 10.7 cm. No hydronephrosis is noted. Cortical calcification is noted measuring 3 mm in the midportion of the kidney. Left Kidney: Length: 9.8 cm. Echogenicity within normal limits. No mass or  hydronephrosis  visualized. Bladder: Decompressed by Foley catheter. Changes of ascites and left-sided pleural effusion are noted. IMPRESSION: No acute hydronephrosis is noted. Ascites and left pleural effusion. Electronically Signed   By: Inez Catalina M.D.   On: 11/08/2017 20:15   Dg Chest Port 1 View  Result Date: 11/09/2017 CLINICAL DATA:  Shortness of breath. EXAM: PORTABLE CHEST 1 VIEW COMPARISON:  Radiograph October 31, 2017. FINDINGS: Stable cardiomegaly. Tracheostomy tube is unchanged in position. Left internal jugular catheter is unchanged in position. Nasogastric tube is unchanged in position. No pneumothorax is noted. Stable bibasilar opacities are noted concerning for edema, atelectasis or pneumonia with associated pleural effusions. Bony thorax is unremarkable. IMPRESSION: Stable support apparatus. Stable bilateral lung opacities as described above. Electronically Signed   By: Marijo Conception, M.D.   On: 11/09/2017 13:18    Review Of Systems : Unable to obtain.   There were no vitals taken for this visit. There is no height or weight on file to calculate BMI. General appearance: awake, cooperative, appears stated age and in mild respiratory distress Head: Normocephalic, atraumatic. Eyes: Blue eyes, Pale pink conjunctiva, corneas clear.  Neck: No adenopathy, no carotid bruit, no JVD, supple, symmetrical, trachea midline and thyroid not enlarged. Trach collar applied. Resp: Clearing to auscultation bilaterally. Cardio: Regular rate and rhythm, S1, S2 normal, II/VI systolic murmur, no click, rub or gallop GI: Soft, non-tender; bowel sounds normal; no organomegaly. Extremities: 2 + edema, cyanosis or clubbing. Skin: Warm and dry.  Neurologic: Awake at times. Flaccid right sided paralysis, very soft incomprehensible speech. Very limited left foot and hand movement.   Assessment/Plan Paroxysmal atrial fibrillation with high grade AV block, CHA2DS2VASc score of 7 Chronic Systolic left  heart failure CAD S/P CABG Chronic respiratory failure S/P stroke Type 2 DM Hypertension Hepatic encephalopathy Severe hypoalbuminemia Hypothyroidism CKD, III UTI with possible sepsis Metabolic acidosis- resolved  Agree with holding B-blocker for now. May try one dose IV albumin to facilitate additional diuresis tomorrow. Recheck T4 and TSH Patient needs but she is not a candidate for anticoagulation due to h/o recent GI bleed. Patient is not suitable candidate for ICD due to sepsis. May undergo PEG tube placement.    Birdie Riddle, MD  11/10/2017, 1:27 PM

## 2017-11-10 NOTE — Progress Notes (Signed)
Pulmonary Critical Care Medicine [facility] [place]   PULMONARY CRITICAL CARE SERVICE  PROGRESS NOTE  Date of Service: 11/10/2017  Glenda Walker  ZOX:[id number]  DOB: 1956/01/15   DOA: 10/31/2017  Referring Physician: Carron Curie, MD  HPI: Glenda Walker is a 62 y.o. female seen for follow up of Acute on Chronic Respiratory Failure.  She has been having issues with bradycardia.  Had been on beta-blockers which were discontinued and seen by cardiac  Medications: Reviewed on Rounds  Physical Exam:  Vitals: Temperature 96.7 pulse 81 respiratory rate 10 blood pressure 125/76 saturations 100%  Ventilator Settings currently on T collar FiO2 40%  . General: Comfortable at this time . Eyes: Grossly normal lids, irises & conjunctiva . ENT: grossly tongue is normal . Neck: no obvious mass . Cardiovascular: S1 S2 normal no gallop . Respiratory: Coarse rhonchi are noted bilaterally . Abdomen: soft . Skin: no rash seen on limited exam . Musculoskeletal: not rigid . Psychiatric:unable to assess . Neurologic: no seizure no involuntary movements         Lab Data:   Basic Metabolic Panel: Recent Labs  Lab 11/05/17 0514 11/07/17 0650  11/08/17 0530 11/08/17 1822 11/08/17 2106 11/08/17 2356 11/10/17 0449  NA 134* 139  --  137  --   --  138 141  K 3.9 5.8*   < > 5.4* 4.7 4.6 4.5 4.1  CL 105 103  --  105  --   --  103 106  CO2 22 25  --  21*  --   --  23 26  GLUCOSE 244* 135*  --  162*  --   --  121* 106*  BUN 36* 44*  --  55*  --   --  56* 54*  CREATININE 1.33* 1.72*  --  1.81*  --   --  1.75* 1.79*  CALCIUM 9.5 10.0  --  9.8  --   --  9.6 9.5  MG 1.8 2.3  --   --   --   --   --  2.1  PHOS 3.1 4.2  --   --   --   --   --   --    < > = values in this interval not displayed.    Liver Function Tests: Recent Labs  Lab 11/07/17 0650 11/08/17 0530  AST  --  33  ALT  --  25  ALKPHOS  --  302*  BILITOT  --  1.1  PROT  --  6.3*  ALBUMIN 2.3* 2.2*   No  results for input(s): LIPASE, AMYLASE in the last 168 hours. Recent Labs  Lab 11/08/17 0530  AMMONIA 31    CBC: Recent Labs  Lab 11/05/17 0514 11/07/17 0650 11/08/17 0530 11/10/17 0944  WBC 13.5* 14.8* 12.7* 12.4*  NEUTROABS  --   --   --  10.3*  HGB 9.1* 10.6* 11.2* 10.6*  HCT 29.8* 35.1* 37.2 35.1*  MCV 96.4 96.2 95.9 97.0  PLT 261 276 283 213    Cardiac Enzymes: No results for input(s): CKTOTAL, CKMB, CKMBINDEX, TROPONINI in the last 168 hours.  BNP (last 3 results) No results for input(s): BNP in the last 8760 hours.  ProBNP (last 3 results) No results for input(s): PROBNP in the last 8760 hours.  Radiological Exams: Ct Abdomen Wo Contrast  Result Date: 11/10/2017 CLINICAL DATA:  62 year old female with a history of acute respiratory illness EXAM: CT ABDOMEN WITHOUT CONTRAST TECHNIQUE: Multidetector CT imaging of  the abdomen was performed following the standard protocol without IV contrast. COMPARISON:  No prior abdominal CT FINDINGS: Lower chest: Body wall anasarca/edema. Right greater than left pleural effusion with associated atelectasis/consolidation. Partially imaged cardiomegaly with partially imaged surgical changes of prior median sternotomy. Hepatobiliary: Uniform attenuation of liver parenchyma. Gallbladder somewhat distended with no significant gallbladder wall thickening. Radiopaque gallstone in the neck of the gallbladder. No evidence of intrahepatic or extrahepatic biliary ductal dilatation. Pancreas: Unremarkable pancreas Spleen: Unremarkable spleen Adrenals/Urinary Tract: Unremarkable adrenal glands. Perinephric stranding bilaterally. No evidence of hydronephrosis or nephrolithiasis. Stomach/Bowel: Gastric tube terminates within the stomach lumen which is decompressed. Typical anatomy/relationship of the transverse colon and stomach. Small bowel nondistended. Enteric contrast within the colon which is nondistended. Diffuse colonic wall thickening.  Vascular/Lymphatic: Atherosclerotic calcifications of the abdominal aorta and the proximal iliac arteries. No lymphadenopathy. Other: Diffuse stranding within the fat of the mesentery with free fluid in the pericolic gutters and trace ascites adjacent to liver. Circumferential body wall edema/anasarca. Musculoskeletal: No acute displaced fracture. Degenerative changes of the spine. IMPRESSION: No CT evidence of acute intra-abdominal pathology. Diffuse body wall and mesenteric anasarca with right greater than left pleural effusions and trace ascites. Recommend correlation with fluid status. Cholelithiasis with partially stented gallbladder. If there is concern for biliary colic, correlation with physical exam and lab values may be useful. Diffuse mild colonic wall thickening, favored to represent changes related to the patient's positive fluid status and/or hypoalbuminemia. Ischemia is not excluded. Aortic Atherosclerosis (ICD10-I70.0). Electronically Signed   By: Gilmer MorJaime  Wagner D.O.   On: 11/10/2017 08:01   [place] Renal  Result Date: 11/08/2017 CLINICAL DATA:  Acute renal injury EXAM: RENAL / URINARY TRACT ULTRASOUND COMPLETE COMPARISON:  None. FINDINGS: Right Kidney: Length: 10.7 cm. No hydronephrosis is noted. Cortical calcification is noted measuring 3 mm in the midportion of the kidney. Left Kidney: Length: 9.8 cm. Echogenicity within normal limits. No mass or hydronephrosis visualized. Bladder: Decompressed by Foley catheter. Changes of ascites and left-sided pleural effusion are noted. IMPRESSION: No acute hydronephrosis is noted. Ascites and left pleural effusion. Electronically Signed   By: Alcide CleverMark  Lukens M.D.   On: 11/08/2017 20:15   Dg Chest Port 1 View  Result Date: 11/09/2017 CLINICAL DATA:  Shortness of breath. EXAM: PORTABLE CHEST 1 VIEW COMPARISON:  Radiograph October 31, 2017. FINDINGS: Stable cardiomegaly. Tracheostomy tube is unchanged in position. Left internal jugular catheter is unchanged in  position. Nasogastric tube is unchanged in position. No pneumothorax is noted. Stable bibasilar opacities are noted concerning for edema, atelectasis or pneumonia with associated pleural effusions. Bony thorax is unremarkable. IMPRESSION: Stable support apparatus. Stable bilateral lung opacities as described above. Electronically Signed   By: Lupita RaiderJames  Green Jr, M.D.   On: 11/09/2017 13:18    Assessment/Plan Principal Problem:   Acute on chronic respiratory failure with hypoxia (HCC) Active Problems:   Chronic respiratory failure with hypoxia (HCC)   Acute on chronic systolic CHF (congestive heart failure) (HCC)   Acute metabolic encephalopathy   Tracheostomy status (HCC)   Chronic atrial fibrillation (HCC)   Pleural effusion, right   1. Acute on chronic respiratory failure with hypoxia we will continue supportive care she is tolerating T collar fairly well.  Patient's been on 40% FiO2 need to try to wean the FiO2 down. 2. Chronic respiratory failure with hypoxia we will continue supportive care and follow along 3. Acute metabolic encephalopathy at baseline 4. Chronic atrial fibrillation rate is controlled and actually may now is on the  low side 5. Acute on chronic systolic heart failure patient right now appears to be compensated patient still does have some infiltrates noted on the chest from to optimize the fluid status 6. Bradycardia cardiology is following now perhaps related to metoprolol   I have personally seen and evaluated the patient, evaluated laboratory and imaging results, formulated the assessment and plan and placed orders. The Patient requires high complexity decision making for assessment and support.  Case was discussed on Rounds with the Respiratory Therapy Staff  Yevonne Pax, MD [facility] Pulmonary Critical Care Medicine Sleep Medicine

## 2017-11-11 ENCOUNTER — Other Ambulatory Visit (HOSPITAL_COMMUNITY): Payer: Self-pay

## 2017-11-11 DIAGNOSIS — N179 Acute kidney failure, unspecified: Secondary | ICD-10-CM | POA: Diagnosis not present

## 2017-11-11 DIAGNOSIS — I5023 Acute on chronic systolic (congestive) heart failure: Secondary | ICD-10-CM | POA: Diagnosis not present

## 2017-11-11 DIAGNOSIS — J9621 Acute and chronic respiratory failure with hypoxia: Secondary | ICD-10-CM | POA: Diagnosis not present

## 2017-11-11 DIAGNOSIS — G9341 Metabolic encephalopathy: Secondary | ICD-10-CM | POA: Diagnosis not present

## 2017-11-11 LAB — BLOOD GAS, ARTERIAL
ACID-BASE EXCESS: 2.6 mmol/L — AB (ref 0.0–2.0)
ACID-BASE EXCESS: 5.1 mmol/L — AB (ref 0.0–2.0)
Bicarbonate: 28.8 mmol/L — ABNORMAL HIGH (ref 20.0–28.0)
Bicarbonate: 28.9 mmol/L — ABNORMAL HIGH (ref 20.0–28.0)
FIO2: 60
FIO2: 60
LHR: 12 {breaths}/min
O2 SAT: 77.8 %
O2 Saturation: 99.6 %
PCO2 ART: 41.6 mmHg (ref 32.0–48.0)
PEEP: 5 cmH2O
Patient temperature: 97
Patient temperature: 99.1
VT: 400 mL
pCO2 arterial: 61.4 mmHg — ABNORMAL HIGH (ref 32.0–48.0)
pH, Arterial: 7.287 — ABNORMAL LOW (ref 7.350–7.450)
pH, Arterial: 7.457 — ABNORMAL HIGH (ref 7.350–7.450)
pO2, Arterial: 46.3 mmHg — ABNORMAL LOW (ref 83.0–108.0)
pO2, Arterial: 136 mmHg — ABNORMAL HIGH (ref 83.0–108.0)

## 2017-11-11 LAB — LACTATE DEHYDROGENASE, PLEURAL OR PERITONEAL FLUID: LD FL: 45 U/L — AB (ref 3–23)

## 2017-11-11 LAB — BASIC METABOLIC PANEL
Anion gap: 8 (ref 5–15)
BUN: 54 mg/dL — AB (ref 8–23)
CHLORIDE: 105 mmol/L (ref 98–111)
CO2: 27 mmol/L (ref 22–32)
CREATININE: 1.74 mg/dL — AB (ref 0.44–1.00)
Calcium: 9.5 mg/dL (ref 8.9–10.3)
GFR calc non Af Amer: 30 mL/min — ABNORMAL LOW (ref >60)
GFR, EST AFRICAN AMERICAN: 35 mL/min — AB (ref >60)
Glucose, Bld: 113 mg/dL — ABNORMAL HIGH (ref 70–99)
POTASSIUM: 4.2 mmol/L (ref 3.5–5.1)
SODIUM: 140 mmol/L (ref 135–145)

## 2017-11-11 LAB — GLUCOSE, PLEURAL OR PERITONEAL FLUID: GLUCOSE FL: 112 mg/dL

## 2017-11-11 LAB — TSH: TSH: 3.621 u[IU]/mL (ref 0.350–4.500)

## 2017-11-11 LAB — T4, FREE: Free T4: 0.94 ng/dL (ref 0.82–1.77)

## 2017-11-11 LAB — PROTEIN, PLEURAL OR PERITONEAL FLUID: Total protein, fluid: <3 g/dL

## 2017-11-11 LAB — URIC ACID: URIC ACID, SERUM: 10.1 mg/dL — AB (ref 2.5–7.1)

## 2017-11-11 MED ORDER — CEFAZOLIN SODIUM-DEXTROSE 2-4 GM/100ML-% IV SOLN
INTRAVENOUS | Status: AC
  Filled 2017-11-11: qty 100

## 2017-11-11 MED ORDER — LIDOCAINE HCL (PF) 1 % IJ SOLN
INTRAMUSCULAR | Status: AC
  Filled 2017-11-11: qty 30

## 2017-11-11 MED ORDER — GLUCAGON HCL RDNA (DIAGNOSTIC) 1 MG IJ SOLR
INTRAMUSCULAR | Status: AC
  Filled 2017-11-11: qty 1

## 2017-11-11 MED ORDER — MIDAZOLAM HCL 2 MG/2ML IJ SOLN
INTRAMUSCULAR | Status: AC
  Filled 2017-11-11: qty 4

## 2017-11-11 MED ORDER — FENTANYL CITRATE (PF) 100 MCG/2ML IJ SOLN
INTRAMUSCULAR | Status: AC
  Filled 2017-11-11: qty 4

## 2017-11-11 NOTE — Progress Notes (Signed)
Called to 5E to arrange for transportation of pt to IR at 0930, Mel RN states that is ok. Upon arrival pt was not ready to come down and began having issues with respiratory status so transport to IR for gastric tube was aborted. Advised that we will send for pt later this afternoon if IR schedule allows.

## 2017-11-11 NOTE — Procedures (Signed)
Ultrasound-guided diagnostic and therapeutic right thoracentesis performed yielding 1.3 liters of yellow fluid. No immediate complications. Follow-up chest x-ray pending.The fluid was sent to the lab for preordered studies.        

## 2017-11-11 NOTE — Progress Notes (Addendum)
Pulmonary Critical Care Medicine [facility] [place]   PULMONARY CRITICAL CARE SERVICE  PROGRESS NOTE  Date of Service: 11/11/2017  Glenda Walker  ZOX:[id number]  DOB: 05-17-1955   DOA: 10/31/2017  Referring Physician: Carron Curie, MD  HPI: Glenda Walker is a 62 y.o. female seen for follow up of Acute on Chronic Respiratory Failure.  Patient had decompensation overnight back on the ventilator right now.  Patient also did have a thoracentesis done with removal of 1.3 L which was done earlier today.  Post chest x-ray looks okay  Medications: Reviewed on Rounds  Physical Exam:  Vitals: Temperature 96.9 pulse 87 respiratory rate 34 blood pressure 126/76 saturations 100%  Ventilator Settings mode of ventilation assist control FiO2 60% tidal volume 400 PEEP 5  . General: Comfortable at this time . Eyes: Grossly normal lids, irises & conjunctiva . ENT: grossly tongue is normal . Neck: no obvious mass . Cardiovascular: S1 S2 normal no gallop . Respiratory: Few scattered rhonchi are noted . Abdomen: soft . Skin: no rash seen on limited exam . Musculoskeletal: not rigid . Psychiatric:unable to assess . Neurologic: no seizure no involuntary movements         Lab Data:   Basic Metabolic Panel: Recent Labs  Lab 11/05/17 0514 11/07/17 0650  11/08/17 0530 11/08/17 1822 11/08/17 2106 11/08/17 2356 11/10/17 0449 11/11/17 0531  NA 134* 139  --  137  --   --  138 141 140  K 3.9 5.8*   < > 5.4* 4.7 4.6 4.5 4.1 4.2  CL 105 103  --  105  --   --  103 106 105  CO2 22 25  --  21*  --   --  23 26 27   GLUCOSE 244* 135*  --  162*  --   --  121* 106* 113*  BUN 36* 44*  --  55*  --   --  56* 54* 54*  CREATININE 1.33* 1.72*  --  1.81*  --   --  1.75* 1.79* 1.74*  CALCIUM 9.5 10.0  --  9.8  --   --  9.6 9.5 9.5  MG 1.8 2.3  --   --   --   --   --  2.1  --   PHOS 3.1 4.2  --   --   --   --   --   --   --    < > = values in this interval not displayed.    Liver Function  Tests: Recent Labs  Lab 11/07/17 0650 11/08/17 0530  AST  --  33  ALT  --  25  ALKPHOS  --  302*  BILITOT  --  1.1  PROT  --  6.3*  ALBUMIN 2.3* 2.2*   No results for input(s): LIPASE, AMYLASE in the last 168 hours. Recent Labs  Lab 11/08/17 0530  AMMONIA 31    CBC: Recent Labs  Lab 11/05/17 0514 11/07/17 0650 11/08/17 0530 11/10/17 0944  WBC 13.5* 14.8* 12.7* 12.4*  NEUTROABS  --   --   --  10.3*  HGB 9.1* 10.6* 11.2* 10.6*  HCT 29.8* 35.1* 37.2 35.1*  MCV 96.4 96.2 95.9 97.0  PLT 261 276 283 213    Cardiac Enzymes: No results for input(s): CKTOTAL, CKMB, CKMBINDEX, TROPONINI in the last 168 hours.  BNP (last 3 results) No results for input(s): BNP in the last 8760 hours.  ProBNP (last 3 results) No results for input(s): PROBNP in  the last 8760 hours.  Radiological Exams: Ct Abdomen Wo Contrast  Result Date: 11/10/2017 CLINICAL DATA:  62 year old female with a history of acute respiratory illness EXAM: CT ABDOMEN WITHOUT CONTRAST TECHNIQUE: Multidetector CT imaging of the abdomen was performed following the standard protocol without IV contrast. COMPARISON:  No prior abdominal CT FINDINGS: Lower chest: Body wall anasarca/edema. Right greater than left pleural effusion with associated atelectasis/consolidation. Partially imaged cardiomegaly with partially imaged surgical changes of prior median sternotomy. Hepatobiliary: Uniform attenuation of liver parenchyma. Gallbladder somewhat distended with no significant gallbladder wall thickening. Radiopaque gallstone in the neck of the gallbladder. No evidence of intrahepatic or extrahepatic biliary ductal dilatation. Pancreas: Unremarkable pancreas Spleen: Unremarkable spleen Adrenals/Urinary Tract: Unremarkable adrenal glands. Perinephric stranding bilaterally. No evidence of hydronephrosis or nephrolithiasis. Stomach/Bowel: Gastric tube terminates within the stomach lumen which is decompressed. Typical anatomy/relationship  of the transverse colon and stomach. Small bowel nondistended. Enteric contrast within the colon which is nondistended. Diffuse colonic wall thickening. Vascular/Lymphatic: Atherosclerotic calcifications of the abdominal aorta and the proximal iliac arteries. No lymphadenopathy. Other: Diffuse stranding within the fat of the mesentery with free fluid in the pericolic gutters and trace ascites adjacent to liver. Circumferential body wall edema/anasarca. Musculoskeletal: No acute displaced fracture. Degenerative changes of the spine. IMPRESSION: No CT evidence of acute intra-abdominal pathology. Diffuse body wall and mesenteric anasarca with right greater than left pleural effusions and trace ascites. Recommend correlation with fluid status. Cholelithiasis with partially stented gallbladder. If there is concern for biliary colic, correlation with physical exam and lab values may be useful. Diffuse mild colonic wall thickening, favored to represent changes related to the patient's positive fluid status and/or hypoalbuminemia. Ischemia is not excluded. Aortic Atherosclerosis (ICD10-I70.0). Electronically Signed   By: Gilmer MorJaime  Wagner D.O.   On: 11/10/2017 08:01   Dg Chest Port 1 View  Result Date: 11/11/2017 CLINICAL DATA:  Encounter post RIGHT thoracentesis EXAM: PORTABLE CHEST 1 VIEW COMPARISON:  Portable exam 1154 hours compared to 1023 hours FINDINGS: Tracheostomy tube and nasogastric tube stable. Enlargement of cardiac silhouette post CABG and LEFT atrial appendage clipping. Pulmonary vascular congestion. Decreased RIGHT pleural effusion and basilar atelectasis post thoracentesis. No pneumothorax. Persistent LEFT lower lobe atelectasis versus consolidation. Bones demineralized. IMPRESSION: Decreased RIGHT pleural effusion and basilar atelectasis post thoracentesis. No pneumothorax. Persistent LEFT lower lobe atelectasis versus consolidation. Electronically Signed   By: Ulyses SouthwardMark  Boles M.D.   On: 11/11/2017 12:29    Dg Chest Port 1 View  Result Date: 11/11/2017 CLINICAL DATA:  Respiratory distress EXAM: PORTABLE CHEST 1 VIEW COMPARISON:  Chest x-ray from yesterday and 10/31/2017 FINDINGS: Haziness of the lower chest from pleural fluid and atelectasis by abdominal CT yesterday. There is cardiomegaly, left atrial clipping, and CABG changes. A tracheostomy tube and orogastric tube are in good position. IMPRESSION: Layering pleural effusions and multi segment atelectasis that has progressed since admission. Electronically Signed   By: Marnee SpringJonathon  Watts M.D.   On: 11/11/2017 10:56   [place] Thoracentesis Asp Pleural Space W/img Guide  Result Date: 11/11/2017 INDICATION: Patient with history of coronary artery disease with prior CABG, CHF, respiratory failure, right greater than left pleural effusions; request made for diagnostic and therapeutic right thoracentesis. EXAM: ULTRASOUND GUIDED DIAGNOSTIC AND THERAPEUTIC RIGHT THORACENTESIS MEDICATIONS: None COMPLICATIONS: None immediate. PROCEDURE: An ultrasound guided thoracentesis was thoroughly discussed with the patient's husband and questions answered. The benefits, risks, alternatives and complications were also discussed. The patient's husband understands and wishes for patient to proceed with the procedure. Written consent was  obtained. Ultrasound was performed to localize and mark an adequate pocket of fluid in the right chest. The area was then prepped and draped in the normal sterile fashion. 1% Lidocaine was used for local anesthesia. Under ultrasound guidance a 6 Fr Safe-T-Centesis catheter was introduced. Thoracentesis was performed. The catheter was removed and a dressing applied. FINDINGS: A total of approximately 1.3 liters of yellow fluid was removed. Samples were sent to the laboratory as requested by the clinical team. IMPRESSION: Successful ultrasound guided diagnostic and therapeutic right thoracentesis yielding 1.3 liters of pleural fluid. Read by: Lysle Morales Electronically Signed   By: Judie Petit.  Shick M.D.   On: 11/11/2017 11:41    Assessment/Plan Principal Problem:   Acute on chronic respiratory failure with hypoxia (HCC) Active Problems:   Chronic respiratory failure with hypoxia (HCC)   Acute on chronic systolic CHF (congestive heart failure) (HCC)   Acute metabolic encephalopathy   Tracheostomy status (HCC)   Chronic atrial fibrillation (HCC)   Pleural effusion, right   1. Acute on chronic respiratory failure with hypoxia patient had decompensation placed back on the ventilator now is in assist control with high FiO2 of 60%.  Secretions are fair to moderate.  We will follow-up on the x-ray to try to assess for weaning again once her FiO2 is more optimal. 2. Acute on chronic systolic heart failure last chest x-ray still looks like significant edema present.  Diuresis tolerated. 3. Metabolic encephalopathy grossly unchanged 4. Chronic atrial fibrillation rate is controlled 5. Right-sided pleural effusion status post thoracentesis follow-up on the labs   I have personally seen and evaluated the patient, evaluated laboratory and imaging results, formulated the assessment and plan and placed orders. The Patient requires high complexity decision making for assessment and support.  Case was discussed on Rounds with the Respiratory Therapy Staff time 35 minutes patient is critically ill in danger of cardiac arrest  Yevonne Pax, MD [facility] Pulmonary Critical Care Medicine Sleep Medicine

## 2017-11-12 DIAGNOSIS — I5023 Acute on chronic systolic (congestive) heart failure: Secondary | ICD-10-CM | POA: Diagnosis not present

## 2017-11-12 DIAGNOSIS — J9621 Acute and chronic respiratory failure with hypoxia: Secondary | ICD-10-CM | POA: Diagnosis not present

## 2017-11-12 DIAGNOSIS — G9341 Metabolic encephalopathy: Secondary | ICD-10-CM | POA: Diagnosis not present

## 2017-11-12 DIAGNOSIS — I482 Chronic atrial fibrillation: Secondary | ICD-10-CM | POA: Diagnosis not present

## 2017-11-12 LAB — BASIC METABOLIC PANEL
Anion gap: 11 (ref 5–15)
BUN: 50 mg/dL — AB (ref 8–23)
CO2: 28 mmol/L (ref 22–32)
Calcium: 9.5 mg/dL (ref 8.9–10.3)
Chloride: 104 mmol/L (ref 98–111)
Creatinine, Ser: 1.72 mg/dL — ABNORMAL HIGH (ref 0.44–1.00)
GFR calc Af Amer: 36 mL/min — ABNORMAL LOW (ref >60)
GFR calc non Af Amer: 31 mL/min — ABNORMAL LOW (ref >60)
GLUCOSE: 100 mg/dL — AB (ref 70–99)
POTASSIUM: 4.7 mmol/L (ref 3.5–5.1)
SODIUM: 143 mmol/L (ref 135–145)

## 2017-11-12 NOTE — Progress Notes (Signed)
Pulmonary Critical Care Medicine [facility] [place]   PULMONARY CRITICAL CARE SERVICE  PROGRESS NOTE  Date of Service: 11/12/2017  Glenda Walker  NFA:[id number]  DOB: 1955/11/13   DOA: 10/31/2017  Referring Physician: Carron Curie, MD  HPI: MARIADELALUZ Walker is a 62 y.o. female seen for follow up of Acute on Chronic Respiratory Failure.  Currently is on assist control mode patient is in full support.  We are trying to wean the oxygen down her oxygen was decreased from 60% to 40% so appears to be doing a little bit better hopefully we can start more aggressive weaning today.  Medications: Reviewed on Rounds  Physical Exam:  Vitals: Temperature 97.6 pulse 87 respiratory rate 26 blood pressure 104/72 saturations 98%  Ventilator Settings mode of ventilation assist control FiO2 40% tidal volume 450 PEEP 5  . General: Comfortable at this time . Eyes: Grossly normal lids, irises & conjunctiva . ENT: grossly tongue is normal . Neck: no obvious mass . Cardiovascular: S1 S2 normal no gallop . Respiratory: Scattered rhonchi are noted bilaterally. . Abdomen: soft . Skin: no rash seen on limited exam . Musculoskeletal: not rigid . Psychiatric:unable to assess . Neurologic: no seizure no involuntary movements         Lab Data:   Basic Metabolic Panel: Recent Labs  Lab 11/07/17 0650  11/08/17 0530  11/08/17 2106 11/08/17 2356 11/10/17 0449 11/11/17 0531 11/12/17 0657  NA 139  --  137  --   --  138 141 140 143  K 5.8*   < > 5.4*   < > 4.6 4.5 4.1 4.2 4.7  CL 103  --  105  --   --  103 106 105 104  CO2 25  --  21*  --   --  23 26 27 28   GLUCOSE 135*  --  162*  --   --  121* 106* 113* 100*  BUN 44*  --  55*  --   --  56* 54* 54* 50*  CREATININE 1.72*  --  1.81*  --   --  1.75* 1.79* 1.74* 1.72*  CALCIUM 10.0  --  9.8  --   --  9.6 9.5 9.5 9.5  MG 2.3  --   --   --   --   --  2.1  --   --   PHOS 4.2  --   --   --   --   --   --   --   --    < > = values in this  interval not displayed.    ABG: Recent Labs  Lab 11/07/17 0508 11/11/17 1005 11/11/17 1750  PHART 7.362 7.287* 7.457*  PCO2ART 40.2 61.4* 41.6  PO2ART 170* 46.3* 136*  HCO3 22.2 28.8* 28.9*  O2SAT 99.5 77.8 99.6    Liver Function Tests: Recent Labs  Lab 11/07/17 0650 11/08/17 0530  AST  --  33  ALT  --  25  ALKPHOS  --  302*  BILITOT  --  1.1  PROT  --  6.3*  ALBUMIN 2.3* 2.2*   No results for input(s): LIPASE, AMYLASE in the last 168 hours. Recent Labs  Lab 11/08/17 0530  AMMONIA 31    CBC: Recent Labs  Lab 11/07/17 0650 11/08/17 0530 11/10/17 0944  WBC 14.8* 12.7* 12.4*  NEUTROABS  --   --  10.3*  HGB 10.6* 11.2* 10.6*  HCT 35.1* 37.2 35.1*  MCV 96.2 95.9 97.0  PLT 276  283 213    Cardiac Enzymes: No results for input(s): CKTOTAL, CKMB, CKMBINDEX, TROPONINI in the last 168 hours.  BNP (last 3 results) No results for input(s): BNP in the last 8760 hours.  ProBNP (last 3 results) No results for input(s): PROBNP in the last 8760 hours.  Radiological Exams: Dg Chest Port 1 View  Result Date: 11/11/2017 CLINICAL DATA:  Encounter post RIGHT thoracentesis EXAM: PORTABLE CHEST 1 VIEW COMPARISON:  Portable exam 1154 hours compared to 1023 hours FINDINGS: Tracheostomy tube and nasogastric tube stable. Enlargement of cardiac silhouette post CABG and LEFT atrial appendage clipping. Pulmonary vascular congestion. Decreased RIGHT pleural effusion and basilar atelectasis post thoracentesis. No pneumothorax. Persistent LEFT lower lobe atelectasis versus consolidation. Bones demineralized. IMPRESSION: Decreased RIGHT pleural effusion and basilar atelectasis post thoracentesis. No pneumothorax. Persistent LEFT lower lobe atelectasis versus consolidation. Electronically Signed   By: Ulyses Southward M.D.   On: 11/11/2017 12:29   Dg Chest Port 1 View  Result Date: 11/11/2017 CLINICAL DATA:  Respiratory distress EXAM: PORTABLE CHEST 1 VIEW COMPARISON:  Chest x-ray from  yesterday and 10/31/2017 FINDINGS: Haziness of the lower chest from pleural fluid and atelectasis by abdominal CT yesterday. There is cardiomegaly, left atrial clipping, and CABG changes. A tracheostomy tube and orogastric tube are in good position. IMPRESSION: Layering pleural effusions and multi segment atelectasis that has progressed since admission. Electronically Signed   By: Marnee Spring M.D.   On: 11/11/2017 10:56   US Thoracentesis Asp Pleural Space W/img Guide  Result Date: 11/11/2017 INDICATION: Patient with history of coronary artery disease with prior CABG, CHF, respiratory failure, right greater than left pleural effusions; request made for diagnostic and therapeutic right thoracentesis. EXAM: ULTRASOUND GUIDED DIAGNOSTIC AND THERAPEUTIC RIGHT THORACENTESIS MEDICATIONS: None COMPLICATIONS: None immediate. PROCEDURE: An ultrasound guided thoracentesis was thoroughly discussed with the patient's husband and questions answered. The benefits, risks, alternatives and complications were also discussed. The patient's husband understands and wishes for patient to proceed with the procedure. Written consent was obtained. Ultrasound was performed to localize and mark an adequate pocket of fluid in the right chest. The area was then prepped and draped in the normal sterile fashion. 1% Lidocaine was used for local anesthesia. Under ultrasound guidance a 6 Fr Safe-T-Centesis catheter was introduced. Thoracentesis was performed. The catheter was removed and a dressing applied. FINDINGS: A total of approximately 1.3 liters of yellow fluid was removed. Samples were sent to the laboratory as requested by the clinical team. IMPRESSION: Successful ultrasound guided diagnostic and therapeutic right thoracentesis yielding 1.3 liters of pleural fluid. Read by: Lysle Morales Electronically Signed   By: Judie Petit.  Shick M.D.   On: 11/11/2017 11:41    Assessment/Plan Principal Problem:   Acute on chronic respiratory  failure with hypoxia (HCC) Active Problems:   Chronic respiratory failure with hypoxia (HCC)   Acute on chronic systolic CHF (congestive heart failure) (HCC)   Acute metabolic encephalopathy   Tracheostomy status (HCC)   Chronic atrial fibrillation (HCC)   Pleural effusion, right   1. Acute on chronic respiratory failure with hypoxia with blood gases noted last ABG actually looks a little bit better.  We will continue to decrease the FiO2 down.  We will drop her to 30% at this point.  We will continue pulmonary toilet secretion management. 2. Acute systolic heart failure continue with fluid management.  Diuresis tolerated.  Patient also had thoracentesis done yesterday. 3. Acute metabolic encephalopathy grossly unchanged continue with present management 4. Chronic atrial fibrillation rate is  controlled 5. Right-sided pleural effusion status post thoracentesis 6. Tracheostomy working towards weaning again   I have personally seen and evaluated the patient, evaluated laboratory and imaging results, formulated the assessment and plan and placed orders. The Patient requires high complexity decision making for assessment and support.  Case was discussed on Rounds with the Respiratory Therapy Staff  Yevonne PaxSaadat A Ledell Codrington, MD [facility] Pulmonary Critical Care Medicine Sleep Medicine

## 2017-11-13 DIAGNOSIS — G9341 Metabolic encephalopathy: Secondary | ICD-10-CM | POA: Diagnosis not present

## 2017-11-13 DIAGNOSIS — I5023 Acute on chronic systolic (congestive) heart failure: Secondary | ICD-10-CM | POA: Diagnosis not present

## 2017-11-13 DIAGNOSIS — I482 Chronic atrial fibrillation: Secondary | ICD-10-CM | POA: Diagnosis not present

## 2017-11-13 DIAGNOSIS — J9621 Acute and chronic respiratory failure with hypoxia: Secondary | ICD-10-CM | POA: Diagnosis not present

## 2017-11-13 LAB — CULTURE, BLOOD (ROUTINE X 2)
Culture: NO GROWTH
Culture: NO GROWTH

## 2017-11-13 LAB — PH, BODY FLUID: PH, BODY FLUID: 7.4

## 2017-11-13 NOTE — Progress Notes (Signed)
Pulmonary Critical Care Medicine [facility] [place]   PULMONARY CRITICAL CARE SERVICE  PROGRESS NOTE  Date of Service: 11/13/2017  Glenda Walker  BPZ:[id number]  DOB: 11-Feb-1956   DOA: 10/31/2017  Referring Physician: Carron Curie, MD  HPI: Glenda Walker is a 62 y.o. female seen for follow up of Acute on Chronic Respiratory Failure.  Patient is on pressure support weaning doing fairly well the goal was for about 8 hours  Medications: Reviewed on Rounds  Physical Exam:  Vitals: Temperature 96.2 pulse 73 respiratory rate 22 blood pressure 129/56 saturations 100%  Ventilator Settings mode of ventilation pressure support FiO2 20% tidal volume 502 pressure support 12 PEEP 5  . General: Comfortable at this time . Eyes: Grossly normal lids, irises & conjunctiva . ENT: grossly tongue is normal . Neck: no obvious mass . Cardiovascular: S1 S2 normal no gallop . Respiratory: No rhonchi no rales are noted . Abdomen: soft . Skin: no rash seen on limited exam . Musculoskeletal: not rigid . Psychiatric:unable to assess . Neurologic: no seizure no involuntary movements         Lab Data:   Basic Metabolic Panel: Recent Labs  Lab 11/07/17 0650  11/08/17 0530  11/08/17 2106 11/08/17 2356 11/10/17 0449 11/11/17 0531 11/12/17 0657  NA 139  --  137  --   --  138 141 140 143  K 5.8*   < > 5.4*   < > 4.6 4.5 4.1 4.2 4.7  CL 103  --  105  --   --  103 106 105 104  CO2 25  --  21*  --   --  23 26 27 28   GLUCOSE 135*  --  162*  --   --  121* 106* 113* 100*  BUN 44*  --  55*  --   --  56* 54* 54* 50*  CREATININE 1.72*  --  1.81*  --   --  1.75* 1.79* 1.74* 1.72*  CALCIUM 10.0  --  9.8  --   --  9.6 9.5 9.5 9.5  MG 2.3  --   --   --   --   --  2.1  --   --   PHOS 4.2  --   --   --   --   --   --   --   --    < > = values in this interval not displayed.    ABG: Recent Labs  Lab 11/07/17 0508 11/11/17 1005 11/11/17 1750  PHART 7.362 7.287* 7.457*  PCO2ART 40.2  61.4* 41.6  PO2ART 170* 46.3* 136*  HCO3 22.2 28.8* 28.9*  O2SAT 99.5 77.8 99.6    Liver Function Tests: Recent Labs  Lab 11/07/17 0650 11/08/17 0530  AST  --  33  ALT  --  25  ALKPHOS  --  302*  BILITOT  --  1.1  PROT  --  6.3*  ALBUMIN 2.3* 2.2*   No results for input(s): LIPASE, AMYLASE in the last 168 hours. Recent Labs  Lab 11/08/17 0530  AMMONIA 31    CBC: Recent Labs  Lab 11/07/17 0650 11/08/17 0530 11/10/17 0944  WBC 14.8* 12.7* 12.4*  NEUTROABS  --   --  10.3*  HGB 10.6* 11.2* 10.6*  HCT 35.1* 37.2 35.1*  MCV 96.2 95.9 97.0  PLT 276 283 213    Cardiac Enzymes: No results for input(s): CKTOTAL, CKMB, CKMBINDEX, TROPONINI in the last 168 hours.  BNP (last 3 results)  No results for input(s): BNP in the last 8760 hours.  ProBNP (last 3 results) No results for input(s): PROBNP in the last 8760 hours.  Radiological Exams: No results found.  Assessment/Plan Principal Problem:   Acute on chronic respiratory failure with hypoxia (HCC) Active Problems:   Chronic respiratory failure with hypoxia (HCC)   Acute on chronic systolic CHF (congestive heart failure) (HCC)   Acute metabolic encephalopathy   Tracheostomy status (HCC)   Chronic atrial fibrillation (HCC)   Pleural effusion, right   1. Acute on chronic respiratory failure with hypoxia continue to wean on pressure support titrate oxygen as tolerated continue aggressive pulmonary toilet goal for the wean is 8 hours 2. Acute on chronic systolic heart failure continue to monitor fluid status diuresis as necessary 3. Metabolic encephalopathy grossly unchanged continue present management 4. Chronic atrial fibrillation rate is controlled 5. Pleural effusion follow-up x-ray as necessary 6. Tracheostomy status working towards eventual decannulation   I have personally seen and evaluated the patient, evaluated laboratory and imaging results, formulated the assessment and plan and placed orders. The  Patient requires high complexity decision making for assessment and support.  Case was discussed on Rounds with the Respiratory Therapy Staff  Yevonne Pax, MD [facility] Pulmonary Critical Care Medicine Sleep Medicine

## 2017-11-14 DIAGNOSIS — I5023 Acute on chronic systolic (congestive) heart failure: Secondary | ICD-10-CM | POA: Diagnosis not present

## 2017-11-14 DIAGNOSIS — J9621 Acute and chronic respiratory failure with hypoxia: Secondary | ICD-10-CM | POA: Diagnosis not present

## 2017-11-14 DIAGNOSIS — G9341 Metabolic encephalopathy: Secondary | ICD-10-CM | POA: Diagnosis not present

## 2017-11-14 DIAGNOSIS — I482 Chronic atrial fibrillation: Secondary | ICD-10-CM | POA: Diagnosis not present

## 2017-11-14 LAB — CBC
HCT: 36.5 % (ref 36.0–46.0)
Hemoglobin: 10.9 g/dL — ABNORMAL LOW (ref 12.0–15.0)
MCH: 28.9 pg (ref 26.0–34.0)
MCHC: 29.9 g/dL — ABNORMAL LOW (ref 30.0–36.0)
MCV: 96.8 fL (ref 78.0–100.0)
PLATELETS: 171 10*3/uL (ref 150–400)
RBC: 3.77 MIL/uL — ABNORMAL LOW (ref 3.87–5.11)
RDW: 22.7 % — AB (ref 11.5–15.5)
WBC: 8.6 10*3/uL (ref 4.0–10.5)

## 2017-11-14 LAB — BASIC METABOLIC PANEL
ANION GAP: 7 (ref 5–15)
BUN: 40 mg/dL — AB (ref 8–23)
CALCIUM: 9.1 mg/dL (ref 8.9–10.3)
CO2: 29 mmol/L (ref 22–32)
CREATININE: 1.21 mg/dL — AB (ref 0.44–1.00)
Chloride: 106 mmol/L (ref 98–111)
GFR calc Af Amer: 55 mL/min — ABNORMAL LOW (ref >60)
GFR, EST NON AFRICAN AMERICAN: 47 mL/min — AB (ref >60)
Glucose, Bld: 108 mg/dL — ABNORMAL HIGH (ref 70–99)
Potassium: 4 mmol/L (ref 3.5–5.1)
Sodium: 142 mmol/L (ref 135–145)

## 2017-11-14 NOTE — Progress Notes (Signed)
Pulmonary Critical Care Medicine [facility] [place]   PULMONARY CRITICAL CARE SERVICE  PROGRESS NOTE  Date of Service: 11/14/2017  Glenda Walker  ZOX:[id number]  DOB: February 06, 1956   DOA: 10/31/2017  Referring Physician: Carron Curie, MD  HPI: Glenda Walker is a 62 y.o. female seen for follow up of Acute on Chronic Respiratory Failure.  Patient is on pressure support due to 8 hours to goal today is for 12 hours  Medications: Reviewed on Rounds  Physical Exam:  Vitals: Temperature 96.5 pulse 98 respiratory 24 blood pressure 124/83 saturations 100%  Ventilator Settings mode of ventilation pressure support FiO2 35% tidal volume 547 pressure support 12 PEEP 5  . General: Comfortable at this time . Eyes: Grossly normal lids, irises & conjunctiva . ENT: grossly tongue is normal . Neck: no obvious mass . Cardiovascular: S1 S2 normal no gallop . Respiratory: Few rhonchi are noted . Abdomen: soft . Skin: no rash seen on limited exam . Musculoskeletal: not rigid . Psychiatric:unable to assess . Neurologic: no seizure no involuntary movements         Lab Data:   Basic Metabolic Panel: Recent Labs  Lab 11/08/17 2356 11/10/17 0449 11/11/17 0531 11/12/17 0657 11/14/17 0511  NA 138 141 140 143 142  K 4.5 4.1 4.2 4.7 4.0  CL 103 106 105 104 106  CO2 23 26 27 28 29   GLUCOSE 121* 106* 113* 100* 108*  BUN 56* 54* 54* 50* 40*  CREATININE 1.75* 1.79* 1.74* 1.72* 1.21*  CALCIUM 9.6 9.5 9.5 9.5 9.1  MG  --  2.1  --   --   --     ABG: Recent Labs  Lab 11/11/17 1005 11/11/17 1750  PHART 7.287* 7.457*  PCO2ART 61.4* 41.6  PO2ART 46.3* 136*  HCO3 28.8* 28.9*  O2SAT 77.8 99.6    Liver Function Tests: Recent Labs  Lab 11/08/17 0530  AST 33  ALT 25  ALKPHOS 302*  BILITOT 1.1  PROT 6.3*  ALBUMIN 2.2*   No results for input(s): LIPASE, AMYLASE in the last 168 hours. Recent Labs  Lab 11/08/17 0530  AMMONIA 31    CBC: Recent Labs  Lab 11/08/17 0530  11/10/17 0944 11/14/17 0511  WBC 12.7* 12.4* 8.6  NEUTROABS  --  10.3*  --   HGB 11.2* 10.6* 10.9*  HCT 37.2 35.1* 36.5  MCV 95.9 97.0 96.8  PLT 283 213 171    Cardiac Enzymes: No results for input(s): CKTOTAL, CKMB, CKMBINDEX, TROPONINI in the last 168 hours.  BNP (last 3 results) No results for input(s): BNP in the last 8760 hours.  ProBNP (last 3 results) No results for input(s): PROBNP in the last 8760 hours.  Radiological Exams: No results found.  Assessment/Plan Principal Problem:   Acute on chronic respiratory failure with hypoxia (HCC) Active Problems:   Chronic respiratory failure with hypoxia (HCC)   Acute on chronic systolic CHF (congestive heart failure) (HCC)   Acute metabolic encephalopathy   Tracheostomy status (HCC)   Chronic atrial fibrillation (HCC)   Pleural effusion, right   1. Acute on chronic respiratory failure with hypoxia continue to penicillin support as mentioned 12 hours as scheduled for today.  Continue pulmonary toilet supportive care. 2. Metabolic encephalopathy she is doing a little bit better we will continue present management 3. Acute on chronic systolic heart failure appears to be more compensated follow-up x-ray as necessary. 4. Tracheostomy working towards weaning 5. Chronic atrial fibrillation rate is controlled 6. Pleural effusion status  post thoracentesis   I have personally seen and evaluated the patient, evaluated laboratory and imaging results, formulated the assessment and plan and placed orders. The Patient requires high complexity decision making for assessment and support.  Case was discussed on Rounds with the Respiratory Therapy Staff  Yevonne Pax, MD [facility] Pulmonary Critical Care Medicine Sleep Medicine

## 2017-11-15 ENCOUNTER — Other Ambulatory Visit (HOSPITAL_COMMUNITY): Payer: Self-pay

## 2017-11-15 DIAGNOSIS — J9621 Acute and chronic respiratory failure with hypoxia: Secondary | ICD-10-CM | POA: Diagnosis not present

## 2017-11-15 DIAGNOSIS — I482 Chronic atrial fibrillation: Secondary | ICD-10-CM | POA: Diagnosis not present

## 2017-11-15 DIAGNOSIS — G9341 Metabolic encephalopathy: Secondary | ICD-10-CM | POA: Diagnosis not present

## 2017-11-15 DIAGNOSIS — I5023 Acute on chronic systolic (congestive) heart failure: Secondary | ICD-10-CM | POA: Diagnosis not present

## 2017-11-15 MED ORDER — SODIUM CHLORIDE 0.9 % IV SOLN
INTRAVENOUS | Status: AC | PRN
  Administered 2017-11-15: 10 mL/h via INTRAVENOUS

## 2017-11-15 MED ORDER — CEFAZOLIN SODIUM-DEXTROSE 2-4 GM/100ML-% IV SOLN: 2.0000 g | Freq: Three times a day (TID) | INTRAVENOUS | Status: DC

## 2017-11-15 MED ORDER — CEFAZOLIN SODIUM-DEXTROSE 1-4 GM/50ML-% IV SOLN
INTRAVENOUS | Status: AC | PRN
  Administered 2017-11-15: 2 g via INTRAVENOUS

## 2017-11-15 MED ORDER — CEFAZOLIN SODIUM-DEXTROSE 2-4 GM/100ML-% IV SOLN
INTRAVENOUS | Status: AC
  Filled 2017-11-15: qty 100

## 2017-11-15 NOTE — Progress Notes (Signed)
Patient seen in IR today for percutaneous gastrostomy tube placement which was originally approved on 8/29 by Dr. Grace Isaac, unfortunately patient was too unstable to undergo procedure on 8/30 due to respiratory distress. She was stabilized and procedure was planned for today.  She returned to IR today however upon exam by Dr. Lowella Dandy patient was found to have increased ascites from original CT abdomen on 8/29. Given high risk of infection with g-tube placement with concurrent ascites, procedure has been cancelled. This was discussed with Merril Abbe, NP by myself today.   IR to sign off at this time. Please call with any questions or concerns.

## 2017-11-15 NOTE — Progress Notes (Signed)
Pulmonary Critical Care Medicine [facility] [place]   PULMONARY CRITICAL CARE SERVICE  PROGRESS NOTE  Date of Service: 11/15/2017  Glenda Walker  LAG:[id number]  DOB: December 26, 1955   DOA: 10/31/2017  Referring Physician: Carron Curie, MD  HPI: Glenda Walker is a 62 y.o. female seen for follow up of Acute on Chronic Respiratory Failure.  She is weaning again on pressure support seems to be tolerating it well.  The goal is to do 16 hours.  Medications: Reviewed on Rounds  Physical Exam:  Vitals: Temperature 97.1 pulse 86 respiratory rate 21 blood pressure 134/90 saturations 95%  Ventilator Settings mode of ventilation is pressure support FiO2 28% tidal volume 400 pressure support 12 PEEP 5  . General: Comfortable at this time . Eyes: Grossly normal lids, irises & conjunctiva . ENT: grossly tongue is normal . Neck: no obvious mass . Cardiovascular: S1 S2 normal no gallop . Respiratory: Coarse breath sounds few rhonchi . Abdomen: soft . Skin: no rash seen on limited exam . Musculoskeletal: not rigid . Psychiatric:unable to assess . Neurologic: no seizure no involuntary movements         Lab Data:   Basic Metabolic Panel: Recent Labs  Lab 11/08/17 2356 11/10/17 0449 11/11/17 0531 11/12/17 0657 11/14/17 0511  NA 138 141 140 143 142  K 4.5 4.1 4.2 4.7 4.0  CL 103 106 105 104 106  CO2 23 26 27 28 29   GLUCOSE 121* 106* 113* 100* 108*  BUN 56* 54* 54* 50* 40*  CREATININE 1.75* 1.79* 1.74* 1.72* 1.21*  CALCIUM 9.6 9.5 9.5 9.5 9.1  MG  --  2.1  --   --   --     ABG: Recent Labs  Lab 11/11/17 1005 11/11/17 1750  PHART 7.287* 7.457*  PCO2ART 61.4* 41.6  PO2ART 46.3* 136*  HCO3 28.8* 28.9*  O2SAT 77.8 99.6    Liver Function Tests: No results for input(s): AST, ALT, ALKPHOS, BILITOT, PROT, ALBUMIN in the last 168 hours. No results for input(s): LIPASE, AMYLASE in the last 168 hours. No results for input(s): AMMONIA in the last 168  hours.  CBC: Recent Labs  Lab 11/10/17 0944 11/14/17 0511  WBC 12.4* 8.6  NEUTROABS 10.3*  --   HGB 10.6* 10.9*  HCT 35.1* 36.5  MCV 97.0 96.8  PLT 213 171    Cardiac Enzymes: No results for input(s): CKTOTAL, CKMB, CKMBINDEX, TROPONINI in the last 168 hours.  BNP (last 3 results) No results for input(s): BNP in the last 8760 hours.  ProBNP (last 3 results) No results for input(s): PROBNP in the last 8760 hours.  Radiological Exams: No results found.  Assessment/Plan Principal Problem:   Acute on chronic respiratory failure with hypoxia (HCC) Active Problems:   Chronic respiratory failure with hypoxia (HCC)   Acute on chronic systolic CHF (congestive heart failure) (HCC)   Acute metabolic encephalopathy   Tracheostomy status (HCC)   Chronic atrial fibrillation (HCC)   Pleural effusion, right   1. Acute on chronic respiratory failure with hypoxia we will continue with weaning on pressure support as mentioned the goal is 16 hours looks good so far advance as tolerated. 2. Acute on chronic systolic heart failure right now appears to be compensated she is actually doing better continue to monitor fluid status. 3. Acute metabolic encephalopathy more clear today we will continue supportive care 4. Chronic atrial fibrillation rate is controlled at this time. 5. Pleural effusion status post thoracentesis 6. Tracheostomy working towards eventual decannulation  as noted previously   I have personally seen and evaluated the patient, evaluated laboratory and imaging results, formulated the assessment and plan and placed orders. The Patient requires high complexity decision making for assessment and support.  Case was discussed on Rounds with the Respiratory Therapy Staff  Yevonne Pax, MD [facility] Pulmonary Critical Care Medicine Sleep Medicine

## 2017-11-16 ENCOUNTER — Other Ambulatory Visit (HOSPITAL_COMMUNITY): Payer: Self-pay

## 2017-11-16 ENCOUNTER — Ambulatory Visit (HOSPITAL_COMMUNITY): Payer: Self-pay | Attending: Nurse Practitioner

## 2017-11-16 DIAGNOSIS — G9341 Metabolic encephalopathy: Secondary | ICD-10-CM | POA: Diagnosis not present

## 2017-11-16 DIAGNOSIS — J90 Pleural effusion, not elsewhere classified: Secondary | ICD-10-CM | POA: Insufficient documentation

## 2017-11-16 DIAGNOSIS — J9621 Acute and chronic respiratory failure with hypoxia: Secondary | ICD-10-CM | POA: Diagnosis not present

## 2017-11-16 DIAGNOSIS — I5023 Acute on chronic systolic (congestive) heart failure: Secondary | ICD-10-CM | POA: Diagnosis not present

## 2017-11-16 DIAGNOSIS — I482 Chronic atrial fibrillation: Secondary | ICD-10-CM | POA: Diagnosis not present

## 2017-11-16 DIAGNOSIS — R209 Unspecified disturbances of skin sensation: Secondary | ICD-10-CM

## 2017-11-16 LAB — RENAL FUNCTION PANEL
ALBUMIN: 2.2 g/dL — AB (ref 3.5–5.0)
ANION GAP: 9 (ref 5–15)
BUN: 32 mg/dL — ABNORMAL HIGH (ref 8–23)
CALCIUM: 9.3 mg/dL (ref 8.9–10.3)
CO2: 27 mmol/L (ref 22–32)
Chloride: 107 mmol/L (ref 98–111)
Creatinine, Ser: 0.99 mg/dL (ref 0.44–1.00)
GFR calc Af Amer: >60 mL/min (ref >60)
Glucose, Bld: 98 mg/dL (ref 70–99)
PHOSPHORUS: 2.5 mg/dL (ref 2.5–4.6)
POTASSIUM: 4.2 mmol/L (ref 3.5–5.1)
Sodium: 143 mmol/L (ref 135–145)

## 2017-11-16 LAB — CBC
HEMATOCRIT: 34.8 % — AB (ref 36.0–46.0)
Hemoglobin: 10.1 g/dL — ABNORMAL LOW (ref 12.0–15.0)
MCH: 28.5 pg (ref 26.0–34.0)
MCHC: 29 g/dL — ABNORMAL LOW (ref 30.0–36.0)
MCV: 98.3 fL (ref 78.0–100.0)
Platelets: 173 10*3/uL (ref 150–400)
RBC: 3.54 MIL/uL — AB (ref 3.87–5.11)
RDW: 22.5 % — ABNORMAL HIGH (ref 11.5–15.5)
WBC: 7.4 10*3/uL (ref 4.0–10.5)

## 2017-11-16 LAB — MAGNESIUM: Magnesium: 2 mg/dL (ref 1.7–2.4)

## 2017-11-16 NOTE — Progress Notes (Signed)
Pulmonary Critical Care Medicine [facility] [place]   PULMONARY CRITICAL CARE SERVICE  PROGRESS NOTE  Date of Service: 11/16/2017  Glenda Walker  ZOX:[id number]  DOB: 1955-05-30   DOA: 10/31/2017  Referring Physician: Carron Curie, MD  HPI: Glenda Walker is a 62 y.o. female seen for follow up of Acute on Chronic Respiratory Failure.  Patient is on pressure support at this time she was attempted to wean but failed.  Chest x-ray shows no pleural effusion to be coming back.  She also did have ascites noted.  Medications: Reviewed on Rounds  Physical Exam:  Vitals: Temperature is 97.0 pulse 98 respiratory rate 20 blood pressure 150/83 saturations 98%  Ventilator Settings mode of ventilation pressure support FiO2 35% pressure support 12 PEEP 5  . General: Comfortable at this time . Eyes: Grossly normal lids, irises & conjunctiva . ENT: grossly tongue is normal . Neck: no obvious mass . Cardiovascular: S1 S2 normal no gallop . Respiratory: Coarse breath sounds are noted diminished on the right . Abdomen: soft . Skin: no rash seen on limited exam . Musculoskeletal: not rigid . Psychiatric:unable to assess . Neurologic: no seizure no involuntary movements         Lab Data:   Basic Metabolic Panel: Recent Labs  Lab 11/10/17 0449 11/11/17 0531 11/12/17 0657 11/14/17 0511 11/16/17 1123  NA 141 140 143 142 143  K 4.1 4.2 4.7 4.0 4.2  CL 106 105 104 106 107  CO2 26 27 28 29 27   GLUCOSE 106* 113* 100* 108* 98  BUN 54* 54* 50* 40* 32*  CREATININE 1.79* 1.74* 1.72* 1.21* 0.99  CALCIUM 9.5 9.5 9.5 9.1 9.3  MG 2.1  --   --   --  2.0  PHOS  --   --   --   --  2.5    ABG: Recent Labs  Lab 11/11/17 1005 11/11/17 1750  PHART 7.287* 7.457*  PCO2ART 61.4* 41.6  PO2ART 46.3* 136*  HCO3 28.8* 28.9*  O2SAT 77.8 99.6    Liver Function Tests: Recent Labs  Lab 11/16/17 1123  ALBUMIN 2.2*   No results for input(s): LIPASE, AMYLASE in the last 168 hours. No  results for input(s): AMMONIA in the last 168 hours.  CBC: Recent Labs  Lab 11/10/17 0944 11/14/17 0511 11/16/17 1123  WBC 12.4* 8.6 7.4  NEUTROABS 10.3*  --   --   HGB 10.6* 10.9* 10.1*  HCT 35.1* 36.5 34.8*  MCV 97.0 96.8 98.3  PLT 213 171 173    Cardiac Enzymes: No results for input(s): CKTOTAL, CKMB, CKMBINDEX, TROPONINI in the last 168 hours.  BNP (last 3 results) No results for input(s): BNP in the last 8760 hours.  ProBNP (last 3 results) No results for input(s): PROBNP in the last 8760 hours.  Radiological Exams: Dg Chest Port 1 View  Result Date: 11/16/2017 CLINICAL DATA:  Tracheostomy.  Pleural effusion. EXAM: PORTABLE CHEST 1 VIEW COMPARISON:  11/11/2017 FINDINGS: Patient rotated minimally right. Tracheostomy is appropriately positioned. Nasogastric tube extends beyond the inferior aspect of the film. Prior median sternotomy. Cardiomegaly accentuated by AP portable technique. Small right pleural effusion is new or increased. No pneumothorax. Interstitial edema is mild and similar. Persistent left and worsened right base airspace disease. IMPRESSION: Slightly worsened right-sided aeration with small pleural effusion and developing atelectasis. Left base consolidation is not significantly changed. Cardiomegaly with mild congestive heart failure. Electronically Signed   By: Jeronimo Greaves M.D.   On: 11/16/2017 10:36  Ir Abdomen US Limited  Result Date: 11/15/2017 CLINICAL DATA:  62 year old with respiratory failure and encephalopathy. Patient was scheduled for a percutaneous gastrostomy tube placement. EXAM: LIMITED ABDOMEN ULTRASOUND FOR ASCITES TECHNIQUE: Limited ultrasound survey for ascites was performed in all four abdominal quadrants. COMPARISON:  CT 11/10/2017 FINDINGS: Previous CT demonstrated a small amount of abdominal ascites. The abdomen was evaluated with ultrasound prior to the procedure. Moderate amount of ascites is noted around the right hepatic lobe. IMPRESSION:  Moderate amount of ascites in the right upper quadrant. Percutaneous gastrostomy tube was canceled due to risk of peritonitis with the ascites. Electronically Signed   By: Richarda Overlie M.D.   On: 11/15/2017 16:15    Assessment/Plan Principal Problem:   Acute on chronic respiratory failure with hypoxia (HCC) Active Problems:   Chronic respiratory failure with hypoxia (HCC)   Acute on chronic systolic CHF (congestive heart failure) (HCC)   Acute metabolic encephalopathy   Tracheostomy status (HCC)   Chronic atrial fibrillation (HCC)   Pleural effusion, right   1. Acute on chronic respiratory failure with hypoxia failing to wean once again pleural effusion is back on the chest x-ray and also there is significant ascites the patient needs to have another thoracentesis which will help her wean however problem is able recur from the ascites.  Will discuss with primary care team started strategy for management. 2. Acute on chronic systolic heart failure we will continue with supportive care optimize heart failure management. 3. Metabolic encephalopathy is unchanged 4. Status post tracheostomy we will continue with supportive care 5. Chronic atrial fibrillation rate is controlled 6. Right-sided pleural effusion may need another thoracentesis however the source of the effusion needs to be addressed in the form of the ascites.   I have personally seen and evaluated the patient, evaluated laboratory and imaging results, formulated the assessment and plan and placed orders. The Patient requires high complexity decision making for assessment and support.  Case was discussed on Rounds with the Respiratory Therapy Staff  Yevonne Pax, MD [facility] Pulmonary Critical Care Medicine Sleep Medicine

## 2017-11-16 NOTE — Progress Notes (Signed)
ABI's have been completed. Right 1.08 Left 1.05  11/16/17 5:18 PM Olen Cordial RVT

## 2017-11-17 DIAGNOSIS — I5023 Acute on chronic systolic (congestive) heart failure: Secondary | ICD-10-CM | POA: Diagnosis not present

## 2017-11-17 DIAGNOSIS — G9341 Metabolic encephalopathy: Secondary | ICD-10-CM | POA: Diagnosis not present

## 2017-11-17 DIAGNOSIS — I482 Chronic atrial fibrillation: Secondary | ICD-10-CM | POA: Diagnosis not present

## 2017-11-17 DIAGNOSIS — J9621 Acute and chronic respiratory failure with hypoxia: Secondary | ICD-10-CM | POA: Diagnosis not present

## 2017-11-17 NOTE — Progress Notes (Signed)
Pulmonary Critical Care Medicine [facility] [place]   PULMONARY CRITICAL CARE SERVICE  PROGRESS NOTE  Date of Service: 11/17/2017  Glenda Walker  ZOX:[id number]  DOB: 07-16-1955   DOA: 10/31/2017  Referring Physician: Carron Curie, MD  HPI: Glenda Walker is a 62 y.o. female seen for follow up of Acute on Chronic Respiratory Failure.  Patient's on pressure support was attempted on T collar failed.  Will likely need to have the pleural fluid tapped again as well as consideration to the ascites.  Medications: Reviewed on Rounds  Physical Exam:  Vitals: Temperature 97.4 pulse 108 respiratory rate 25 blood pressure 145/31 saturations 100%  Ventilator Settings mode of ventilation pressure support FiO2 20% tidal volume 410 pressure support 12 PEEP 5  . General: Comfortable at this time . Eyes: Grossly normal lids, irises & conjunctiva . ENT: grossly tongue is normal . Neck: no obvious mass . Cardiovascular: S1 S2 normal no gallop . Respiratory: Coarse rhonchi noted . Abdomen: soft . Skin: no rash seen on limited exam . Musculoskeletal: not rigid . Psychiatric:unable to assess . Neurologic: no seizure no involuntary movements         Lab Data:   Basic Metabolic Panel: Recent Labs  Lab 11/11/17 0531 11/12/17 0657 11/14/17 0511 11/16/17 1123  NA 140 143 142 143  K 4.2 4.7 4.0 4.2  CL 105 104 106 107  CO2 27 28 29 27   GLUCOSE 113* 100* 108* 98  BUN 54* 50* 40* 32*  CREATININE 1.74* 1.72* 1.21* 0.99  CALCIUM 9.5 9.5 9.1 9.3  MG  --   --   --  2.0  PHOS  --   --   --  2.5    ABG: Recent Labs  Lab 11/11/17 1005 11/11/17 1750  PHART 7.287* 7.457*  PCO2ART 61.4* 41.6  PO2ART 46.3* 136*  HCO3 28.8* 28.9*  O2SAT 77.8 99.6    Liver Function Tests: Recent Labs  Lab 11/16/17 1123  ALBUMIN 2.2*   No results for input(s): LIPASE, AMYLASE in the last 168 hours. No results for input(s): AMMONIA in the last 168 hours.  CBC: Recent Labs  Lab  11/14/17 0511 11/16/17 1123  WBC 8.6 7.4  HGB 10.9* 10.1*  HCT 36.5 34.8*  MCV 96.8 98.3  PLT 171 173    Cardiac Enzymes: No results for input(s): CKTOTAL, CKMB, CKMBINDEX, TROPONINI in the last 168 hours.  BNP (last 3 results) No results for input(s): BNP in the last 8760 hours.  ProBNP (last 3 results) No results for input(s): PROBNP in the last 8760 hours.  Radiological Exams: Dg Chest Port 1 View  Result Date: 11/16/2017 CLINICAL DATA:  Tracheostomy.  Pleural effusion. EXAM: PORTABLE CHEST 1 VIEW COMPARISON:  11/11/2017 FINDINGS: Patient rotated minimally right. Tracheostomy is appropriately positioned. Nasogastric tube extends beyond the inferior aspect of the film. Prior median sternotomy. Cardiomegaly accentuated by AP portable technique. Small right pleural effusion is new or increased. No pneumothorax. Interstitial edema is mild and similar. Persistent left and worsened right base airspace disease. IMPRESSION: Slightly worsened right-sided aeration with small pleural effusion and developing atelectasis. Left base consolidation is not significantly changed. Cardiomegaly with mild congestive heart failure. Electronically Signed   By: Jeronimo Greaves M.D.   On: 11/16/2017 10:36   Ir Abdomen US Limited  Result Date: 11/15/2017 CLINICAL DATA:  62 year old with respiratory failure and encephalopathy. Patient was scheduled for a percutaneous gastrostomy tube placement. EXAM: LIMITED ABDOMEN ULTRASOUND FOR ASCITES TECHNIQUE: Limited ultrasound survey for ascites  was performed in all four abdominal quadrants. COMPARISON:  CT 11/10/2017 FINDINGS: Previous CT demonstrated a small amount of abdominal ascites. The abdomen was evaluated with ultrasound prior to the procedure. Moderate amount of ascites is noted around the right hepatic lobe. IMPRESSION: Moderate amount of ascites in the right upper quadrant. Percutaneous gastrostomy tube was canceled due to risk of peritonitis with the ascites.  Electronically Signed   By: Richarda Overlie M.D.   On: 11/15/2017 16:15    Assessment/Plan Principal Problem:   Acute on chronic respiratory failure with hypoxia (HCC) Active Problems:   Chronic respiratory failure with hypoxia (HCC)   Acute on chronic systolic CHF (congestive heart failure) (HCC)   Acute metabolic encephalopathy   Tracheostomy status (HCC)   Chronic atrial fibrillation (HCC)   Pleural effusion, right   1. Acute on chronic respiratory failure with hypoxia continue with pressure support at this time.  Again until thoracentesis is done might have difficulty weaning her once again. 2. Metabolic encephalopathy grossly unchanged continue with supportive care 3. Chronic atrial fibrillation rate is controlled we will continue to follow. 4. Right sided pleural effusion I think she needs a thoracentesis. 5. Tracheostomy trying to wean not able to decannulate right now 6. Acute on chronic systolic heart failure continue with supportive care diuresis tolerated   I have personally seen and evaluated the patient, evaluated laboratory and imaging results, formulated the assessment and plan and placed orders. The Patient requires high complexity decision making for assessment and support.  Case was discussed on Rounds with the Respiratory Therapy Staff  Yevonne Pax, MD [facility] Pulmonary Critical Care Medicine Sleep Medicine

## 2017-11-18 DIAGNOSIS — G9341 Metabolic encephalopathy: Secondary | ICD-10-CM | POA: Diagnosis not present

## 2017-11-18 DIAGNOSIS — I482 Chronic atrial fibrillation: Secondary | ICD-10-CM | POA: Diagnosis not present

## 2017-11-18 DIAGNOSIS — J9621 Acute and chronic respiratory failure with hypoxia: Secondary | ICD-10-CM | POA: Diagnosis not present

## 2017-11-18 DIAGNOSIS — I5023 Acute on chronic systolic (congestive) heart failure: Secondary | ICD-10-CM | POA: Diagnosis not present

## 2017-11-18 LAB — MAGNESIUM: MAGNESIUM: 2 mg/dL (ref 1.7–2.4)

## 2017-11-18 LAB — RENAL FUNCTION PANEL
ALBUMIN: 2.1 g/dL — AB (ref 3.5–5.0)
ANION GAP: 7 (ref 5–15)
BUN: 28 mg/dL — ABNORMAL HIGH (ref 8–23)
CALCIUM: 9.6 mg/dL (ref 8.9–10.3)
CO2: 30 mmol/L (ref 22–32)
Chloride: 106 mmol/L (ref 98–111)
Creatinine, Ser: 0.96 mg/dL (ref 0.44–1.00)
Glucose, Bld: 74 mg/dL (ref 70–99)
PHOSPHORUS: 2.6 mg/dL (ref 2.5–4.6)
Potassium: 4.5 mmol/L (ref 3.5–5.1)
SODIUM: 143 mmol/L (ref 135–145)

## 2017-11-18 LAB — CBC
HCT: 33.5 % — ABNORMAL LOW (ref 36.0–46.0)
HEMOGLOBIN: 10 g/dL — AB (ref 12.0–15.0)
MCH: 29.1 pg (ref 26.0–34.0)
MCHC: 29.9 g/dL — ABNORMAL LOW (ref 30.0–36.0)
MCV: 97.4 fL (ref 78.0–100.0)
Platelets: 192 10*3/uL (ref 150–400)
RBC: 3.44 MIL/uL — AB (ref 3.87–5.11)
RDW: 22.2 % — ABNORMAL HIGH (ref 11.5–15.5)
WBC: 6.9 10*3/uL (ref 4.0–10.5)

## 2017-11-18 NOTE — Progress Notes (Signed)
Pulmonary Critical Care Medicine [facility] [place]   PULMONARY CRITICAL CARE SERVICE  PROGRESS NOTE  Date of Service: 11/18/2017  Glenda Walker  MBE:[id number]  DOB: [birth year]   DOA: 10/31/2017  Referring Physician: Carron Curie, MD  HPI: Glenda Walker is a 62 y.o. female seen for follow up of Acute on Chronic Respiratory Failure.  Patient is on assist control mode has been attempted at weaning did not tolerate it  Medications: Reviewed on Rounds  Physical Exam:  Vitals: Temperature 98.9 pulse 88 respiratory 21 blood pressure 120/76 saturations 100%  Ventilator Settings mode of ventilation assist control FiO2 20% tidal volume 426 PEEP 5  . General: Comfortable at this time . Eyes: Grossly normal lids, irises & conjunctiva . ENT: grossly tongue is normal . Neck: no obvious mass . Cardiovascular: S1 S2 normal no gallop . Respiratory: No rhonchi or rales . Abdomen: soft . Skin: no rash seen on limited exam . Musculoskeletal: not rigid . Psychiatric:unable to assess . Neurologic: no seizure no involuntary movements         Lab Data:   Basic Metabolic Panel: Recent Labs  Lab 11/12/17 0657 11/14/17 0511 11/16/17 1123 11/18/17 0940  NA 143 142 143 143  K 4.7 4.0 4.2 4.5  CL 104 106 107 106  CO2 28 29 27 30   GLUCOSE 100* 108* 98 74  BUN 50* 40* 32* 28*  CREATININE 1.72* 1.21* 0.99 0.96  CALCIUM 9.5 9.1 9.3 9.6  MG  --   --  2.0 2.0  PHOS  --   --  2.5 2.6    ABG: Recent Labs  Lab 11/11/17 1750  PHART 7.457*  PCO2ART 41.6  PO2ART 136*  HCO3 28.9*  O2SAT 99.6    Liver Function Tests: Recent Labs  Lab 11/16/17 1123 11/18/17 0940  ALBUMIN 2.2* 2.1*   No results for input(s): LIPASE, AMYLASE in the last 168 hours. No results for input(s): AMMONIA in the last 168 hours.  CBC: Recent Labs  Lab 11/14/17 0511 11/16/17 1123 11/18/17 0940  WBC 8.6 7.4 6.9  HGB 10.9* 10.1* 10.0*  HCT 36.5 34.8* 33.5*  MCV 96.8 98.3 97.4  PLT 171 173  192    Cardiac Enzymes: No results for input(s): CKTOTAL, CKMB, CKMBINDEX, TROPONINI in the last 168 hours.  BNP (last 3 results) No results for input(s): BNP in the last 8760 hours.  ProBNP (last 3 results) No results for input(s): PROBNP in the last 8760 hours.  Radiological Exams: No results found.  Assessment/Plan Principal Problem:   Acute on chronic respiratory failure with hypoxia (HCC) Active Problems:   Chronic respiratory failure with hypoxia (HCC)   Acute on chronic systolic CHF (congestive heart failure) (HCC)   Acute metabolic encephalopathy   Tracheostomy status (HCC)   Chronic atrial fibrillation (HCC)   Pleural effusion, right   1. Acute on chronic respiratory failure with hypoxia we will continue to monitor 2. Pressure support if patient is able to tolerate check the spontaneous breathing index. 3. Acute on chronic systolic heart failure continue with supportive care 4. Metabolic encephalopathy chronic atrial fibrillation rate is controlled we will follow 5. Right-sided pleural effusions status post thoracentesis   I have personally seen and evaluated the patient, evaluated laboratory and imaging results, formulated the assessment and plan and placed orders. The Patient requires high complexity decision making for assessment and support.  Case was discussed on Rounds with the Respiratory Therapy Staff  Yevonne Pax, MD [facility] Pulmonary Critical Care  Medicine Sleep Medicine

## 2017-11-19 ENCOUNTER — Other Ambulatory Visit (HOSPITAL_COMMUNITY): Payer: Self-pay

## 2017-11-19 DIAGNOSIS — J9621 Acute and chronic respiratory failure with hypoxia: Secondary | ICD-10-CM | POA: Diagnosis not present

## 2017-11-19 DIAGNOSIS — I5023 Acute on chronic systolic (congestive) heart failure: Secondary | ICD-10-CM | POA: Diagnosis not present

## 2017-11-19 DIAGNOSIS — I482 Chronic atrial fibrillation: Secondary | ICD-10-CM | POA: Diagnosis not present

## 2017-11-19 DIAGNOSIS — G9341 Metabolic encephalopathy: Secondary | ICD-10-CM | POA: Diagnosis not present

## 2017-11-19 NOTE — Progress Notes (Signed)
Pulmonary Critical Care Medicine [facility] [place]   PULMONARY CRITICAL CARE SERVICE  PROGRESS NOTE  Date of Service: 11/19/2017  Glenda Walker  HYQ:[id number]  DOB: [birth year]   DOA: 10/31/2017  Referring Physician: Carron Curie, MD  HPI: Glenda Walker is a 62 y.o. female seen for follow up of Acute on Chronic Respiratory Failure.  Patient is on pressure support mode at this time is been on 28% oxygen with pressure support of 12 PEEP 5  Medications: Reviewed on Rounds  Physical Exam:  Vitals: Temperature 97.9 pulse 104 respiratory rate 19 blood pressure 142/78 saturations 100%  Ventilator Settings mode of ventilation pressure support FiO2 28% tidal volume 330 for pressure support 12 PEEP 5  . General: Comfortable at this time . Eyes: Grossly normal lids, irises & conjunctiva . ENT: grossly tongue is normal . Neck: no obvious mass . Cardiovascular: S1 S2 normal no gallop . Respiratory: No rhonchi no rales . Abdomen: soft . Skin: no rash seen on limited exam . Musculoskeletal: not rigid . Psychiatric:unable to assess . Neurologic: no seizure no involuntary movements         Lab Data:   Basic Metabolic Panel: Recent Labs  Lab 11/14/17 0511 11/16/17 1123 11/18/17 0940  NA 142 143 143  K 4.0 4.2 4.5  CL 106 107 106  CO2 29 27 30   GLUCOSE 108* 98 74  BUN 40* 32* 28*  CREATININE 1.21* 0.99 0.96  CALCIUM 9.1 9.3 9.6  MG  --  2.0 2.0  PHOS  --  2.5 2.6    ABG: No results for input(s): PHART, PCO2ART, PO2ART, HCO3, O2SAT in the last 168 hours.  Liver Function Tests: Recent Labs  Lab 11/16/17 1123 11/18/17 0940  ALBUMIN 2.2* 2.1*   No results for input(s): LIPASE, AMYLASE in the last 168 hours. No results for input(s): AMMONIA in the last 168 hours.  CBC: Recent Labs  Lab 11/14/17 0511 11/16/17 1123 11/18/17 0940  WBC 8.6 7.4 6.9  HGB 10.9* 10.1* 10.0*  HCT 36.5 34.8* 33.5*  MCV 96.8 98.3 97.4  PLT 171 173 192    Cardiac  Enzymes: No results for input(s): CKTOTAL, CKMB, CKMBINDEX, TROPONINI in the last 168 hours.  BNP (last 3 results) No results for input(s): BNP in the last 8760 hours.  ProBNP (last 3 results) No results for input(s): PROBNP in the last 8760 hours.  Radiological Exams: Dg Abd Portable 1v  Result Date: 11/19/2017 CLINICAL DATA:  Bowel ileus EXAM: PORTABLE ABDOMEN - 1 VIEW COMPARISON:  CT abdomen 11/10/2017 FINDINGS: Nasogastric tube projects within the stomach. Mild gaseous prominence of the large bowel, with transverse colon caliber 8.4 cm. No appreciably dilated small bowel. Tubal ligation clips along the pelvis. Centralization of bowel loops favoring ascites. IMPRESSION: 1. Nonspecific mild gaseous prominence of the large bowel. This certainly could correlate with a functional disturbance such as ileus. No dilated small bowel is currently identified. 2. Nasogastric tube is in the stomach. 3. Centralization of small bowel loops and hazy density along the abdomen compatible with ascites. Electronically Signed   By: Gaylyn Rong M.D.   On: 11/19/2017 08:59    Assessment/Plan Principal Problem:   Acute on chronic respiratory failure with hypoxia (HCC) Active Problems:   Chronic respiratory failure with hypoxia (HCC)   Acute on chronic systolic CHF (congestive heart failure) (HCC)   Acute metabolic encephalopathy   Tracheostomy status (HCC)   Chronic atrial fibrillation (HCC)   Pleural effusion, right   1.  Acute on chronic respiratory failure with hypoxia we will continue to wean on pressure support as tolerated she has not been tolerating the T bar.  She is grossly fluid overloaded need for more aggressive diuresis 2. Acute on chronic systolic heart failure as mentioned she is grossly fluid overloaded needs diuresis. 3. Chronic atrial fibrillation rate controlled at this time 4. Metabolic encephalopathy grossly unchanged 5. Pleural effusion status post thoracentesis 6. Ascites  discussed with primary care regarding the management   I have personally seen and evaluated the patient, evaluated laboratory and imaging results, formulated the assessment and plan and placed orders. The Patient requires high complexity decision making for assessment and support.  Case was discussed on Rounds with the Respiratory Therapy Staff  Yevonne Pax, MD [facility] Pulmonary Critical Care Medicine Sleep Medicine

## 2017-11-20 ENCOUNTER — Other Ambulatory Visit (HOSPITAL_COMMUNITY): Payer: Self-pay

## 2017-11-20 DIAGNOSIS — J9621 Acute and chronic respiratory failure with hypoxia: Secondary | ICD-10-CM | POA: Diagnosis not present

## 2017-11-20 DIAGNOSIS — I5023 Acute on chronic systolic (congestive) heart failure: Secondary | ICD-10-CM | POA: Diagnosis not present

## 2017-11-20 DIAGNOSIS — I482 Chronic atrial fibrillation: Secondary | ICD-10-CM | POA: Diagnosis not present

## 2017-11-20 DIAGNOSIS — G9341 Metabolic encephalopathy: Secondary | ICD-10-CM | POA: Diagnosis not present

## 2017-11-20 LAB — RENAL FUNCTION PANEL
ALBUMIN: 2 g/dL — AB (ref 3.5–5.0)
ANION GAP: 7 (ref 5–15)
BUN: 30 mg/dL — ABNORMAL HIGH (ref 8–23)
CALCIUM: 9.5 mg/dL (ref 8.9–10.3)
CO2: 28 mmol/L (ref 22–32)
Chloride: 105 mmol/L (ref 98–111)
Creatinine, Ser: 0.94 mg/dL (ref 0.44–1.00)
Glucose, Bld: 100 mg/dL — ABNORMAL HIGH (ref 70–99)
PHOSPHORUS: 2.7 mg/dL (ref 2.5–4.6)
Potassium: 4.5 mmol/L (ref 3.5–5.1)
SODIUM: 140 mmol/L (ref 135–145)

## 2017-11-20 LAB — MAGNESIUM: Magnesium: 1.8 mg/dL (ref 1.7–2.4)

## 2017-11-20 LAB — CBC
HCT: 31.7 % — ABNORMAL LOW (ref 36.0–46.0)
HEMOGLOBIN: 9.2 g/dL — AB (ref 12.0–15.0)
MCH: 28.7 pg (ref 26.0–34.0)
MCHC: 29 g/dL — ABNORMAL LOW (ref 30.0–36.0)
MCV: 98.8 fL (ref 78.0–100.0)
Platelets: 126 10*3/uL — ABNORMAL LOW (ref 150–400)
RBC: 3.21 MIL/uL — AB (ref 3.87–5.11)
RDW: 22 % — ABNORMAL HIGH (ref 11.5–15.5)
WBC: 5.3 10*3/uL (ref 4.0–10.5)

## 2017-11-20 NOTE — Progress Notes (Signed)
Pulmonary Critical Care Medicine [facility] [place]   PULMONARY CRITICAL CARE SERVICE  PROGRESS NOTE  Date of Service: 11/20/2017  Glenda Walker  WUJ:[id number]  DOB: 08-03-1955   DOA: 10/31/2017  Referring Physician: Carron Curie, MD  HPI: Glenda Walker is a 62 y.o. female seen for follow up of Acute on Chronic Respiratory Failure.  Patient is on pressure support goal is about 16 hours  Medications: Reviewed on Rounds  Physical Exam:  Vitals: Temperature 97.8 pulse 86 respiratory rate 18 blood pressure 127/75 saturations 100%  Ventilator Settings mode of ventilation pressure support FiO2 20% tidal volume 600 pressure support 12 PEEP 5  . General: Comfortable at this time . Eyes: Grossly normal lids, irises & conjunctiva . ENT: grossly tongue is normal . Neck: no obvious mass . Cardiovascular: S1 S2 normal no gallop . Respiratory: Clear . Abdomen: soft . Skin: no rash seen on limited exam . Musculoskeletal: not rigid . Psychiatric:unable to assess . Neurologic: no seizure no involuntary movements         Lab Data:   Basic Metabolic Panel: Recent Labs  Lab 11/14/17 0511 11/16/17 1123 11/18/17 0940 11/20/17 0633  NA 142 143 143 140  K 4.0 4.2 4.5 4.5  CL 106 107 106 105  CO2 29 27 30 28   GLUCOSE 108* 98 74 100*  BUN 40* 32* 28* 30*  CREATININE 1.21* 0.99 0.96 0.94  CALCIUM 9.1 9.3 9.6 9.5  MG  --  2.0 2.0 1.8  PHOS  --  2.5 2.6 2.7    ABG: No results for input(s): PHART, PCO2ART, PO2ART, HCO3, O2SAT in the last 168 hours.  Liver Function Tests: Recent Labs  Lab 11/16/17 1123 11/18/17 0940 11/20/17 0633  ALBUMIN 2.2* 2.1* 2.0*   No results for input(s): LIPASE, AMYLASE in the last 168 hours. No results for input(s): AMMONIA in the last 168 hours.  CBC: Recent Labs  Lab 11/14/17 0511 11/16/17 1123 11/18/17 0940 11/20/17 0633  WBC 8.6 7.4 6.9 5.3  HGB 10.9* 10.1* 10.0* 9.2*  HCT 36.5 34.8* 33.5* 31.7*  MCV 96.8 98.3 97.4 98.8   PLT 171 173 192 126*    Cardiac Enzymes: No results for input(s): CKTOTAL, CKMB, CKMBINDEX, TROPONINI in the last 168 hours.  BNP (last 3 results) No results for input(s): BNP in the last 8760 hours.  ProBNP (last 3 results) No results for input(s): PROBNP in the last 8760 hours.  Radiological Exams: Dg Chest Port 1 View  Result Date: 11/20/2017 CLINICAL DATA:  Pleural effusion follow up EXAM: PORTABLE CHEST 1 VIEW COMPARISON:  November 16, 2016 FINDINGS: A tracheostomy tube is in good position. The NG tube terminates below today's film. No pneumothorax. Persistent effusion and underlying opacity on the right. Persistent retrocardiac opacity on the left. Stable cardiomegaly. No other change. IMPRESSION: 1. Support apparatus as above. 2. Persistent effusion underlying opacity on the right. 3. Persistent opacity in the left retrocardiac region. Electronically Signed   By: Gerome Sam III M.D   On: 11/20/2017 09:01   Dg Abd Portable 1v  Result Date: 11/19/2017 CLINICAL DATA:  Bowel ileus EXAM: PORTABLE ABDOMEN - 1 VIEW COMPARISON:  CT abdomen 11/10/2017 FINDINGS: Nasogastric tube projects within the stomach. Mild gaseous prominence of the large bowel, with transverse colon caliber 8.4 cm. No appreciably dilated small bowel. Tubal ligation clips along the pelvis. Centralization of bowel loops favoring ascites. IMPRESSION: 1. Nonspecific mild gaseous prominence of the large bowel. This certainly could correlate with a functional  disturbance such as ileus. No dilated small bowel is currently identified. 2. Nasogastric tube is in the stomach. 3. Centralization of small bowel loops and hazy density along the abdomen compatible with ascites. Electronically Signed   By: Gaylyn Rong M.D.   On: 11/19/2017 08:59    Assessment/Plan Principal Problem:   Acute on chronic respiratory failure with hypoxia (HCC) Active Problems:   Chronic respiratory failure with hypoxia (HCC)   Acute on chronic  systolic CHF (congestive heart failure) (HCC)   Acute metabolic encephalopathy   Tracheostomy status (HCC)   Chronic atrial fibrillation (HCC)   Pleural effusion, right   1. Acute on chronic respiratory failure with hypoxia we will continue to wean as mentioned the goal is 16 hours continue pulmonary toilet supportive care. 2. Acute on chronic systolic heart failure continue to follow the x-rays monitor fluid status diuresis tolerated 3. Acute metabolic encephalopathy she is unchanged 4. Chronic atrial fibrillation rate controlled 5. Pleural effusion status post thoracentesis has had reaccumulation because of ascites   I have personally seen and evaluated the patient, evaluated laboratory and imaging results, formulated the assessment and plan and placed orders. The Patient requires high complexity decision making for assessment and support.  Case was discussed on Rounds with the Respiratory Therapy Staff  Yevonne Pax, MD [facility] Pulmonary Critical Care Medicine Sleep Medicine

## 2017-11-21 ENCOUNTER — Other Ambulatory Visit (HOSPITAL_COMMUNITY): Payer: Self-pay

## 2017-11-21 DIAGNOSIS — I482 Chronic atrial fibrillation: Secondary | ICD-10-CM | POA: Diagnosis not present

## 2017-11-21 DIAGNOSIS — G9341 Metabolic encephalopathy: Secondary | ICD-10-CM | POA: Diagnosis not present

## 2017-11-21 DIAGNOSIS — J9621 Acute and chronic respiratory failure with hypoxia: Secondary | ICD-10-CM | POA: Diagnosis not present

## 2017-11-21 DIAGNOSIS — I5023 Acute on chronic systolic (congestive) heart failure: Secondary | ICD-10-CM | POA: Diagnosis not present

## 2017-11-21 LAB — RENAL FUNCTION PANEL
ALBUMIN: 2.2 g/dL — AB (ref 3.5–5.0)
ANION GAP: 8 (ref 5–15)
BUN: 31 mg/dL — AB (ref 8–23)
CHLORIDE: 103 mmol/L (ref 98–111)
CO2: 30 mmol/L (ref 22–32)
Calcium: 9.7 mg/dL (ref 8.9–10.3)
Creatinine, Ser: 0.94 mg/dL (ref 0.44–1.00)
GFR calc Af Amer: >60 mL/min (ref >60)
Glucose, Bld: 103 mg/dL — ABNORMAL HIGH (ref 70–99)
PHOSPHORUS: 2.9 mg/dL (ref 2.5–4.6)
POTASSIUM: 4.2 mmol/L (ref 3.5–5.1)
Sodium: 141 mmol/L (ref 135–145)

## 2017-11-21 LAB — CBC
HCT: 34.2 % — ABNORMAL LOW (ref 36.0–46.0)
Hemoglobin: 10 g/dL — ABNORMAL LOW (ref 12.0–15.0)
MCH: 28.6 pg (ref 26.0–34.0)
MCHC: 29.2 g/dL — ABNORMAL LOW (ref 30.0–36.0)
MCV: 97.7 fL (ref 78.0–100.0)
PLATELETS: 225 10*3/uL (ref 150–400)
RBC: 3.5 MIL/uL — AB (ref 3.87–5.11)
RDW: 21.9 % — ABNORMAL HIGH (ref 11.5–15.5)
WBC: 6.3 10*3/uL (ref 4.0–10.5)

## 2017-11-21 LAB — MAGNESIUM: Magnesium: 1.8 mg/dL (ref 1.7–2.4)

## 2017-11-21 MED ORDER — LIDOCAINE HCL (PF) 1 % IJ SOLN
INTRAMUSCULAR | Status: AC
  Filled 2017-11-21: qty 5

## 2017-11-21 NOTE — Procedures (Signed)
PROCEDURE SUMMARY:  Successful image-guided right thoracentesis. Yielded 1.4 liters of clear gold fluid. Patient tolerated procedure well. No immediate complications.  Specimen was not sent for labs. CXR ordered.  Gordy Councilman Ilayda Toda PA-C 11/21/2017 10:58 AM

## 2017-11-21 NOTE — Progress Notes (Signed)
Pulmonary Critical Care Medicine [facility] [place]   PULMONARY CRITICAL CARE SERVICE  PROGRESS NOTE  Date of Service: 11/21/2017  Glenda Walker  PYK:[id number]  DOB: November 26, 1955   DOA: 10/31/2017  Referring Physician: Carron Curie, MD  HPI: Glenda Walker is a 62 y.o. female seen for follow up of Acute on Chronic Respiratory Failure.  Patient underwent thoracentesis with removal of 1.4 L from the right side.  Patient tolerated procedure fairly well.  This will hopefully improve her respiratory mechanics  Medications: Reviewed on Rounds  Physical Exam:  Vitals: Temperature 97.3 pulse 94 respiratory rate 24 blood pressure 118/65 saturation 95%  Ventilator Settings off the ventilator on T collar  . General: Comfortable at this time . Eyes: Grossly normal lids, irises & conjunctiva . ENT: grossly tongue is normal . Neck: no obvious mass . Cardiovascular: S1 S2 normal no gallop . Respiratory: No rhonchi no rales . Abdomen: soft . Skin: no rash seen on limited exam . Musculoskeletal: not rigid . Psychiatric:unable to assess . Neurologic: no seizure no involuntary movements         Lab Data:   Basic Metabolic Panel: Recent Labs  Lab 11/16/17 1123 11/18/17 0940 11/20/17 0633 11/21/17 0715  NA 143 143 140 141  K 4.2 4.5 4.5 4.2  CL 107 106 105 103  CO2 27 30 28 30   GLUCOSE 98 74 100* 103*  BUN 32* 28* 30* 31*  CREATININE 0.99 0.96 0.94 0.94  CALCIUM 9.3 9.6 9.5 9.7  MG 2.0 2.0 1.8 1.8  PHOS 2.5 2.6 2.7 2.9    ABG: No results for input(s): PHART, PCO2ART, PO2ART, HCO3, O2SAT in the last 168 hours.  Liver Function Tests: Recent Labs  Lab 11/16/17 1123 11/18/17 0940 11/20/17 0633 11/21/17 0715  ALBUMIN 2.2* 2.1* 2.0* 2.2*   No results for input(s): LIPASE, AMYLASE in the last 168 hours. No results for input(s): AMMONIA in the last 168 hours.  CBC: Recent Labs  Lab 11/16/17 1123 11/18/17 0940 11/20/17 0633 11/21/17 0715  WBC 7.4 6.9 5.3  6.3  HGB 10.1* 10.0* 9.2* 10.0*  HCT 34.8* 33.5* 31.7* 34.2*  MCV 98.3 97.4 98.8 97.7  PLT 173 192 126* 225    Cardiac Enzymes: No results for input(s): CKTOTAL, CKMB, CKMBINDEX, TROPONINI in the last 168 hours.  BNP (last 3 results) No results for input(s): BNP in the last 8760 hours.  ProBNP (last 3 results) No results for input(s): PROBNP in the last 8760 hours.  Radiological Exams: Dg Chest Port 1 View  Result Date: 11/21/2017 CLINICAL DATA:  Status post thoracentesis EXAM: PORTABLE CHEST 1 VIEW COMPARISON:  11/20/2017 FINDINGS: Reduction in right-sided pleural effusion is noted following thoracentesis. No pneumothorax is seen. Mild vascular congestion is noted increased from the prior exam. A portion of this may be related to changes from re-expansion. Nasogastric catheter and tracheostomy tube are noted in satisfactory position. Postsurgical changes are again noted. IMPRESSION: No pneumothorax following right thoracentesis. Mild vascular congestion is noted portion which may be related to re-expansion. Electronically Signed   By: Alcide Clever M.D.   On: 11/21/2017 13:11   Dg Chest Port 1 View  Result Date: 11/20/2017 CLINICAL DATA:  Pleural effusion follow up EXAM: PORTABLE CHEST 1 VIEW COMPARISON:  November 16, 2016 FINDINGS: A tracheostomy tube is in good position. The NG tube terminates below today's film. No pneumothorax. Persistent effusion and underlying opacity on the right. Persistent retrocardiac opacity on the left. Stable cardiomegaly. No other change. IMPRESSION:  1. Support apparatus as above. 2. Persistent effusion underlying opacity on the right. 3. Persistent opacity in the left retrocardiac region. Electronically Signed   By: Gerome Sam III M.D   On: 11/20/2017 09:01   US Thoracentesis Asp Pleural Space W/img Guide  Result Date: 11/21/2017 INDICATION: Patient with history of dyspnea and recurrent right pleural effusion. Request is made for therapeutic right  thoracentesis. EXAM: ULTRASOUND GUIDED THERAPEUTIC RIGHT THORACENTESIS MEDICATIONS: 10 mL of 2% lidocaine COMPLICATIONS: None immediate. PROCEDURE: An ultrasound guided thoracentesis was thoroughly discussed with the patient and questions answered. The benefits, risks, alternatives and complications were also discussed. The patient understands and wishes to proceed with the procedure. Written consent was obtained. Ultrasound was performed to localize and mark an adequate pocket of fluid in the right chest. The area was then prepped and draped in the normal sterile fashion. 2% Lidocaine was used for local anesthesia. Under ultrasound guidance a 6 Fr Safe-T-Centesis catheter was introduced. Thoracentesis was performed. The catheter was removed and a dressing applied. FINDINGS: A total of approximately 1.4 L of clear gold fluid was removed. IMPRESSION: Successful ultrasound guided right thoracentesis yielding 1.4 L of pleural fluid. Read by: Elwin Mocha, PA-C No pneumothorax on follow-up chest radiograph. Electronically Signed   By: Corlis Leak M.D.   On: 11/21/2017 13:45    Assessment/Plan Principal Problem:   Acute on chronic respiratory failure with hypoxia (HCC) Active Problems:   Chronic respiratory failure with hypoxia (HCC)   Acute on chronic systolic CHF (congestive heart failure) (HCC)   Acute metabolic encephalopathy   Tracheostomy status (HCC)   Chronic atrial fibrillation (HCC)   Pleural effusion, right   1. Acute on chronic respiratory failure with hypoxia continue with weaning on T collar FiO2 35% we will see her as long she is able to do this since she had a thoracentesis to 2. Acute on chronic systolic heart failure continue with present management effusion will likely recur 3. Ascites as above source of the effusion is the ascites 4. Metabolic encephalopathy grossly unchanged 5. Chronic atrial fibrillation rate is controlled 6. Right-sided pleural effusion status post  thoracentesis of 1.4 L   I have personally seen and evaluated the patient, evaluated laboratory and imaging results, formulated the assessment and plan and placed orders. The Patient requires high complexity decision making for assessment and support.  Case was discussed on Rounds with the Respiratory Therapy Staff  Yevonne Pax, MD [facility] Pulmonary Critical Care Medicine Sleep Medicine

## 2017-11-22 ENCOUNTER — Other Ambulatory Visit (HOSPITAL_COMMUNITY): Payer: Self-pay

## 2017-11-22 DIAGNOSIS — G9341 Metabolic encephalopathy: Secondary | ICD-10-CM | POA: Diagnosis not present

## 2017-11-22 DIAGNOSIS — J9621 Acute and chronic respiratory failure with hypoxia: Secondary | ICD-10-CM | POA: Diagnosis not present

## 2017-11-22 DIAGNOSIS — R188 Other ascites: Secondary | ICD-10-CM

## 2017-11-22 DIAGNOSIS — I5023 Acute on chronic systolic (congestive) heart failure: Secondary | ICD-10-CM | POA: Diagnosis not present

## 2017-11-22 DIAGNOSIS — N179 Acute kidney failure, unspecified: Secondary | ICD-10-CM | POA: Diagnosis not present

## 2017-11-22 MED ORDER — LIDOCAINE HCL (PF) 1 % IJ SOLN
INTRAMUSCULAR | Status: AC
  Filled 2017-11-22: qty 30

## 2017-11-22 NOTE — Progress Notes (Signed)
  I was present during limited US of the abdomen to evaluate for ascites for possible paracentesis.  There was only a small amount of ascites.  There was no adequate pocket of fluid to allow for safe approach for paracentesis.  Ashleen Demma S Arriel Victor PA-C 11/22/2017 3:59 PM

## 2017-11-22 NOTE — Progress Notes (Signed)
Pulmonary Critical Care Medicine [facility] [place]   PULMONARY CRITICAL CARE SERVICE  PROGRESS NOTE  Date of Service: 11/22/2017  Glenda Walker  BSJ:[id number]  DOB: [birth year]   DOA: 10/31/2017  Referring Physician: Carron Curie, MD  HPI: Glenda Walker is a 61 y.o. female seen for follow up of Acute on Chronic Respiratory Failure.  Patient is on T collar she had a thoracentesis done yesterday and this actually helped quite a bit after removing 1.4 L from her chest she is now able to wean.  Unfortunately the fluid will more than likely recur because of the ascites.  As discussed on rounds will need to come up with a permanent solution and 1 of the possibilities is placing a tube to drain the fluid and then doing a pleurodesis.  Ultimately she would probably benefit from a shunt however I do not think that this is going to happen and palliative care should be considered  Medications: Reviewed on Rounds  Physical Exam:  Vitals: Temperature 97.0 pulse 105 respiratory rate 17 blood pressure 139/97 saturation 96%  Ventilator Settings off the ventilator on T collar currently 40% FiO2  . General: Comfortable at this time . Eyes: Grossly normal lids, irises & conjunctiva . ENT: grossly tongue is normal . Neck: no obvious mass . Cardiovascular: S1 S2 normal no gallop . Respiratory: Few rhonchi are noted . Abdomen: soft . Skin: no rash seen on limited exam . Musculoskeletal: not rigid . Psychiatric:unable to assess . Neurologic: no seizure no involuntary movements         Lab Data:   Basic Metabolic Panel: Recent Labs  Lab 11/16/17 1123 11/18/17 0940 11/20/17 0633 11/21/17 0715  NA 143 143 140 141  K 4.2 4.5 4.5 4.2  CL 107 106 105 103  CO2 27 30 28 30   GLUCOSE 98 74 100* 103*  BUN 32* 28* 30* 31*  CREATININE 0.99 0.96 0.94 0.94  CALCIUM 9.3 9.6 9.5 9.7  MG 2.0 2.0 1.8 1.8  PHOS 2.5 2.6 2.7 2.9    ABG: No results for input(s): PHART, PCO2ART, PO2ART,  HCO3, O2SAT in the last 168 hours.  Liver Function Tests: Recent Labs  Lab 11/16/17 1123 11/18/17 0940 11/20/17 0633 11/21/17 0715  ALBUMIN 2.2* 2.1* 2.0* 2.2*   No results for input(s): LIPASE, AMYLASE in the last 168 hours. No results for input(s): AMMONIA in the last 168 hours.  CBC: Recent Labs  Lab 11/16/17 1123 11/18/17 0940 11/20/17 0633 11/21/17 0715  WBC 7.4 6.9 5.3 6.3  HGB 10.1* 10.0* 9.2* 10.0*  HCT 34.8* 33.5* 31.7* 34.2*  MCV 98.3 97.4 98.8 97.7  PLT 173 192 126* 225    Cardiac Enzymes: No results for input(s): CKTOTAL, CKMB, CKMBINDEX, TROPONINI in the last 168 hours.  BNP (last 3 results) No results for input(s): BNP in the last 8760 hours.  ProBNP (last 3 results) No results for input(s): PROBNP in the last 8760 hours.  Radiological Exams: US Abdomen Limited  Result Date: 11/22/2017 CLINICAL DATA:  Evaluate for ascites. EXAM: LIMITED ABDOMEN ULTRASOUND FOR ASCITES TECHNIQUE: Limited ultrasound survey for ascites was performed in all four abdominal quadrants. COMPARISON:  CT 11/10/2017 FINDINGS: Small volume abdominopelvic ascites, degree of which appears similar to recent CT. Greatest situs volume in the right abdomen. IMPRESSION: Small volume abdominopelvic ascites. Electronically Signed   By: Narda Rutherford M.D.   On: 11/22/2017 01:09   Dg Chest Port 1 View  Result Date: 11/21/2017 CLINICAL DATA:  Status post  thoracentesis EXAM: PORTABLE CHEST 1 VIEW COMPARISON:  11/20/2017 FINDINGS: Reduction in right-sided pleural effusion is noted following thoracentesis. No pneumothorax is seen. Mild vascular congestion is noted increased from the prior exam. A portion of this may be related to changes from re-expansion. Nasogastric catheter and tracheostomy tube are noted in satisfactory position. Postsurgical changes are again noted. IMPRESSION: No pneumothorax following right thoracentesis. Mild vascular congestion is noted portion which may be related to  re-expansion. Electronically Signed   By: Alcide Clever M.D.   On: 11/21/2017 13:11   US Thoracentesis Asp Pleural Space W/img Guide  Result Date: 11/21/2017 INDICATION: Patient with history of dyspnea and recurrent right pleural effusion. Request is made for therapeutic right thoracentesis. EXAM: ULTRASOUND GUIDED THERAPEUTIC RIGHT THORACENTESIS MEDICATIONS: 10 mL of 2% lidocaine COMPLICATIONS: None immediate. PROCEDURE: An ultrasound guided thoracentesis was thoroughly discussed with the patient and questions answered. The benefits, risks, alternatives and complications were also discussed. The patient understands and wishes to proceed with the procedure. Written consent was obtained. Ultrasound was performed to localize and mark an adequate pocket of fluid in the right chest. The area was then prepped and draped in the normal sterile fashion. 2% Lidocaine was used for local anesthesia. Under ultrasound guidance a 6 Fr Safe-T-Centesis catheter was introduced. Thoracentesis was performed. The catheter was removed and a dressing applied. FINDINGS: A total of approximately 1.4 L of clear gold fluid was removed. IMPRESSION: Successful ultrasound guided right thoracentesis yielding 1.4 L of pleural fluid. Read by: Elwin Mocha, PA-C No pneumothorax on follow-up chest radiograph. Electronically Signed   By: Corlis Leak M.D.   On: 11/21/2017 13:45    Assessment/Plan Principal Problem:   Acute on chronic respiratory failure with hypoxia (HCC) Active Problems:   Chronic respiratory failure with hypoxia (HCC)   Acute on chronic systolic CHF (congestive heart failure) (HCC)   Acute metabolic encephalopathy   Tracheostomy status (HCC)   Chronic atrial fibrillation (HCC)   Pleural effusion, right   1. Acute on chronic respiratory failure with hypoxia continue weaning on the T collar continue pulmonary toilet secretion management.  Patient is on 40% FiO2 2. Right-sided pleural effusion recurrent I would  consider doing a pleurodesis if we can discuss this further with the radiology I have spoken with the primary care team and they are going to call and find out.  This would be more for palliation and anything else so that we could possibly get her off of the ventilator. 3. Chronic atrial fibrillation rate is controlled at this time 4. Metabolic encephalopathy grossly unchanged. 5. Acute on chronic systolic heart failure diuresis tolerated follow the labs closely.   I have personally seen and evaluated the patient, evaluated laboratory and imaging results, formulated the assessment and plan and placed orders. The Patient requires high complexity decision making for assessment and support.  Case was discussed on Rounds with the Respiratory Therapy Staff time 35 minutes discussion with the medical staff team and the primary care team on rounds and review of the chart  Yevonne Pax, MD [facility] Pulmonary Critical Care Medicine Sleep Medicine

## 2017-11-23 DIAGNOSIS — I5023 Acute on chronic systolic (congestive) heart failure: Secondary | ICD-10-CM | POA: Diagnosis not present

## 2017-11-23 DIAGNOSIS — I482 Chronic atrial fibrillation: Secondary | ICD-10-CM | POA: Diagnosis not present

## 2017-11-23 DIAGNOSIS — J9621 Acute and chronic respiratory failure with hypoxia: Secondary | ICD-10-CM | POA: Diagnosis not present

## 2017-11-23 DIAGNOSIS — G9341 Metabolic encephalopathy: Secondary | ICD-10-CM | POA: Diagnosis not present

## 2017-11-23 LAB — BASIC METABOLIC PANEL
Anion gap: 10 (ref 5–15)
BUN: 22 mg/dL (ref 8–23)
CALCIUM: 10.2 mg/dL (ref 8.9–10.3)
CHLORIDE: 100 mmol/L (ref 98–111)
CO2: 29 mmol/L (ref 22–32)
Creatinine, Ser: 0.9 mg/dL (ref 0.44–1.00)
GFR calc non Af Amer: >60 mL/min (ref >60)
GLUCOSE: 132 mg/dL — AB (ref 70–99)
Potassium: 3.9 mmol/L (ref 3.5–5.1)
Sodium: 139 mmol/L (ref 135–145)

## 2017-11-23 LAB — MAGNESIUM: Magnesium: 1.6 mg/dL — ABNORMAL LOW (ref 1.7–2.4)

## 2017-11-23 NOTE — Progress Notes (Signed)
Pulmonary Critical Care Medicine [facility] [place]   PULMONARY CRITICAL CARE SERVICE  PROGRESS NOTE  Date of Service: 11/23/2017  Glenda Walker  ZOX:[id number]  DOB: 09-Feb-1956   DOA: 10/31/2017  Referring Physician: Carron Curie, MD  HPI: Glenda Walker is a 62 y.o. female seen for follow up of Acute on Chronic Respiratory Failure.  Patient is doing well today she is actually off of the ventilator now for more than 48 hours since she had the thoracentesis done.  Discussion was made during rounds with the husband regarding possibilities as far as management is concerned I think ultimately if the fluid continues to reaccumulate this will be the limiting factor as far as her being able to wean.  We discussed the possibility of pleurodesis and radiology recommends discussion with cardiothoracic surgery.  In addition the fluid in the abdomen appeared to be scant at this time we will see if this re-accumulates.  Medications: Reviewed on Rounds  Physical Exam:  Vitals: Temperature is 96.5 pulse 111 respiratory rate 19 blood pressure 140/77 saturations 96%  Ventilator Settings off of the ventilator on T collar with 40% oxygen  . General: Comfortable at this time . Eyes: Grossly normal lids, irises & conjunctiva . ENT: grossly tongue is normal . Neck: no obvious mass . Cardiovascular: S1 S2 normal no gallop . Respiratory: No rhonchi or rales are noted . Abdomen: soft . Skin: no rash seen on limited exam . Musculoskeletal: not rigid . Psychiatric:unable to assess . Neurologic: no seizure no involuntary movements         Lab Data:   Basic Metabolic Panel: Recent Labs  Lab 11/18/17 0940 11/20/17 0633 11/21/17 0715 11/23/17 0545  NA 143 140 141 139  K 4.5 4.5 4.2 3.9  CL 106 105 103 100  CO2 30 28 30 29   GLUCOSE 74 100* 103* 132*  BUN 28* 30* 31* 22  CREATININE 0.96 0.94 0.94 0.90  CALCIUM 9.6 9.5 9.7 10.2  MG 2.0 1.8 1.8 1.6*  PHOS 2.6 2.7 2.9  --      ABG: No results for input(s): PHART, PCO2ART, PO2ART, HCO3, O2SAT in the last 168 hours.  Liver Function Tests: Recent Labs  Lab 11/18/17 0940 11/20/17 0633 11/21/17 0715  ALBUMIN 2.1* 2.0* 2.2*   No results for input(s): LIPASE, AMYLASE in the last 168 hours. No results for input(s): AMMONIA in the last 168 hours.  CBC: Recent Labs  Lab 11/18/17 0940 11/20/17 0633 11/21/17 0715  WBC 6.9 5.3 6.3  HGB 10.0* 9.2* 10.0*  HCT 33.5* 31.7* 34.2*  MCV 97.4 98.8 97.7  PLT 192 126* 225    Cardiac Enzymes: No results for input(s): CKTOTAL, CKMB, CKMBINDEX, TROPONINI in the last 168 hours.  BNP (last 3 results) No results for input(s): BNP in the last 8760 hours.  ProBNP (last 3 results) No results for input(s): PROBNP in the last 8760 hours.  Radiological Exams: US Abdomen Limited  Result Date: 11/22/2017 CLINICAL DATA:  62 year old female with a history of recurrent ascites EXAM: LIMITED ABDOMEN ULTRASOUND FOR ASCITES TECHNIQUE: Limited ultrasound survey for ascites was performed in all four abdominal quadrants. COMPARISON:  None. FINDINGS: Ultrasound demonstrates scant abdominal ascites. Paracentesis not performed. Body wall edema IMPRESSION: Scant abdominal ascites. Electronically Signed   By: Gilmer Mor D.O.   On: 11/22/2017 16:33   US Abdomen Limited  Result Date: 11/22/2017 CLINICAL DATA:  Evaluate for ascites. EXAM: LIMITED ABDOMEN ULTRASOUND FOR ASCITES TECHNIQUE: Limited ultrasound survey for ascites was  performed in all four abdominal quadrants. COMPARISON:  CT 11/10/2017 FINDINGS: Small volume abdominopelvic ascites, degree of which appears similar to recent CT. Greatest situs volume in the right abdomen. IMPRESSION: Small volume abdominopelvic ascites. Electronically Signed   By: Narda Rutherford M.D.   On: 11/22/2017 01:09    Assessment/Plan Principal Problem:   Acute on chronic respiratory failure with hypoxia (HCC) Active Problems:   Chronic respiratory  failure with hypoxia (HCC)   Acute on chronic systolic CHF (congestive heart failure) (HCC)   Acute metabolic encephalopathy   Tracheostomy status (HCC)   Chronic atrial fibrillation (HCC)   Pleural effusion, right   1. Acute on chronic respiratory failure with hypoxia continue with the wean on T collar as long as she is able to tolerate.  Continue secretion management pulmonary toilet. 2. Right-sided pleural effusions has had no thoracentesis done 2 times will see if recurrence occurs. 3. Chronic atrial fibrillation rate is controlled we will continue to follow 4. Metabolic encephalopathy she remains grossly unchanged 5. Acute on chronic systolic heart failure diuresis tolerated 6. Tracheostomy we are trying to work towards eventual decannulation and wean   I have personally seen and evaluated the patient, evaluated laboratory and imaging results, formulated the assessment and plan and placed orders. The Patient requires high complexity decision making for assessment and support.  Case was discussed on Rounds with the Respiratory Therapy Staff  Yevonne Pax, MD [facility] Pulmonary Critical Care Medicine Sleep Medicine

## 2017-11-23 NOTE — Progress Notes (Signed)
IR consulted for possible chest tube with pleurodesis.  Case reviewed by Dr. Loreta Ave 9/10 who recommended cardiothoracic evaluation.   No chest tube planned in IR at this time.  Order has been cancelled.   Also note a second order placed for possible paracentesis.  Limited US Abdomen performed yesterday showed on a scant amount of fluid not amenable to drainage.   Order cancelled.   Loyce Dys, MS RD PA-C

## 2017-11-24 ENCOUNTER — Other Ambulatory Visit (HOSPITAL_COMMUNITY): Payer: Self-pay

## 2017-11-24 DIAGNOSIS — I5023 Acute on chronic systolic (congestive) heart failure: Secondary | ICD-10-CM | POA: Diagnosis not present

## 2017-11-24 DIAGNOSIS — G9341 Metabolic encephalopathy: Secondary | ICD-10-CM | POA: Diagnosis not present

## 2017-11-24 DIAGNOSIS — J9621 Acute and chronic respiratory failure with hypoxia: Secondary | ICD-10-CM | POA: Diagnosis not present

## 2017-11-24 DIAGNOSIS — I482 Chronic atrial fibrillation: Secondary | ICD-10-CM | POA: Diagnosis not present

## 2017-11-24 LAB — MAGNESIUM: MAGNESIUM: 1.6 mg/dL — AB (ref 1.7–2.4)

## 2017-11-24 NOTE — Progress Notes (Signed)
Referring Physician(s): Dr. Sharyon Medicus  Supervising Physician: Gilmer Mor  Patient Status:  [facility] - In-pt  Chief Complaint: Recurrent right pleural effusion  Subjective: Patient with recurrent right pleural effusion.  Request now made for PleurX catheter placement-- likely in the setting of CHF and fluid overload.  Case reviewed by Dr. Loreta Ave who has discussed with medical team on [facility] and approves patient for procedure.    Allergies: Patient has no allergy information on record.  Medications: Prior to Admission medications   Not on File    Vital Signs: BP (!) 144/66   Pulse 99   Resp 18   LMP  (LMP Unknown)   SpO2 91%   Physical Exam  NAD, arousable but does not participate in conversation or care Chest/Lungs:  Diminished breath sounds bilateral lower lungs Abdomen: Soft, non-distended MSK: Pitting edema in left hand and arm.   Imaging: US Abdomen Limited  Result Date: 11/22/2017 CLINICAL DATA:  62 year old female with a history of recurrent ascites EXAM: LIMITED ABDOMEN ULTRASOUND FOR ASCITES TECHNIQUE: Limited ultrasound survey for ascites was performed in all four abdominal quadrants. COMPARISON:  None. FINDINGS: Ultrasound demonstrates scant abdominal ascites. Paracentesis not performed. Body wall edema IMPRESSION: Scant abdominal ascites. Electronically Signed   By: Gilmer Mor D.O.   On: 11/22/2017 16:33   US Abdomen Limited  Result Date: 11/22/2017 CLINICAL DATA:  Evaluate for ascites. EXAM: LIMITED ABDOMEN ULTRASOUND FOR ASCITES TECHNIQUE: Limited ultrasound survey for ascites was performed in all four abdominal quadrants. COMPARISON:  CT 11/10/2017 FINDINGS: Small volume abdominopelvic ascites, degree of which appears similar to recent CT. Greatest situs volume in the right abdomen. IMPRESSION: Small volume abdominopelvic ascites. Electronically Signed   By: Narda Rutherford M.D.   On: 11/22/2017 01:09   Dg Chest Port 1 View  Result Date:  11/24/2017 CLINICAL DATA:  Chronic respiratory failure. EXAM: PORTABLE CHEST 1 VIEW COMPARISON:  Multiple prior chest films. The most recent is 11/21/2017 FINDINGS: The tracheostomy tube and NG tubes are stable. Stable surgical changes from bypass surgery. Stable cardiac enlargement. Worsening airspace process, likely worsening pulmonary edema and persistent bilateral pleural effusions. IMPRESSION: Interval slight worsening lung aeration suggesting worsening pulmonary edema and progressive pleural effusions. Electronically Signed   By: Rudie Meyer M.D.   On: 11/24/2017 11:14   Dg Chest Port 1 View  Result Date: 11/21/2017 CLINICAL DATA:  Status post thoracentesis EXAM: PORTABLE CHEST 1 VIEW COMPARISON:  11/20/2017 FINDINGS: Reduction in right-sided pleural effusion is noted following thoracentesis. No pneumothorax is seen. Mild vascular congestion is noted increased from the prior exam. A portion of this may be related to changes from re-expansion. Nasogastric catheter and tracheostomy tube are noted in satisfactory position. Postsurgical changes are again noted. IMPRESSION: No pneumothorax following right thoracentesis. Mild vascular congestion is noted portion which may be related to re-expansion. Electronically Signed   By: Alcide Clever M.D.   On: 11/21/2017 13:11   US Thoracentesis Asp Pleural Space W/img Guide  Result Date: 11/21/2017 INDICATION: Patient with history of dyspnea and recurrent right pleural effusion. Request is made for therapeutic right thoracentesis. EXAM: ULTRASOUND GUIDED THERAPEUTIC RIGHT THORACENTESIS MEDICATIONS: 10 mL of 2% lidocaine COMPLICATIONS: None immediate. PROCEDURE: An ultrasound guided thoracentesis was thoroughly discussed with the patient and questions answered. The benefits, risks, alternatives and complications were also discussed. The patient understands and wishes to proceed with the procedure. Written consent was obtained. Ultrasound was performed to localize and  mark an adequate pocket of fluid in the right chest.  The area was then prepped and draped in the normal sterile fashion. 2% Lidocaine was used for local anesthesia. Under ultrasound guidance a 6 Fr Safe-T-Centesis catheter was introduced. Thoracentesis was performed. The catheter was removed and a dressing applied. FINDINGS: A total of approximately 1.4 L of clear gold fluid was removed. IMPRESSION: Successful ultrasound guided right thoracentesis yielding 1.4 L of pleural fluid. Read by: Elwin MochaAlexandra Louk, PA-C No pneumothorax on follow-up chest radiograph. Electronically Signed   By: Corlis Leak  Hassell M.D.   On: 11/21/2017 13:45    Labs:  CBC: Recent Labs    11/16/17 1123 11/18/17 0940 11/20/17 0633 11/21/17 0715  WBC 7.4 6.9 5.3 6.3  HGB 10.1* 10.0* 9.2* 10.0*  HCT 34.8* 33.5* 31.7* 34.2*  PLT 173 192 126* 225    COAGS: Recent Labs    11/01/17 0521 11/10/17 0944  INR 1.15 1.23    BMP: Recent Labs    11/18/17 0940 11/20/17 0633 11/21/17 0715 11/23/17 0545  NA 143 140 141 139  K 4.5 4.5 4.2 3.9  CL 106 105 103 100  CO2 30 28 30 29   GLUCOSE 74 100* 103* 132*  BUN 28* 30* 31* 22  CALCIUM 9.6 9.5 9.7 10.2  CREATININE 0.96 0.94 0.94 0.90  GFRNONAA >60 >60 >60 >60  GFRAA >60 >60 >60 >60    LIVER FUNCTION TESTS: Recent Labs    11/01/17 0521  11/08/17 0530 11/16/17 1123 11/18/17 0940 11/20/17 0633 11/21/17 0715  BILITOT 1.2  --  1.1  --   --   --   --   AST 20  --  33  --   --   --   --   ALT 15  --  25  --   --   --   --   ALKPHOS 139*  --  302*  --   --   --   --   PROT 5.8*  --  6.3*  --   --   --   --   ALBUMIN 2.2*   < > 2.2* 2.2* 2.1* 2.0* 2.2*   < > = values in this interval not displayed.    Assessment and Plan: Recurrent pleural effusion, right Patient with recurrent right pleural effusion likely related to CHF.  Pleurodesis was originally requested by pulmonology, however case reviewed by Dr. Loreta AveWagner who advises against this due to medical condition and  comorbidities.  PleurX catheter was also discussed.  Although at increased risk for infection and likely chronicity of her effusion, a PleurX is possible.  Patient assessed.  Note edema in left upper arm.  Discussed with care provider who has started lasix.   Repeat CXR this weekend and assess fluid accumulation.  Goal for Pleurx is to assist in weaning from the ventilation to remain on trach collar.  Will need to reach out to family for consent.   Electronically Signed: Hoyt KochKacie Sue-Ellen Matthews, PA 11/24/2017, 2:52 PM   I spent a total of 15 Minutes at the the patient's bedside AND on the patient's hospital floor or unit, greater than 50% of which was counseling/coordinating care for recurrent right pleural effusion.

## 2017-11-24 NOTE — Progress Notes (Signed)
Pulmonary Critical Care Medicine [facility] [place]   PULMONARY CRITICAL CARE SERVICE  PROGRESS NOTE  Date of Service: 11/24/2017  Glenda Walker  ZOX:[id number]  DOB: 1956/01/26   DOA: 10/31/2017  Referring Physician: Carron Curie, MD  HPI: Glenda Walker is a 62 y.o. female seen for follow up of Acute on Chronic Respiratory Failure.  Patient right now is on full support patient was placed back on the ventilator because of weaning failure likely related to underlying fluid reaccumulation  Medications: Reviewed on Rounds  Physical Exam:  Vitals: Temperature 97.1 pulse 100 respiratory rate 17 blood pressure 118/74 saturations 98%  Ventilator Settings mode of ventilation assist control FiO2 35% tidal volume 438 PEEP 5  . General: Comfortable at this time . Eyes: Grossly normal lids, irises & conjunctiva . ENT: grossly tongue is normal . Neck: no obvious mass . Cardiovascular: S1 S2 normal no gallop . Respiratory: No rhonchi or rales are noted . Abdomen: soft . Skin: no rash seen on limited exam . Musculoskeletal: not rigid . Psychiatric:unable to assess . Neurologic: no seizure no involuntary movements         Lab Data:   Basic Metabolic Panel: Recent Labs  Lab 11/18/17 0940 11/20/17 0633 11/21/17 0715 11/23/17 0545 11/24/17 0621  NA 143 140 141 139  --   K 4.5 4.5 4.2 3.9  --   CL 106 105 103 100  --   CO2 30 28 30 29   --   GLUCOSE 74 100* 103* 132*  --   BUN 28* 30* 31* 22  --   CREATININE 0.96 0.94 0.94 0.90  --   CALCIUM 9.6 9.5 9.7 10.2  --   MG 2.0 1.8 1.8 1.6* 1.6*  PHOS 2.6 2.7 2.9  --   --     ABG: No results for input(s): PHART, PCO2ART, PO2ART, HCO3, O2SAT in the last 168 hours.  Liver Function Tests: Recent Labs  Lab 11/18/17 0940 11/20/17 0633 11/21/17 0715  ALBUMIN 2.1* 2.0* 2.2*   No results for input(s): LIPASE, AMYLASE in the last 168 hours. No results for input(s): AMMONIA in the last 168 hours.  CBC: Recent Labs   Lab 11/18/17 0940 11/20/17 0633 11/21/17 0715  WBC 6.9 5.3 6.3  HGB 10.0* 9.2* 10.0*  HCT 33.5* 31.7* 34.2*  MCV 97.4 98.8 97.7  PLT 192 126* 225    Cardiac Enzymes: No results for input(s): CKTOTAL, CKMB, CKMBINDEX, TROPONINI in the last 168 hours.  BNP (last 3 results) No results for input(s): BNP in the last 8760 hours.  ProBNP (last 3 results) No results for input(s): PROBNP in the last 8760 hours.  Radiological Exams: US Abdomen Limited  Result Date: 11/22/2017 CLINICAL DATA:  62 year old female with a history of recurrent ascites EXAM: LIMITED ABDOMEN ULTRASOUND FOR ASCITES TECHNIQUE: Limited ultrasound survey for ascites was performed in all four abdominal quadrants. COMPARISON:  None. FINDINGS: Ultrasound demonstrates scant abdominal ascites. Paracentesis not performed. Body wall edema IMPRESSION: Scant abdominal ascites. Electronically Signed   By: Gilmer Mor D.O.   On: 11/22/2017 16:33   Dg Chest Port 1 View  Result Date: 11/24/2017 CLINICAL DATA:  Chronic respiratory failure. EXAM: PORTABLE CHEST 1 VIEW COMPARISON:  Multiple prior chest films. The most recent is 11/21/2017 FINDINGS: The tracheostomy tube and NG tubes are stable. Stable surgical changes from bypass surgery. Stable cardiac enlargement. Worsening airspace process, likely worsening pulmonary edema and persistent bilateral pleural effusions. IMPRESSION: Interval slight worsening lung aeration suggesting worsening pulmonary  edema and progressive pleural effusions. Electronically Signed   By: Rudie MeyerP.  Gallerani M.D.   On: 11/24/2017 11:14    Assessment/Plan Principal Problem:   Acute on chronic respiratory failure with hypoxia (HCC) Active Problems:   Chronic respiratory failure with hypoxia (HCC)   Acute on chronic systolic CHF (congestive heart failure) (HCC)   Acute metabolic encephalopathy   Tracheostomy status (HCC)   Chronic atrial fibrillation (HCC)   Pleural effusion, right   1. Acute on chronic  respiratory failure with hypoxia we will continue with full vent support right now.  She needs to have the fluid drained again the chest x-ray looked worse and progression of her pleural effusions.  I spoke with primary care team to try to set her up for Pleurx catheter versus pleurodesis 2. Chronic pleural effusions I think she needs permanent solution to drain so that she can eventually come off the ventilator. 3. Chronic atrial fibrillation rate is controlled 4. Tracheostomy not able to wean as noted above 5. Metabolic encephalopathy grossly unchanged 6. Acute on chronic systolic heart failure at baseline we will continue present management   I have personally seen and evaluated the patient, evaluated laboratory and imaging results, formulated the assessment and plan and placed orders. The Patient requires high complexity decision making for assessment and support.  Case was discussed on Rounds with the Respiratory Therapy Staff  Yevonne PaxSaadat A Lawernce Earll, MD [facility] Pulmonary Critical Care Medicine Sleep Medicine

## 2017-11-25 DIAGNOSIS — F330 Major depressive disorder, recurrent, mild: Secondary | ICD-10-CM

## 2017-11-25 LAB — BASIC METABOLIC PANEL
Anion gap: 10 (ref 5–15)
BUN: 23 mg/dL (ref 8–23)
CO2: 26 mmol/L (ref 22–32)
CREATININE: 1.05 mg/dL — AB (ref 0.44–1.00)
Calcium: 9.7 mg/dL (ref 8.9–10.3)
Chloride: 103 mmol/L (ref 98–111)
GFR calc non Af Amer: 56 mL/min — ABNORMAL LOW (ref >60)
Glucose, Bld: 116 mg/dL — ABNORMAL HIGH (ref 70–99)
Potassium: 5.3 mmol/L — ABNORMAL HIGH (ref 3.5–5.1)
Sodium: 139 mmol/L (ref 135–145)

## 2017-11-25 LAB — CBC
HEMATOCRIT: 30.2 % — AB (ref 36.0–46.0)
Hemoglobin: 9 g/dL — ABNORMAL LOW (ref 12.0–15.0)
MCH: 28.4 pg (ref 26.0–34.0)
MCHC: 29.8 g/dL — ABNORMAL LOW (ref 30.0–36.0)
MCV: 95.3 fL (ref 78.0–100.0)
Platelets: 212 10*3/uL (ref 150–400)
RBC: 3.17 MIL/uL — AB (ref 3.87–5.11)
RDW: 20.9 % — ABNORMAL HIGH (ref 11.5–15.5)
WBC: 6.9 10*3/uL (ref 4.0–10.5)

## 2017-11-25 LAB — MAGNESIUM: Magnesium: 1.7 mg/dL (ref 1.7–2.4)

## 2017-11-25 NOTE — Consult Note (Signed)
[facility] Consult   Reason for Consult:  Patient with history of depression on Paroxetine Referring Physician: Dr. Laren Everts Patient Identification: Glenda Walker MRN:  [id number] Principal Diagnosis: Acute on chronic respiratory failure with hypoxia [facility]) Diagnosis:   Patient Active Problem List   Diagnosis Date Noted  . 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 Chronic respiratory failure with hypoxia ([place]) [J96.11] 11/01/2017  . Acute on chronic respiratory failure with hypoxia ([place]) [J96.21] 11/01/2017  . Acute on chronic systolic CHF (congestive heart failure) ([place]) [I50.23] 11/01/2017  . Acute metabolic encephalopathy [N81.77] 11/01/2017  . Tracheostomy status ([place]) [Z93.0] 11/01/2017  . Chronic atrial fibrillation ([place]) [I48.2]   . Pleural effusion, right [J90]     Total Time spent with patient: 1 hour  Subjective:   Glenda Walker is a 62 y.o. female patient admitted with respiratory failure, and altered mental status.  HPI: 62 y.o. female with history  of diabetes type 2, hypothyroidism, hyperlipidemia ,hypertension, coronary artery disease status post CABG,chronic systolic heart failure, stroke,  atrial fibrillation and acute on chronic respiratory failure s/p tracheostomy. Patient requires extensive rehab and [facility]. Patient has difficulty verbalizing but understands every words said to and communicate by nodding her head or writing on a pad. She reports long history of Major depression which was controlled by Paroxetine. Patient reports being fairly stable mentally on current dose of Paroxetine and would not like any changes to her current dose of Paxil. However, she reports occasional sadness due to her current condition but denies delusions, psychosis, suicidal ideations, intent or plan.   Past Psychiatric History: as above  Risk to Self:  denies Risk to  Others:  denies Prior Inpatient Therapy:   Prior Outpatient Therapy:    Past Medical History:  Past Medical History:  Diagnosis Date  . Acute metabolic encephalopathy 03/30/5788  . Acute on chronic respiratory failure with hypoxia ([place]) 11/01/2017  . Acute on chronic systolic CHF (congestive heart failure) ([place]) 11/01/2017  . Chronic atrial fibrillation ([place])   . Chronic respiratory failure with hypoxia ([place]) 11/01/2017  . Coronary artery disease   . Diabetes type 2, uncontrolled ([place])   . Hyperlipidemia   . Hypertension   . Hypothyroidism   . Pleural effusion, right   . Stroke (cerebrum) ([place])   . Tracheostomy status ([place]) 11/01/2017    Past Surgical History:  Procedure Laterality Date  . AORTIC VALVE REPLACEMENT (AVR)/CORONARY ARTERY BYPASS GRAFTING (CABG)    . CORONARY ARTERY BYPASS GRAFT    . TRACHEOSTOMY     Family History:  Family History  Family history unknown: Yes   Family Psychiatric  History:  Social History:  Social History   Substance and Sexual Activity  Alcohol Use Not Currently     Social History   Substance and Sexual Activity  Drug Use Not Currently    Social History   Socioeconomic History  . Marital status: Married    Spouse name: Not on file  . Number of children: Not on file  . Years of education: Not on file  . Highest education level: Not on file  Occupational History  . Not on file  Social Needs  . Financial resource strain: Not on file  . Food insecurity:    Worry: Not on file    Inability: Not on file  . Transportation needs:    Medical: Not on file    Non-medical: Not on file  Tobacco Use  . Smoking status: Unknown If Ever Smoked  . Smokeless tobacco: Never Used  Substance and Sexual Activity  . Alcohol use: Not Currently  . Drug use: Not Currently  . Sexual activity: Not Currently  Lifestyle  . Physical activity:    Days per week: Not on file    Minutes per session: Not on file  . Stress: Not on file  Relationships  .  Social connections:    Talks on phone: Not on file    Gets together: Not on file    Attends religious service: Not on file    Active member of club or organization: Not on file    Attends meetings of clubs or organizations: Not on file    Relationship status: Not on file  Other Topics Concern  . Not on file  Social History Narrative  . Not on file   Additional Social History:    Allergies:  Not on File  Labs:  Results for orders placed or performed during the hospital encounter of 10/31/17 (from the past 48 hour(s))  Magnesium     Status: Abnormal   Collection Time: 11/24/17  6:21 AM  Result Value Ref Range   Magnesium 1.6 (L) 1.7 - 2.4 mg/dL    Comment: Performed at [facility] [address], [place], [place] [address]  Basic metabolic panel     Status: Abnormal   Collection Time: 11/25/17  6:00 AM  Result Value Ref Range   Sodium 139 135 - 145 mmol/L   Potassium 5.3 (H) 3.5 - 5.1 mmol/L   Chloride 103 98 - 111 mmol/L   CO2 26 22 - 32 mmol/L   Glucose, Bld 116 (H) 70 - 99 mg/dL   BUN 23 8 - 23 mg/dL   Creatinine, Ser 1.05 (H) 0.44 - 1.00 mg/dL   Calcium 9.7 8.9 - 10.3 mg/dL   GFR calc non Af Amer 56 (L) >60 mL/min   GFR calc Af Amer >60 >60 mL/min    Comment: (NOTE) The eGFR has been calculated using the CKD EPI equation. This calculation has not been validated in all clinical situations. eGFR's persistently <60 mL/min signify possible Chronic Kidney Disease.    Anion gap 10 5 - 15    Comment: Performed at [facility] [address] [place], [place] [address]  CBC     Status: Abnormal   Collection Time: 11/25/17  8:07 AM  Result Value Ref Range   WBC 6.9 4.0 - 10.5 K/uL   RBC 3.17 (L) 3.87 - 5.11 MIL/uL   Hemoglobin 9.0 (L) 12.0 - 15.0 g/dL   HCT 30.2 (L) 36.0 - 46.0 %   MCV 95.3 78.0 - 100.0 fL   MCH 28.4 26.0 - 34.0 pg   MCHC 29.8 (L) 30.0 - 36.0 g/dL   RDW 20.9 (H) 11.5 - 15.5 %   Platelets 212 150 - 400 K/uL    Comment: Performed at  [facility], [place] [address] [place], [place] [address]  Magnesium     Status: None   Collection Time: 11/25/17  8:07 AM  Result Value Ref Range   Magnesium 1.7 1.7 - 2.4 mg/dL    Comment: Performed at [facility] [address], [place], [place] [address]    Current Facility-Administered Medications  Medication Dose Route Frequency Provider Last Rate Last Dose  . 0.9 %  sodium chloride infusion   Intravenous Continuous PRN Markus Daft, MD 10 mL/hr  at 11/15/17 1441 10 mL/hr at 11/15/17 1441  . ceFAZolin (ANCEF) IVPB 1 g/50 mL premix    Continuous PRN Markus Daft, MD 200 mL/hr at 11/15/17 1458 2 g at 11/15/17 1458  . ceFAZolin (ANCEF) IVPB 2g/100 mL premix  2 g Intravenous Q8H Markus Daft, MD        Musculoskeletal: Strength & Muscle Tone: not tested Gait & Station: unable to stand Patient leans: N/A  Psychiatric Specialty Exam: Physical Exam  Psychiatric: She has a normal mood and affect. Judgment and thought content normal. She is slowed and withdrawn. Cognition and memory are normal. She is noncommunicative.    Review of Systems  Psychiatric/Behavioral: Positive for depression.    Blood pressure (!) 144/66, pulse 99, resp. rate 18, SpO2 91 %.There is no height or weight on file to calculate BMI.  General Appearance: Casual  Eye Contact:  Good  Speech:  non-communicative  Volume:  N/A  Mood:  Dysphoric  Affect:  Constricted  Thought Process:  NA  Orientation:  Full (Time, Place, and Person)  Thought Content:  Logical  Suicidal Thoughts:  No  Homicidal Thoughts:  No  Memory:  Immediate;   Fair Recent;   Fair Remote;   Fair  Judgement:  Fair  Insight:  Fair  Psychomotor Activity:  Psychomotor Retardation  Concentration:  Concentration: Fair and Attention Span: Fair  Recall:  [employer] of Knowledge:  Fair  Language:  NONE  Akathisia:  No  Handed:  AIMS (if indicated):     Assets:  Desire for Improvement Social Support Others:  family support  ADL's:   Impaired  Cognition:  WNL  Sleep:    fair     Treatment Plan Summary: Diagnosis: Major depressive disorder recurrent episode mild -Collateral information obtained from patient's family -Continue Paroxetine 40 mg daily to address depression.   Disposition: No evidence of imminent risk to self or others at present.   Supportive therapy provided about ongoing stressors. F/U in 2-4 weeks  Corena Pilgrim, MD 11/25/2017 5:01 PM

## 2017-11-26 LAB — MAGNESIUM: Magnesium: 1.8 mg/dL (ref 1.7–2.4)

## 2017-11-26 LAB — POTASSIUM: POTASSIUM: 3.4 mmol/L — AB (ref 3.5–5.1)

## 2017-11-27 ENCOUNTER — Other Ambulatory Visit (HOSPITAL_COMMUNITY): Payer: Self-pay

## 2017-11-27 LAB — COMPREHENSIVE METABOLIC PANEL
ALBUMIN: 1.9 g/dL — AB (ref 3.5–5.0)
ALT: 18 U/L (ref 0–44)
AST: 24 U/L (ref 15–41)
Alkaline Phosphatase: 181 U/L — ABNORMAL HIGH (ref 38–126)
Anion gap: 8 (ref 5–15)
BUN: 23 mg/dL (ref 8–23)
CO2: 34 mmol/L — AB (ref 22–32)
CREATININE: 1 mg/dL (ref 0.44–1.00)
Calcium: 9.1 mg/dL (ref 8.9–10.3)
Chloride: 96 mmol/L — ABNORMAL LOW (ref 98–111)
GFR calc non Af Amer: 60 mL/min — ABNORMAL LOW (ref >60)
Glucose, Bld: 96 mg/dL (ref 70–99)
Potassium: 3.5 mmol/L (ref 3.5–5.1)
SODIUM: 138 mmol/L (ref 135–145)
Total Bilirubin: 0.8 mg/dL (ref 0.3–1.2)
Total Protein: 5.2 g/dL — ABNORMAL LOW (ref 6.5–8.1)

## 2017-11-28 ENCOUNTER — Other Ambulatory Visit (HOSPITAL_COMMUNITY): Payer: Self-pay

## 2017-11-28 DIAGNOSIS — I482 Chronic atrial fibrillation: Secondary | ICD-10-CM | POA: Diagnosis not present

## 2017-11-28 DIAGNOSIS — J9621 Acute and chronic respiratory failure with hypoxia: Secondary | ICD-10-CM | POA: Diagnosis not present

## 2017-11-28 DIAGNOSIS — G9341 Metabolic encephalopathy: Secondary | ICD-10-CM | POA: Diagnosis not present

## 2017-11-28 DIAGNOSIS — I5023 Acute on chronic systolic (congestive) heart failure: Secondary | ICD-10-CM | POA: Diagnosis not present

## 2017-11-28 LAB — BASIC METABOLIC PANEL
Anion gap: 12 (ref 5–15)
BUN: 24 mg/dL — ABNORMAL HIGH (ref 8–23)
CALCIUM: 9.5 mg/dL (ref 8.9–10.3)
CO2: 30 mmol/L (ref 22–32)
CREATININE: 1.08 mg/dL — AB (ref 0.44–1.00)
Chloride: 95 mmol/L — ABNORMAL LOW (ref 98–111)
GFR calc non Af Amer: 54 mL/min — ABNORMAL LOW (ref >60)
Glucose, Bld: 87 mg/dL (ref 70–99)
Potassium: 3.7 mmol/L (ref 3.5–5.1)
SODIUM: 137 mmol/L (ref 135–145)

## 2017-11-28 LAB — CBC
HCT: 31.3 % — ABNORMAL LOW (ref 36.0–46.0)
Hemoglobin: 9.3 g/dL — ABNORMAL LOW (ref 12.0–15.0)
MCH: 28.7 pg (ref 26.0–34.0)
MCHC: 29.7 g/dL — AB (ref 30.0–36.0)
MCV: 96.6 fL (ref 78.0–100.0)
Platelets: 235 10*3/uL (ref 150–400)
RBC: 3.24 MIL/uL — ABNORMAL LOW (ref 3.87–5.11)
RDW: 20.7 % — AB (ref 11.5–15.5)
WBC: 4.4 10*3/uL (ref 4.0–10.5)

## 2017-11-28 LAB — MAGNESIUM: MAGNESIUM: 1.7 mg/dL (ref 1.7–2.4)

## 2017-11-28 NOTE — Progress Notes (Signed)
Pulmonary Critical Care Medicine [facility]   PULMONARY CRITICAL CARE SERVICE  PROGRESS NOTE  Date of Service: 11/28/2017  Glenda Bunnellatricia A Melman  JWJ:[id number]  DOB: 04/17/1955   DOA: 10/31/2017  Referring Physician: Carron CurieAli Hijazi, MD  HPI: Glenda Walker is a 62 y.o. female seen for follow up of Acute on Chronic Respiratory Failure.  She is back on the ventilator she is been failing pressure support attempts.  Fluid has been her main issue.  Right now is on assist control mode  Medications: Reviewed on Rounds  Physical Exam:  Vitals: Temperature 96.9 pulse 95 respiratory rate 18 blood pressure 138/90 saturations 100%  Ventilator Settings mode of ventilation assist control FiO2 35% tidal volume 350 PEEP 5  . General: Comfortable at this time . Eyes: Grossly normal lids, irises & conjunctiva . ENT: grossly tongue is normal . Neck: no obvious mass . Cardiovascular: S1 S2 normal no gallop . Respiratory: Coarse breath sounds some rhonchi noted still . Abdomen: soft . Skin: no rash seen on limited exam . Musculoskeletal: not rigid . Psychiatric:unable to assess . Neurologic: no seizure no involuntary movements         Lab Data:   Basic Metabolic Panel: Recent Labs  Lab 11/23/17 0545 11/24/17 0621 11/25/17 0600 11/25/17 0807 11/26/17 1143 11/27/17 0724 11/28/17 0654  NA 139  --  139  --   --  138 137  K 3.9  --  5.3*  --  3.4* 3.5 3.7  CL 100  --  103  --   --  96* 95*  CO2 29  --  26  --   --  34* 30  GLUCOSE 132*  --  116*  --   --  96 87  BUN 22  --  23  --   --  23 24*  CREATININE 0.90  --  1.05*  --   --  1.00 1.08*  CALCIUM 10.2  --  9.7  --   --  9.1 9.5  MG 1.6* 1.6*  --  1.7 1.8  --  1.7    ABG: No results for input(s): PHART, PCO2ART, PO2ART, HCO3, O2SAT in the last 168 hours.  Liver Function Tests: Recent Labs  Lab 11/27/17 0724  AST 24  ALT 18  ALKPHOS 181*  BILITOT 0.8  PROT 5.2*  ALBUMIN 1.9*   No results for input(s):  LIPASE, AMYLASE in the last 168 hours. No results for input(s): AMMONIA in the last 168 hours.  CBC: Recent Labs  Lab 11/25/17 0807 11/28/17 0654  WBC 6.9 4.4  HGB 9.0* 9.3*  HCT 30.2* 31.3*  MCV 95.3 96.6  PLT 212 235    Cardiac Enzymes: No results for input(s): CKTOTAL, CKMB, CKMBINDEX, TROPONINI in the last 168 hours.  BNP (last 3 results) No results for input(s): BNP in the last 8760 hours.  ProBNP (last 3 results) No results for input(s): PROBNP in the last 8760 hours.  Radiological Exams: Dg Chest Port 1 View  Result Date: 11/28/2017 CLINICAL DATA:  Chronic respiratory failure EXAM: PORTABLE CHEST 1 VIEW COMPARISON:  November 27, 2017 FINDINGS: Tracheostomy catheter tip is 3.7 cm above the carina. Nasogastric tube tip and side port below the diaphragm. No pneumothorax. There is generalized interstitial edema with pleural effusions bilaterally. There is consolidation in the left lower lobe. There is cardiomegaly with pulmonary venous hypertension. Patient is status post coronary artery bypass grafting. No pneumothorax. No adenopathy. No bone lesions. IMPRESSION: Pulmonary vascular congestion with  bilateral pleural effusions and interstitial edema. Consolidation left lower lobe. Question alveolar edema versus pneumonia. Both entities may be present concurrently. Appearance similar to 1 day prior. Electronically Signed   By: Bretta Bang III M.D.   On: 11/28/2017 07:29   Dg Chest Port 1 View  Result Date: 11/27/2017 CLINICAL DATA:  Evaluate pleural effusion EXAM: PORTABLE CHEST 1 VIEW COMPARISON:  11/24/2017 FINDINGS: Cardiac shadow remains enlarged. Postsurgical changes are again noted. Tracheostomy tube is again seen as is a nasogastric catheter. Persistent right pleural effusion is noted. The vascular congestion seen previously has improved somewhat. Patchy infiltrates are again identified throughout the right lung. Small left pleural effusion remains. IMPRESSION: Slight  improvement in vascular congestion. Patchy infiltrates are noted throughout the right lung. Bilateral effusions right greater than left are noted. Electronically Signed   By: Alcide Clever M.D.   On: 11/27/2017 15:28    Assessment/Plan Principal Problem:   Acute on chronic respiratory failure with hypoxia (HCC) Active Problems:   Chronic respiratory failure with hypoxia (HCC)   Acute on chronic systolic CHF (congestive heart failure) (HCC)   Acute metabolic encephalopathy   Tracheostomy status (HCC)   Chronic atrial fibrillation (HCC)   Pleural effusion, right   Major depressive disorder, recurrent episode, mild (HCC)   1. Acute on chronic respiratory failure with hypoxia chest x-ray shows patchy infiltrates and vascular congestion's and the pleural effusions are of course back again right greater than left 2. Chronic atrial fibrillation right now the rate is controlled we will continue with present management. 3. Right-sided pleural effusion recurrent problem she needs to have a Pleurx catheter as previously discussed this is being worked on. 4. Metabolic encephalopathy she is grossly unchanged. 5. Acute on chronic systolic heart failure we will continue with present management diuresis tolerated   I have personally seen and evaluated the patient, evaluated laboratory and imaging results, formulated the assessment and plan and placed orders. The Patient requires high complexity decision making for assessment and support.  Case was discussed on Rounds with the Respiratory Therapy Staff  Yevonne Pax, MD [facility] Pulmonary Critical Care Medicine Sleep Medicine

## 2017-11-29 ENCOUNTER — Encounter (HOSPITAL_COMMUNITY): Payer: Self-pay | Admitting: Interventional Radiology

## 2017-11-29 ENCOUNTER — Other Ambulatory Visit (HOSPITAL_COMMUNITY): Payer: Self-pay

## 2017-11-29 DIAGNOSIS — J9621 Acute and chronic respiratory failure with hypoxia: Secondary | ICD-10-CM | POA: Diagnosis not present

## 2017-11-29 DIAGNOSIS — I5023 Acute on chronic systolic (congestive) heart failure: Secondary | ICD-10-CM | POA: Diagnosis not present

## 2017-11-29 DIAGNOSIS — I482 Chronic atrial fibrillation: Secondary | ICD-10-CM | POA: Diagnosis not present

## 2017-11-29 DIAGNOSIS — G9341 Metabolic encephalopathy: Secondary | ICD-10-CM | POA: Diagnosis not present

## 2017-11-29 HISTORY — PX: IR PERC TUN PERIT CATH WO PORT S&I /IMAG: IMG2327

## 2017-11-29 LAB — MAGNESIUM: Magnesium: 1.9 mg/dL (ref 1.7–2.4)

## 2017-11-29 LAB — RENAL FUNCTION PANEL
ALBUMIN: 2.1 g/dL — AB (ref 3.5–5.0)
Anion gap: 8 (ref 5–15)
BUN: 20 mg/dL (ref 8–23)
CALCIUM: 9.5 mg/dL (ref 8.9–10.3)
CO2: 33 mmol/L — AB (ref 22–32)
Chloride: 95 mmol/L — ABNORMAL LOW (ref 98–111)
Creatinine, Ser: 1.08 mg/dL — ABNORMAL HIGH (ref 0.44–1.00)
GFR calc Af Amer: >60 mL/min (ref >60)
GFR calc non Af Amer: 54 mL/min — ABNORMAL LOW (ref >60)
Glucose, Bld: 98 mg/dL (ref 70–99)
PHOSPHORUS: 3 mg/dL (ref 2.5–4.6)
Potassium: 3.5 mmol/L (ref 3.5–5.1)
SODIUM: 136 mmol/L (ref 135–145)

## 2017-11-29 MED ORDER — CEFAZOLIN SODIUM-DEXTROSE 2-4 GM/100ML-% IV SOLN
INTRAVENOUS | Status: AC
  Filled 2017-11-29: qty 100

## 2017-11-29 MED ORDER — MIDAZOLAM HCL 2 MG/2ML IJ SOLN
INTRAMUSCULAR | Status: AC
  Filled 2017-11-29: qty 4

## 2017-11-29 MED ORDER — FENTANYL CITRATE (PF) 100 MCG/2ML IJ SOLN
INTRAMUSCULAR | Status: AC | PRN
  Administered 2017-11-29: 50 ug via INTRAVENOUS

## 2017-11-29 MED ORDER — FENTANYL CITRATE (PF) 100 MCG/2ML IJ SOLN
INTRAMUSCULAR | Status: AC
  Filled 2017-11-29: qty 4

## 2017-11-29 MED ORDER — LIDOCAINE HCL 1 % IJ SOLN
INTRAMUSCULAR | Status: AC
  Filled 2017-11-29: qty 20

## 2017-11-29 MED ORDER — LIDOCAINE HCL (PF) 1 % IJ SOLN
INTRAMUSCULAR | Status: AC | PRN
  Administered 2017-11-29: 15 mL

## 2017-11-29 MED ORDER — MIDAZOLAM HCL 2 MG/2ML IJ SOLN
INTRAMUSCULAR | Status: AC | PRN
  Administered 2017-11-29: 1 mg via INTRAVENOUS

## 2017-11-29 MED ORDER — CEFAZOLIN SODIUM-DEXTROSE 1-4 GM/50ML-% IV SOLN
1.0000 g | Freq: Once | INTRAVENOUS | Status: AC
  Administered 2017-11-29: 1 g via INTRAVENOUS

## 2017-11-29 NOTE — Progress Notes (Signed)
Pulmonary Critical Care Medicine [facility]   PULMONARY CRITICAL CARE SERVICE  PROGRESS NOTE  Date of Service: 11/29/2017  Glenda Walker  ZOX:[id number]  DOB: Nov 29, 1955   DOA: 10/31/2017  Referring Physician: Carron Curie, MD  HPI: Glenda Walker is a 62 y.o. female seen for follow up of Acute on Chronic Respiratory Failure.  She is resting on the ventilator on full support.  She is supposed to have a Pleurx catheter placed today  Medications: Reviewed on Rounds  Physical Exam:  Vitals: Temperature 97.4 pulse 85 respiratory rate 13 blood pressure 141/85 saturations 100%  Ventilator Settings mode of ventilation assist control FiO2 35% tidal volume 400 PEEP 5  . General: Comfortable at this time . Eyes: Grossly normal lids, irises & conjunctiva . ENT: grossly tongue is normal . Neck: no obvious mass . Cardiovascular: S1 S2 normal no gallop . Respiratory: No rhonchi or rales are noted at this time . Abdomen: soft . Skin: no rash seen on limited exam . Musculoskeletal: not rigid . Psychiatric:unable to assess . Neurologic: no seizure no involuntary movements         Lab Data:   Basic Metabolic Panel: Recent Labs  Lab 11/23/17 0545 11/24/17 0621 11/25/17 0600 11/25/17 0807 11/26/17 1143 11/27/17 0724 11/28/17 0654 11/29/17 0637  NA 139  --  139  --   --  138 137 136  K 3.9  --  5.3*  --  3.4* 3.5 3.7 3.5  CL 100  --  103  --   --  96* 95* 95*  CO2 29  --  26  --   --  34* 30 33*  GLUCOSE 132*  --  116*  --   --  96 87 98  BUN 22  --  23  --   --  23 24* 20  CREATININE 0.90  --  1.05*  --   --  1.00 1.08* 1.08*  CALCIUM 10.2  --  9.7  --   --  9.1 9.5 9.5  MG 1.6* 1.6*  --  1.7 1.8  --  1.7 1.9  PHOS  --   --   --   --   --   --   --  3.0    ABG: No results for input(s): PHART, PCO2ART, PO2ART, HCO3, O2SAT in the last 168 hours.  Liver Function Tests: Recent Labs  Lab 11/27/17 0724 11/29/17 0637  AST 24  --   ALT 18  --    ALKPHOS 181*  --   BILITOT 0.8  --   PROT 5.2*  --   ALBUMIN 1.9* 2.1*   No results for input(s): LIPASE, AMYLASE in the last 168 hours. No results for input(s): AMMONIA in the last 168 hours.  CBC: Recent Labs  Lab 11/25/17 0807 11/28/17 0654  WBC 6.9 4.4  HGB 9.0* 9.3*  HCT 30.2* 31.3*  MCV 95.3 96.6  PLT 212 235    Cardiac Enzymes: No results for input(s): CKTOTAL, CKMB, CKMBINDEX, TROPONINI in the last 168 hours.  BNP (last 3 results) No results for input(s): BNP in the last 8760 hours.  ProBNP (last 3 results) No results for input(s): PROBNP in the last 8760 hours.  Radiological Exams: Dg Chest Port 1 View  Result Date: 11/28/2017 CLINICAL DATA:  Chronic respiratory failure EXAM: PORTABLE CHEST 1 VIEW COMPARISON:  November 27, 2017 FINDINGS: Tracheostomy catheter tip is 3.7 cm above the carina. Nasogastric tube tip and side port below the  diaphragm. No pneumothorax. There is generalized interstitial edema with pleural effusions bilaterally. There is consolidation in the left lower lobe. There is cardiomegaly with pulmonary venous hypertension. Patient is status post coronary artery bypass grafting. No pneumothorax. No adenopathy. No bone lesions. IMPRESSION: Pulmonary vascular congestion with bilateral pleural effusions and interstitial edema. Consolidation left lower lobe. Question alveolar edema versus pneumonia. Both entities may be present concurrently. Appearance similar to 1 day prior. Electronically Signed   By: Bretta BangWilliam  Woodruff III M.D.   On: 11/28/2017 07:29   Dg Chest Port 1 View  Result Date: 11/27/2017 CLINICAL DATA:  Evaluate pleural effusion EXAM: PORTABLE CHEST 1 VIEW COMPARISON:  11/24/2017 FINDINGS: Cardiac shadow remains enlarged. Postsurgical changes are again noted. Tracheostomy tube is again seen as is a nasogastric catheter. Persistent right pleural effusion is noted. The vascular congestion seen previously has improved somewhat. Patchy infiltrates  are again identified throughout the right lung. Small left pleural effusion remains. IMPRESSION: Slight improvement in vascular congestion. Patchy infiltrates are noted throughout the right lung. Bilateral effusions right greater than left are noted. Electronically Signed   By: Alcide CleverMark  Lukens M.D.   On: 11/27/2017 15:28    Assessment/Plan Principal Problem:   Acute on chronic respiratory failure with hypoxia (HCC) Active Problems:   Chronic respiratory failure with hypoxia (HCC)   Acute on chronic systolic CHF (congestive heart failure) (HCC)   Acute metabolic encephalopathy   Tracheostomy status (HCC)   Chronic atrial fibrillation (HCC)   Pleural effusion, right   Major depressive disorder, recurrent episode, mild (HCC)   1. Acute on chronic respiratory failure with hypoxia continue with full support on assist control mode patient remains on 35% titrate oxygen as tolerated.  Hopefully once she has the catheter placed we might be able to then wean her again 2. Acute on chronic systolic heart failure monitor fluid status 3. Metabolic encephalopathy grossly unchanged she is more awake today 4. Chronic atrial fibrillation rate is controlled 5. Right pleural effusion recurring issue now to get a Pleurx catheter   I have personally seen and evaluated the patient, evaluated laboratory and imaging results, formulated the assessment and plan and placed orders. The Patient requires high complexity decision making for assessment and support.  Case was discussed on Rounds with the Respiratory Therapy Staff  Yevonne PaxSaadat A Domenique Quest, MD [facility] Pulmonary Critical Care Medicine Sleep Medicine

## 2017-11-29 NOTE — Procedures (Signed)
Interventional Radiology Procedure Note  Procedure: Right pleural drainage catheter, tunneled, for recurrent pleural fluid secondary to CHF. ~50% likelihood of mechanical pleurodesis.  .  Complications: None Recommendations:  - Ok to use - Do not submerge for 7 days - Routine wound care   Signed,  Yvone NeuJaime S. Loreta AveWagner, DO

## 2017-11-30 ENCOUNTER — Other Ambulatory Visit (HOSPITAL_COMMUNITY): Payer: Self-pay

## 2017-11-30 DIAGNOSIS — G9341 Metabolic encephalopathy: Secondary | ICD-10-CM | POA: Diagnosis not present

## 2017-11-30 DIAGNOSIS — J9621 Acute and chronic respiratory failure with hypoxia: Secondary | ICD-10-CM | POA: Diagnosis not present

## 2017-11-30 DIAGNOSIS — I5023 Acute on chronic systolic (congestive) heart failure: Secondary | ICD-10-CM | POA: Diagnosis not present

## 2017-11-30 DIAGNOSIS — I482 Chronic atrial fibrillation: Secondary | ICD-10-CM | POA: Diagnosis not present

## 2017-11-30 NOTE — Progress Notes (Signed)
Pulmonary Critical Care Medicine [facility] [place]   PULMONARY CRITICAL CARE SERVICE  PROGRESS NOTE  Date of Service: 11/30/2017  Glenda Walker  ZOX:[id number]  DOB: [birth year]   DOA: 10/31/2017  Referring Physician: Carron Curie, MD  HPI: Glenda Walker is a 62 y.o. female seen for follow up of Acute on Chronic Respiratory Failure.  Currently is on assist control mode has been on 28% FiO2 she had a Pleurx catheter placed yesterday drained over 1 L fluid already  Medications: Reviewed on Rounds  Physical Exam:  Vitals: Temperature 96.2 pulse 84 respiratory rate 16 blood pressure 124/70 saturations 100%  Ventilator Settings mode of ventilation assist control FiO2 28% tidal volume 362 PEEP 5  . General: Comfortable at this time . Eyes: Grossly normal lids, irises & conjunctiva . ENT: grossly tongue is normal . Neck: no obvious mass . Cardiovascular: S1 S2 normal no gallop . Respiratory: No rhonchi or rales noted . Abdomen: soft . Skin: no rash seen on limited exam . Musculoskeletal: not rigid . Psychiatric:unable to assess . Neurologic: no seizure no involuntary movements         Lab Data:   Basic Metabolic Panel: Recent Labs  Lab 11/24/17 0621 11/25/17 0600 11/25/17 0807 11/26/17 1143 11/27/17 0724 11/28/17 0654 11/29/17 0637  NA  --  139  --   --  138 137 136  K  --  5.3*  --  3.4* 3.5 3.7 3.5  CL  --  103  --   --  96* 95* 95*  CO2  --  26  --   --  34* 30 33*  GLUCOSE  --  116*  --   --  96 87 98  BUN  --  23  --   --  23 24* 20  CREATININE  --  1.05*  --   --  1.00 1.08* 1.08*  CALCIUM  --  9.7  --   --  9.1 9.5 9.5  MG 1.6*  --  1.7 1.8  --  1.7 1.9  PHOS  --   --   --   --   --   --  3.0    ABG: No results for input(s): PHART, PCO2ART, PO2ART, HCO3, O2SAT in the last 168 hours.  Liver Function Tests: Recent Labs  Lab 11/27/17 0724 11/29/17 0637  AST 24  --   ALT 18  --   ALKPHOS 181*  --   BILITOT 0.8  --   PROT 5.2*  --    ALBUMIN 1.9* 2.1*   No results for input(s): LIPASE, AMYLASE in the last 168 hours. No results for input(s): AMMONIA in the last 168 hours.  CBC: Recent Labs  Lab 11/25/17 0807 11/28/17 0654  WBC 6.9 4.4  HGB 9.0* 9.3*  HCT 30.2* 31.3*  MCV 95.3 96.6  PLT 212 235    Cardiac Enzymes: No results for input(s): CKTOTAL, CKMB, CKMBINDEX, TROPONINI in the last 168 hours.  BNP (last 3 results) No results for input(s): BNP in the last 8760 hours.  ProBNP (last 3 results) No results for input(s): PROBNP in the last 8760 hours.  Radiological Exams: Ir Konrad Penta Perit Cath [facility]  Result Date: 11/29/2017 INDICATION: 62 year old female with history of recurrent right-sided pleural effusion secondary to congestive heart failure. She presents today for tunneled pleural catheter placement, and possible related mechanical pleurodesis. EXAM: IMAGE GUIDED RIGHT PLEURAL TUNNELED DRAINAGE CATHETER MEDICATIONS: 2 G ANCEF the antibiotics were administered within an  appropriate time frame prior to the initiation of the procedure. ANESTHESIA/SEDATION: Fentanyl 50 mcg IV; Versed 1.0 mg IV Moderate Sedation Time:  13 minutes The patient was continuously monitored during the procedure by the interventional radiology nurse under my direct supervision. COMPLICATIONS: None PROCEDURE: The procedure, risks, benefits, and alternatives were explained to the patient and the patient's family. Specific risks that were addressed included bleeding, infection, pneumothorax, need for further procedure, chance of delayed pneumothorax or hemorrhage, hemoptysis, cardiopulmonary collapse, death. Questions regarding the procedure were encouraged and answered. The patient understands and consents to the procedure. The right chest wall was prepped with Betadine in a sterile fashion, and a sterile drape was applied covering the operative field. A sterile gown and sterile gloves were used for the procedure. Local anesthesia was  provided with 1% Lidocaine. Ultrasound image documentation was performed. After creating a small skin incision, a 19 gauge needle was advanced into the pleural cavity under ultrasound guidance. A guide wire was then advanced under fluoroscopy into the pleural space. Pleural access was dilated serially and a 16-French peel-away sheath placed. The skin and subcutaneous tissues were generously infiltrated with 1% lidocaine from the puncture site over the pleura along the intercostal margin anteriorly. A small stab incision was made with 11 blade scalpel at the insertion site of the catheter, and the catheter was back tunneled to the site at the pleural puncture. A tunneled pleural, cuffed drainage catheter was placed. This was tunneled from the incision 5 cm anterior to the pleural access to the access site. The catheter was advanced through the peel-away sheath. The sheath was then removed. Final catheter positioning was confirmed with a fluoroscopic spot image. The access incision was closed with derma bond. Dermabond was applied to the catheterization incision. Large volume thoracentesis was performed through the new catheter utilizing gravity drainage bag. The patient tolerated the procedure well and remained hemodynamically stable throughout. No complications were encountered and no significant blood loss was encountered. IMPRESSION: Status post right tunneled pleural catheter placement. Catheter ready for use. Signed, Yvone Neu. Reyne Dumas, RPVI [facility] Radiology Electronically Signed   By: Gilmer Mor D.O.   On: 11/29/2017 11:36   Dg Chest Port 1 View  Result Date: 11/30/2017 CLINICAL DATA:  Follow-up pleural effusion EXAM: PORTABLE CHEST 1 VIEW COMPARISON:  11/28/2017 FINDINGS: Cardiac shadow remains enlarged. Postsurgical changes are noted. Tracheostomy tube and nasogastric catheter are noted in satisfactory position. New right PleurX catheter is noted  in satisfactory position. Interval reduction in right-sided pleural effusion is seen. No pneumothorax is noted. Bibasilar atelectatic changes are seen. IMPRESSION: Reduction in right-sided pleural effusion following PleurX catheter placement. Bibasilar atelectatic changes are seen. Electronically Signed   By: Alcide Clever M.D.   On: 11/30/2017 10:41    Assessment/Plan Principal Problem:   Acute on chronic respiratory failure with hypoxia (HCC) Active Problems:   Chronic respiratory failure with hypoxia (HCC)   Acute on chronic systolic CHF (congestive heart failure) (HCC)   Acute metabolic encephalopathy   Tracheostomy status (HCC)   Chronic atrial fibrillation (HCC)   Pleural effusion, right   Major depressive disorder, recurrent episode, mild (HCC)   1. Acute on chronic respiratory failure with hypoxia patient will be reassessed for spontaneous breathing trial continue pulmonary toilet secretion management. 2. Chronic right pleural effusion status post Pleurx catheter for drainage hopefully this will help with the weaning process but I still think that patient may end up having to have pleurodesis needed.  3. Acute on chronic systolic heart failure we will continue with present management. 4. Chronic atrial fibrillation rate is controlled   I have personally seen and evaluated the patient, evaluated laboratory and imaging results, formulated the assessment and plan and placed orders. The Patient requires high complexity decision making for assessment and support.  Case was discussed on Rounds with the Respiratory Therapy Staff  Yevonne PaxSaadat A Samanvitha Germany, MD [facility] Pulmonary Critical Care Medicine Sleep Medicine

## 2017-12-01 ENCOUNTER — Other Ambulatory Visit (HOSPITAL_COMMUNITY): Payer: Self-pay

## 2017-12-01 DIAGNOSIS — G9341 Metabolic encephalopathy: Secondary | ICD-10-CM | POA: Diagnosis not present

## 2017-12-01 DIAGNOSIS — I5023 Acute on chronic systolic (congestive) heart failure: Secondary | ICD-10-CM | POA: Diagnosis not present

## 2017-12-01 DIAGNOSIS — I482 Chronic atrial fibrillation: Secondary | ICD-10-CM | POA: Diagnosis not present

## 2017-12-01 DIAGNOSIS — J9621 Acute and chronic respiratory failure with hypoxia: Secondary | ICD-10-CM | POA: Diagnosis not present

## 2017-12-01 LAB — RENAL FUNCTION PANEL
Albumin: 2.1 g/dL — ABNORMAL LOW (ref 3.5–5.0)
Anion gap: 13 (ref 5–15)
BUN: 21 mg/dL (ref 8–23)
CHLORIDE: 93 mmol/L — AB (ref 98–111)
CO2: 29 mmol/L (ref 22–32)
Calcium: 9.7 mg/dL (ref 8.9–10.3)
Creatinine, Ser: 1.08 mg/dL — ABNORMAL HIGH (ref 0.44–1.00)
GFR calc Af Amer: >60 mL/min (ref >60)
GFR calc non Af Amer: 54 mL/min — ABNORMAL LOW (ref >60)
GLUCOSE: 91 mg/dL (ref 70–99)
POTASSIUM: 3.5 mmol/L (ref 3.5–5.1)
Phosphorus: 2.2 mg/dL — ABNORMAL LOW (ref 2.5–4.6)
Sodium: 135 mmol/L (ref 135–145)

## 2017-12-01 LAB — CBC
HCT: 27.9 % — ABNORMAL LOW (ref 36.0–46.0)
Hemoglobin: 8.7 g/dL — ABNORMAL LOW (ref 12.0–15.0)
MCH: 28.5 pg (ref 26.0–34.0)
MCHC: 31.2 g/dL (ref 30.0–36.0)
MCV: 91.5 fL (ref 78.0–100.0)
PLATELETS: 244 10*3/uL (ref 150–400)
RBC: 3.05 MIL/uL — ABNORMAL LOW (ref 3.87–5.11)
RDW: 19.6 % — AB (ref 11.5–15.5)
WBC: 4.5 10*3/uL (ref 4.0–10.5)

## 2017-12-01 LAB — MAGNESIUM: Magnesium: 1.8 mg/dL (ref 1.7–2.4)

## 2017-12-01 NOTE — Progress Notes (Signed)
Pulmonary Critical Care Medicine [facility] [place]   PULMONARY CRITICAL CARE SERVICE  PROGRESS NOTE  Date of Service: 12/01/2017  Glenda Walker  ZOX:[id number]  DOB: 01-28-1956   DOA: 10/31/2017  Referring Physician: Carron Curie, MD  HPI: Glenda Walker is a 62 y.o. female seen for follow up of Acute on Chronic Respiratory Failure.  She was weaning on pressure support total the goal is for 8 hours patient is on 28% oxygen  Medications: Reviewed on Rounds  Physical Exam:  Vitals: Temperature 98.0 pulse 85 respiratory 25 blood pressure 129/76 saturations 100%  Ventilator Settings mode of ventilation pressure support FiO2 28% tidal volume 530 for pressure support 12 PEEP 5  . General: Comfortable at this time . Eyes: Grossly normal lids, irises & conjunctiva . ENT: grossly tongue is normal . Neck: no obvious mass . Cardiovascular: S1 S2 normal no gallop . Respiratory: No rhonchi or rales are noted at this time . Abdomen: soft . Skin: no rash seen on limited exam . Musculoskeletal: not rigid . Psychiatric:unable to assess . Neurologic: no seizure no involuntary movements         Lab Data:   Basic Metabolic Panel: Recent Labs  Lab 11/25/17 0600 11/25/17 0807 11/26/17 1143 11/27/17 0724 11/28/17 0654 11/29/17 0637 12/01/17 0602  NA 139  --   --  138 137 136 135  K 5.3*  --  3.4* 3.5 3.7 3.5 3.5  CL 103  --   --  96* 95* 95* 93*  CO2 26  --   --  34* 30 33* 29  GLUCOSE 116*  --   --  96 87 98 91  BUN 23  --   --  23 24* 20 21  CREATININE 1.05*  --   --  1.00 1.08* 1.08* 1.08*  CALCIUM 9.7  --   --  9.1 9.5 9.5 9.7  MG  --  1.7 1.8  --  1.7 1.9 1.8  PHOS  --   --   --   --   --  3.0 2.2*    ABG: No results for input(s): PHART, PCO2ART, PO2ART, HCO3, O2SAT in the last 168 hours.  Liver Function Tests: Recent Labs  Lab 11/27/17 0724 11/29/17 0637 12/01/17 0602  AST 24  --   --   ALT 18  --   --   ALKPHOS 181*  --   --   BILITOT 0.8  --    --   PROT 5.2*  --   --   ALBUMIN 1.9* 2.1* 2.1*   No results for input(s): LIPASE, AMYLASE in the last 168 hours. No results for input(s): AMMONIA in the last 168 hours.  CBC: Recent Labs  Lab 11/25/17 0807 11/28/17 0654 12/01/17 0602  WBC 6.9 4.4 4.5  HGB 9.0* 9.3* 8.7*  HCT 30.2* 31.3* 27.9*  MCV 95.3 96.6 91.5  PLT 212 235 244    Cardiac Enzymes: No results for input(s): CKTOTAL, CKMB, CKMBINDEX, TROPONINI in the last 168 hours.  BNP (last 3 results) No results for input(s): BNP in the last 8760 hours.  ProBNP (last 3 results) No results for input(s): PROBNP in the last 8760 hours.  Radiological Exams: Ir Konrad Penta Perit Cath [facility]  Result Date: 11/29/2017 INDICATION: 62 year old female with history of recurrent right-sided pleural effusion secondary to congestive heart failure. She presents today for tunneled pleural catheter placement, and possible related mechanical pleurodesis. EXAM: IMAGE GUIDED RIGHT PLEURAL TUNNELED DRAINAGE CATHETER MEDICATIONS: 2  G ANCEF the antibiotics were administered within an appropriate time frame prior to the initiation of the procedure. ANESTHESIA/SEDATION: Fentanyl 50 mcg IV; Versed 1.0 mg IV Moderate Sedation Time:  13 minutes The patient was continuously monitored during the procedure by the interventional radiology nurse under my direct supervision. COMPLICATIONS: None PROCEDURE: The procedure, risks, benefits, and alternatives were explained to the patient and the patient's family. Specific risks that were addressed included bleeding, infection, pneumothorax, need for further procedure, chance of delayed pneumothorax or hemorrhage, hemoptysis, cardiopulmonary collapse, death. Questions regarding the procedure were encouraged and answered. The patient understands and consents to the procedure. The right chest wall was prepped with Betadine in a sterile fashion, and a sterile drape was applied covering the operative field. A sterile gown and  sterile gloves were used for the procedure. Local anesthesia was provided with 1% Lidocaine. Ultrasound image documentation was performed. After creating a small skin incision, a 19 gauge needle was advanced into the pleural cavity under ultrasound guidance. A guide wire was then advanced under fluoroscopy into the pleural space. Pleural access was dilated serially and a 16-French peel-away sheath placed. The skin and subcutaneous tissues were generously infiltrated with 1% lidocaine from the puncture site over the pleura along the intercostal margin anteriorly. A small stab incision was made with 11 blade scalpel at the insertion site of the catheter, and the catheter was back tunneled to the site at the pleural puncture. A tunneled pleural, cuffed drainage catheter was placed. This was tunneled from the incision 5 cm anterior to the pleural access to the access site. The catheter was advanced through the peel-away sheath. The sheath was then removed. Final catheter positioning was confirmed with a fluoroscopic spot image. The access incision was closed with derma bond. Dermabond was applied to the catheterization incision. Large volume thoracentesis was performed through the new catheter utilizing gravity drainage bag. The patient tolerated the procedure well and remained hemodynamically stable throughout. No complications were encountered and no significant blood loss was encountered. IMPRESSION: Status post right tunneled pleural catheter placement. Catheter ready for use. Signed, Yvone NeuJaime S. Reyne DumasWagner, DO, RPVI [facility]iology Special[facility] Electronically Signed   By: Gilmer MorJaime  Wagner D.O.   On: 11/29/2017 11:36   Dg Chest Port 1 View  Result Date: 11/30/2017 CLINICAL DATA:  Follow-up pleural effusion EXAM: PORTABLE CHEST 1 VIEW COMPARISON:  11/28/2017 FINDINGS: Cardiac shadow remains enlarged. Postsurgical changes are noted. Tracheostomy tube and nasogastric catheter are  noted in satisfactory position. New right PleurX catheter is noted in satisfactory position. Interval reduction in right-sided pleural effusion is seen. No pneumothorax is noted. Bibasilar atelectatic changes are seen. IMPRESSION: Reduction in right-sided pleural effusion following PleurX catheter placement. Bibasilar atelectatic changes are seen. Electronically Signed   By: Alcide CleverMark  Lukens M.D.   On: 11/30/2017 10:41    Assessment/Plan Principal Problem:   Acute on chronic respiratory failure with hypoxia (HCC) Active Problems:   Chronic respiratory failure with hypoxia (HCC)   Acute on chronic systolic CHF (congestive heart failure) (HCC)   Acute metabolic encephalopathy   Tracheostomy status (HCC)   Chronic atrial fibrillation (HCC)   Pleural effusion, right   Major depressive disorder, recurrent episode, mild (HCC)   1. Acute on chronic respiratory failure with hypoxia we will continue with full support with goal on the pressure support wean is for 8 hours.  Titrate oxygen continue aggressive pulmonary toilet 2. Acute on chronic systolic heart failure monitor x-rays patient currently seems to be  compensated. 3. Metabolic encephalopathy grossly unchanged continue present management 4. Chronic atrial fibrillation rate is controlled 5. Pleural effusion now has a Pleurx catheter we will continue with supportive care titrate as tolerated continue to be careful about protein loss as she is having more than 500 cc of drainage almost daily   I have personally seen and evaluated the patient, evaluated laboratory and imaging results, formulated the assessment and plan and placed orders. The Patient requires high complexity decision making for assessment and support.  Case was discussed on Rounds with the Respiratory Therapy Staff  Yevonne Pax, MD [facility] Pulmonary Critical Care Medicine Sleep Medicine

## 2017-12-02 ENCOUNTER — Other Ambulatory Visit (HOSPITAL_COMMUNITY): Payer: Self-pay

## 2017-12-02 DIAGNOSIS — J9621 Acute and chronic respiratory failure with hypoxia: Secondary | ICD-10-CM | POA: Diagnosis not present

## 2017-12-02 DIAGNOSIS — I5023 Acute on chronic systolic (congestive) heart failure: Secondary | ICD-10-CM | POA: Diagnosis not present

## 2017-12-02 DIAGNOSIS — I482 Chronic atrial fibrillation: Secondary | ICD-10-CM | POA: Diagnosis not present

## 2017-12-02 DIAGNOSIS — G9341 Metabolic encephalopathy: Secondary | ICD-10-CM | POA: Diagnosis not present

## 2017-12-02 NOTE — Progress Notes (Signed)
Pulmonary Critical Care Medicine [facility] [place]   PULMONARY CRITICAL CARE SERVICE  PROGRESS NOTE  Date of Service: 12/02/2017  Glenda Walker  ZOX:[id number]  DOB: 01/02/1956   DOA: 10/31/2017  Referring Physician: Carron Curie, MD  HPI: Glenda Walker is a 63 y.o. female seen for follow up of Acute on Chronic Respiratory Failure.  She is awake comfortable without distress she did about 8 hours of pressure support yesterday  Medications: Reviewed on Rounds  Physical Exam:  Vitals: Temperature 97.5 pulse 85 respiratory rate 22 blood pressure 128/98 saturations are 99%  Ventilator Settings currently was on full support assist control FiO2 20% tidal volume 397 PEEP 5  . General: Comfortable at this time . Eyes: Grossly normal lids, irises & conjunctiva . ENT: grossly tongue is normal . Neck: no obvious mass . Cardiovascular: S1 S2 normal no gallop . Respiratory: No rhonchi or rales are noted at this time . Abdomen: soft . Skin: no rash seen on limited exam . Musculoskeletal: not rigid . Psychiatric:unable to assess . Neurologic: no seizure no involuntary movements         Lab Data:   Basic Metabolic Panel: Recent Labs  Lab 11/26/17 1143 11/27/17 0724 11/28/17 0654 11/29/17 0637 12/01/17 0602  NA  --  138 137 136 135  K 3.4* 3.5 3.7 3.5 3.5  CL  --  96* 95* 95* 93*  CO2  --  34* 30 33* 29  GLUCOSE  --  96 87 98 91  BUN  --  23 24* 20 21  CREATININE  --  1.00 1.08* 1.08* 1.08*  CALCIUM  --  9.1 9.5 9.5 9.7  MG 1.8  --  1.7 1.9 1.8  PHOS  --   --   --  3.0 2.2*    ABG: No results for input(s): PHART, PCO2ART, PO2ART, HCO3, O2SAT in the last 168 hours.  Liver Function Tests: Recent Labs  Lab 11/27/17 0724 11/29/17 0637 12/01/17 0602  AST 24  --   --   ALT 18  --   --   ALKPHOS 181*  --   --   BILITOT 0.8  --   --   PROT 5.2*  --   --   ALBUMIN 1.9* 2.1* 2.1*   No results for input(s): LIPASE, AMYLASE in the last 168 hours. No  results for input(s): AMMONIA in the last 168 hours.  CBC: Recent Labs  Lab 11/28/17 0654 12/01/17 0602  WBC 4.4 4.5  HGB 9.3* 8.7*  HCT 31.3* 27.9*  MCV 96.6 91.5  PLT 235 244    Cardiac Enzymes: No results for input(s): CKTOTAL, CKMB, CKMBINDEX, TROPONINI in the last 168 hours.  BNP (last 3 results) No results for input(s): BNP in the last 8760 hours.  ProBNP (last 3 results) No results for input(s): PROBNP in the last 8760 hours.  Radiological Exams: Ct Abdomen Wo Contrast  Result Date: 12/01/2017 CLINICAL DATA:  Preop planning for gastrostomy tube EXAM: CT ABDOMEN WITHOUT CONTRAST TECHNIQUE: Multidetector CT imaging of the abdomen was performed following the standard protocol without IV contrast. COMPARISON:  11/10/2017 FINDINGS: Lower chest: Near complete resolution of the right pleural effusion. Persistent small/moderate left pleural effusion with consolidation/atelectasis in the visualized left lower lung. Previous median sternotomy. Four-chamber cardiac enlargement. Hepatobiliary: Peripherally calcified stone in the lumen of the distended gallbladder as before. Liver unremarkable. No definite biliary ductal dilatation identified. Pancreas: Unremarkable. No pancreatic ductal dilatation or surrounding inflammatory changes. Spleen: Normal in size  without focal abnormality. Adrenals/Urinary Tract: Adrenal glands are unremarkable. Kidneys are normal, without renal calculi, focal lesion, or hydronephrosis. Stomach/Bowel: Nasogastric tube decompresses the stomach. Visualized portions of small bowel and colon are nondistended. The transverse colon is interposed between the anterior abdominal wall and liver. Limited percutaneous window evident for gastrostomy placement, however a safe percutaneous approach had been evident on the prior study. Vascular/Lymphatic: Moderate aortoiliac atheromatous plaque, ectatic distal aorta measured at least 2.8 cm diameter. No definite abdominal adenopathy.  Other: Mild ascites.  No free air. Musculoskeletal: Body wall anasarca. Previous median sternotomy. Negative for fracture or worrisome bone lesion. IMPRESSION: 1. Abdominal ascites, a relative contraindication to percutaneous gastrostomy placement. 2. Colonic interposition between the decompressed stomach in the anterior abdominal wall. If percutaneous gastrostomy is attempted, recommend colonic opacification to avoid transgression. 3. Interval resolution of right pleural effusion. 4. Cholelithiasis 5. Ectatic abdominal aorta at risk for aneurysm development. Recommend followup by ultrasound in 5 years. This recommendation follows ACR consensus guidelines: White Paper of the ACR Incidental Findings Committee II on Vascular Findings. J Am Coll Radiol 2013; 10:789-794. Electronically Signed   By: Corlis Leak  Hassell M.D.   On: 12/01/2017 14:35   Dg Chest Port 1 View  Result Date: 12/02/2017 CLINICAL DATA:  Respiratory failure EXAM: PORTABLE CHEST 1 VIEW COMPARISON:  11/30/2017 FINDINGS: Tracheostomy tube and nasogastric catheter are again noted and stable. Postsurgical changes are again seen. Right PleurX catheter is noted in satisfactory position. Stable left retrocardiac atelectasis is noted. Improvement in right-sided pleural effusion is noted when compare with the prior exam. Some minimal right basilar atelectasis remains. IMPRESSION: Bibasilar atelectasis left greater than right stable from the previous exam. Decreased pleural fluid on the right. Electronically Signed   By: Alcide CleverMark  Lukens M.D.   On: 12/02/2017 08:18    Assessment/Plan Principal Problem:   Acute on chronic respiratory failure with hypoxia (HCC) Active Problems:   Chronic respiratory failure with hypoxia (HCC)   Acute on chronic systolic CHF (congestive heart failure) (HCC)   Acute metabolic encephalopathy   Tracheostomy status (HCC)   Chronic atrial fibrillation (HCC)   Pleural effusion, right   Major depressive disorder, recurrent episode,  mild (HCC)   1. Acute on chronic respiratory failure with hypoxia we will continue with weaning on pressure support as tolerated titrate oxygen continue aggressive pulmonary toilet 2. Acute on chronic systolic heart failure at baseline continue to monitor. 3. Acute metabolic encephalopathy grossly has been showing improvement 4. Chronic atrial fibrillation rate is controlled 5. Right-sided pleural effusion has continuous drainage 6. Depression at baseline   I have personally seen and evaluated the patient, evaluated laboratory and imaging results, formulated the assessment and plan and placed orders. The Patient requires high complexity decision making for assessment and support.  Case was discussed on Rounds with the Respiratory Therapy Staff  Yevonne PaxSaadat A Calianne Larue, MD [facility] Pulmonary Critical Care Medicine Sleep Medicine

## 2017-12-02 NOTE — Progress Notes (Signed)
Patient ID: Glenda Walker, female   DOB: 06/04/1955, 62 y.o.   MRN: [id number]   Re request for Gastric tube placement  Was denied once before secondary ascites and associated risks  New CT yesterday Dr Fredia SorrowYamagata has reviewed imaging Still with ascites And also colon in way of stomach NO safe window This pt is not a candidate for percutaneous gastric tube placement  Please consult surgery if feel appropriate

## 2017-12-03 DIAGNOSIS — G9341 Metabolic encephalopathy: Secondary | ICD-10-CM | POA: Diagnosis not present

## 2017-12-03 DIAGNOSIS — I482 Chronic atrial fibrillation: Secondary | ICD-10-CM | POA: Diagnosis not present

## 2017-12-03 DIAGNOSIS — I5023 Acute on chronic systolic (congestive) heart failure: Secondary | ICD-10-CM | POA: Diagnosis not present

## 2017-12-03 DIAGNOSIS — J9621 Acute and chronic respiratory failure with hypoxia: Secondary | ICD-10-CM | POA: Diagnosis not present

## 2017-12-03 LAB — RENAL FUNCTION PANEL
ALBUMIN: 2.2 g/dL — AB (ref 3.5–5.0)
ANION GAP: 8 (ref 5–15)
BUN: 24 mg/dL — ABNORMAL HIGH (ref 8–23)
CHLORIDE: 94 mmol/L — AB (ref 98–111)
CO2: 35 mmol/L — ABNORMAL HIGH (ref 22–32)
Calcium: 9.6 mg/dL (ref 8.9–10.3)
Creatinine, Ser: 1.04 mg/dL — ABNORMAL HIGH (ref 0.44–1.00)
GFR, EST NON AFRICAN AMERICAN: 57 mL/min — AB (ref >60)
Glucose, Bld: 89 mg/dL (ref 70–99)
POTASSIUM: 3 mmol/L — AB (ref 3.5–5.1)
Phosphorus: 3.1 mg/dL (ref 2.5–4.6)
Sodium: 137 mmol/L (ref 135–145)

## 2017-12-03 LAB — MAGNESIUM: Magnesium: 1.8 mg/dL (ref 1.7–2.4)

## 2017-12-03 LAB — CBC
HCT: 28.5 % — ABNORMAL LOW (ref 36.0–46.0)
Hemoglobin: 8.8 g/dL — ABNORMAL LOW (ref 12.0–15.0)
MCH: 28.6 pg (ref 26.0–34.0)
MCHC: 30.9 g/dL (ref 30.0–36.0)
MCV: 92.5 fL (ref 78.0–100.0)
Platelets: 256 10*3/uL (ref 150–400)
RBC: 3.08 MIL/uL — ABNORMAL LOW (ref 3.87–5.11)
RDW: 19.9 % — ABNORMAL HIGH (ref 11.5–15.5)
WBC: 5.6 10*3/uL (ref 4.0–10.5)

## 2017-12-03 LAB — BRAIN NATRIURETIC PEPTIDE: B Natriuretic Peptide: 727.4 pg/mL — ABNORMAL HIGH (ref 0.0–100.0)

## 2017-12-03 NOTE — Progress Notes (Signed)
[facility]   PULMONARY CRITICAL CARE SERVICE  PROGRESS NOTE  Date of Service: 12/03/2017  Glenda Bunnellatricia A Archila  RUE:[id number]:[id number]  DOB: 03/22/1955   DOA: 10/31/2017  Referring Physician: Carron CurieAli Hijazi, MD  HPI: Glenda Walker is a 62 y.o. female seen for follow up of Acute on Chronic Respiratory Failure.  She is resting comfortably she did yesterday about 12 hours on pressure support right now is on full support assist control  Medications: Reviewed on Rounds  Physical Exam:  Vitals: Temperature 97.7 pulse 85 respiratory rate 23 blood pressure 123/78 saturations 100%  Ventilator Settings currently is on assist control FiO2 is 28% tidal volume 519 PEEP 5  . General: Comfortable at this time . Eyes: Grossly normal lids, irises & conjunctiva . ENT: grossly tongue is normal . Neck: no obvious mass . Cardiovascular: S1 S2 normal no gallop . Respiratory: No rhonchi or rales are noted at this time . Abdomen: soft . Skin: no rash seen on limited exam . Musculoskeletal: not rigid . Psychiatric:unable to assess . Neurologic: no seizure no involuntary movements         Lab Data:   Basic Metabolic Panel: Recent Labs  Lab 11/27/17 0724 11/28/17 0654 11/29/17 0637 12/01/17 0602 12/03/17 0701  NA 138 137 136 135 137  K 3.5 3.7 3.5 3.5 3.0*  CL 96* 95* 95* 93* 94*  CO2 34* 30 33* 29 35*  GLUCOSE 96 87 98 91 89  BUN 23 24* 20 21 24*  CREATININE 1.00 1.08* 1.08* 1.08* 1.04*  CALCIUM 9.1 9.5 9.5 9.7 9.6  MG  --  1.7 1.9 1.8 1.8  PHOS  --   --  3.0 2.2* 3.1    ABG: No results for input(s): PHART, PCO2ART, PO2ART, HCO3, O2SAT in the last 168 hours.  Liver Function Tests: Recent Labs  Lab 11/27/17 0724 11/29/17 0637 12/01/17 0602 12/03/17 0701  AST 24  --   --   --   ALT 18  --   --   --   ALKPHOS 181*  --   --   --   BILITOT 0.8  --   --   --   PROT 5.2*  --   --   --   ALBUMIN 1.9* 2.1* 2.1* 2.2*   No results for  input(s): LIPASE, AMYLASE in the last 168 hours. No results for input(s): AMMONIA in the last 168 hours.  CBC: Recent Labs  Lab 11/28/17 0654 12/01/17 0602 12/03/17 0701  WBC 4.4 4.5 5.6  HGB 9.3* 8.7* 8.8*  HCT 31.3* 27.9* 28.5*  MCV 96.6 91.5 92.5  PLT 235 244 256    Cardiac Enzymes: No results for input(s): CKTOTAL, CKMB, CKMBINDEX, TROPONINI in the last 168 hours.  BNP (last 3 results) Recent Labs    12/03/17 0701  BNP 727.4*    ProBNP (last 3 results) No results for input(s): PROBNP in the last 8760 hours.  Radiological Exams: Dg Chest Port 1 View  Result Date: 12/02/2017 CLINICAL DATA:  Respiratory failure EXAM: PORTABLE CHEST 1 VIEW COMPARISON:  11/30/2017 FINDINGS: Tracheostomy tube and nasogastric catheter are again noted and stable. Postsurgical changes are again seen. Right PleurX catheter is noted in satisfactory position. Stable left retrocardiac atelectasis is noted. Improvement in right-sided pleural effusion is noted when compare with the prior exam. Some minimal right basilar atelectasis remains. IMPRESSION: Bibasilar atelectasis left greater than right stable from the previous exam. Decreased pleural fluid on the right. Electronically  Signed   By: Alcide Clever M.D.   On: 12/02/2017 08:18    Assessment/Plan Principal Problem:   Acute on chronic respiratory failure with hypoxia (HCC) Active Problems:   Chronic respiratory failure with hypoxia (HCC)   Acute on chronic systolic CHF (congestive heart failure) (HCC)   Acute metabolic encephalopathy   Tracheostomy status (HCC)   Chronic atrial fibrillation (HCC)   Pleural effusion, right   Major depressive disorder, recurrent episode, mild (HCC)   1. Acute on chronic respiratory failure with hypoxia we will continue with full support on assist control for now.  Yesterday patient did wean on pressure support will reassess again tomorrow. 2. Acute on chronic systolic heart failure we will continue with  diuresis as tolerated. 3. Metabolic encephalopathy unchanged. 4. Right-sided pleural effusions Pleurx catheter in place 5. Chronic atrial fibrillation rate is controlled 6. Tracheostomy remains in place difficult to wean   I have personally seen and evaluated the patient, evaluated laboratory and imaging results, formulated the assessment and plan and placed orders. The Patient requires high complexity decision making for assessment and support.  Case was discussed on Rounds with the Respiratory Therapy Staff  Yevonne Pax, MD [facility] Pulmonary Critical Care Medicine Sleep Medicine

## 2017-12-04 DIAGNOSIS — G9341 Metabolic encephalopathy: Secondary | ICD-10-CM | POA: Diagnosis not present

## 2017-12-04 DIAGNOSIS — J9621 Acute and chronic respiratory failure with hypoxia: Secondary | ICD-10-CM | POA: Diagnosis not present

## 2017-12-04 DIAGNOSIS — I482 Chronic atrial fibrillation: Secondary | ICD-10-CM | POA: Diagnosis not present

## 2017-12-04 DIAGNOSIS — I5023 Acute on chronic systolic (congestive) heart failure: Secondary | ICD-10-CM | POA: Diagnosis not present

## 2017-12-04 LAB — BASIC METABOLIC PANEL
ANION GAP: 13 (ref 5–15)
BUN: 29 mg/dL — AB (ref 8–23)
CO2: 31 mmol/L (ref 22–32)
Calcium: 9.9 mg/dL (ref 8.9–10.3)
Chloride: 94 mmol/L — ABNORMAL LOW (ref 98–111)
Creatinine, Ser: 1.12 mg/dL — ABNORMAL HIGH (ref 0.44–1.00)
GFR, EST NON AFRICAN AMERICAN: 52 mL/min — AB (ref >60)
Glucose, Bld: 111 mg/dL — ABNORMAL HIGH (ref 70–99)
POTASSIUM: 3.4 mmol/L — AB (ref 3.5–5.1)
SODIUM: 138 mmol/L (ref 135–145)

## 2017-12-04 LAB — MAGNESIUM: MAGNESIUM: 1.6 mg/dL — AB (ref 1.7–2.4)

## 2017-12-04 NOTE — Progress Notes (Signed)
[facility] [place]   PULMONARY CRITICAL CARE SERVICE  PROGRESS NOTE  Date of Service: 12/04/2017  Glenda Bunnellatricia A Rocks  XBJ:[id number]:[id number]  DOB: 07/20/1955   DOA: 10/31/2017  Referring Physician: Carron CurieAli Hijazi, MD  HPI: Glenda Walker is a 62 y.o. female seen for follow up of Acute on Chronic Respiratory Failure.  Patient right now is on pressure support mode the chest x-ray is actually looking better.  She did not have much fluid on the right side.  She is been weaning on pressure support and tolerating that fairly well  Medications: Reviewed on Rounds  Physical Exam:  Vitals: Temperature 97.4 pulse 87 respiratory rate 22 blood pressure 122/85 saturations 100%  Ventilator Settings mode of ventilation pressure support FiO2 20% tidal volume 520 pressure support 12 PEEP 5  . General: Comfortable at this time . Eyes: Grossly normal lids, irises & conjunctiva . ENT: grossly tongue is normal . Neck: no obvious mass . Cardiovascular: S1 S2 normal no gallop . Respiratory: Few rhonchi no rales are noted at this time . Abdomen: soft . Skin: no rash seen on limited exam . Musculoskeletal: not rigid . Psychiatric:unable to assess . Neurologic: no seizure no involuntary movements         Lab Data:   Basic Metabolic Panel: Recent Labs  Lab 11/28/17 0654 11/29/17 0637 12/01/17 0602 12/03/17 0701 12/04/17 0619  NA 137 136 135 137 138  K 3.7 3.5 3.5 3.0* 3.4*  CL 95* 95* 93* 94* 94*  CO2 30 33* 29 35* 31  GLUCOSE 87 98 91 89 111*  BUN 24* 20 21 24* 29*  CREATININE 1.08* 1.08* 1.08* 1.04* 1.12*  CALCIUM 9.5 9.5 9.7 9.6 9.9  MG 1.7 1.9 1.8 1.8 1.6*  PHOS  --  3.0 2.2* 3.1  --     ABG: No results for input(s): PHART, PCO2ART, PO2ART, HCO3, O2SAT in the last 168 hours.  Liver Function Tests: Recent Labs  Lab 11/29/17 0637 12/01/17 0602 12/03/17 0701  ALBUMIN 2.1* 2.1* 2.2*   No results for input(s): LIPASE, AMYLASE in the last 168  hours. No results for input(s): AMMONIA in the last 168 hours.  CBC: Recent Labs  Lab 11/28/17 0654 12/01/17 0602 12/03/17 0701  WBC 4.4 4.5 5.6  HGB 9.3* 8.7* 8.8*  HCT 31.3* 27.9* 28.5*  MCV 96.6 91.5 92.5  PLT 235 244 256    Cardiac Enzymes: No results for input(s): CKTOTAL, CKMB, CKMBINDEX, TROPONINI in the last 168 hours.  BNP (last 3 results) Recent Labs    12/03/17 0701  BNP 727.4*    ProBNP (last 3 results) No results for input(s): PROBNP in the last 8760 hours.  Radiological Exams: No results found.  Assessment/Plan Principal Problem:   Acute on chronic respiratory failure with hypoxia (HCC) Active Problems:   Chronic respiratory failure with hypoxia (HCC)   Acute on chronic systolic CHF (congestive heart failure) (HCC)   Acute metabolic encephalopathy   Tracheostomy status (HCC)   Chronic atrial fibrillation (HCC)   Pleural effusion, right   Major depressive disorder, recurrent episode, mild (HCC)   1. Acute on chronic respiratory failure with hypoxia we will continue with weaning on pressure support the goal is for 16 hours.  Will titrate oxygen as tolerated. 2. Acute on chronic systolic heart failure doing better she is got less edema noted. 3. Metabolic encephalopathy cleared 4. Chronic atrial fibrillation rate is controlled 5. Right-sided pleural effusion improved we will continue to monitor she  has a Pleurx catheter in place   I have personally seen and evaluated the patient, evaluated laboratory and imaging results, formulated the assessment and plan and placed orders. The Patient requires high complexity decision making for assessment and support.  Case was discussed on Rounds with the Respiratory Therapy Staff  Yevonne Pax, MD [facility] Pulmonary Critical Care Medicine Sleep Medicine

## 2017-12-05 DIAGNOSIS — G9341 Metabolic encephalopathy: Secondary | ICD-10-CM | POA: Diagnosis not present

## 2017-12-05 DIAGNOSIS — I482 Chronic atrial fibrillation: Secondary | ICD-10-CM | POA: Diagnosis not present

## 2017-12-05 DIAGNOSIS — I5023 Acute on chronic systolic (congestive) heart failure: Secondary | ICD-10-CM | POA: Diagnosis not present

## 2017-12-05 DIAGNOSIS — J9621 Acute and chronic respiratory failure with hypoxia: Secondary | ICD-10-CM | POA: Diagnosis not present

## 2017-12-05 LAB — RENAL FUNCTION PANEL
ALBUMIN: 2.3 g/dL — AB (ref 3.5–5.0)
Anion gap: 11 (ref 5–15)
BUN: 34 mg/dL — ABNORMAL HIGH (ref 8–23)
CHLORIDE: 93 mmol/L — AB (ref 98–111)
CO2: 32 mmol/L (ref 22–32)
Calcium: 10.1 mg/dL (ref 8.9–10.3)
Creatinine, Ser: 1.22 mg/dL — ABNORMAL HIGH (ref 0.44–1.00)
GFR, EST AFRICAN AMERICAN: 54 mL/min — AB (ref >60)
GFR, EST NON AFRICAN AMERICAN: 47 mL/min — AB (ref >60)
Glucose, Bld: 110 mg/dL — ABNORMAL HIGH (ref 70–99)
PHOSPHORUS: 2.5 mg/dL (ref 2.5–4.6)
POTASSIUM: 3.8 mmol/L (ref 3.5–5.1)
Sodium: 136 mmol/L (ref 135–145)

## 2017-12-05 LAB — CBC
HEMATOCRIT: 27.3 % — AB (ref 36.0–46.0)
HEMOGLOBIN: 8.5 g/dL — AB (ref 12.0–15.0)
MCH: 28.5 pg (ref 26.0–34.0)
MCHC: 31.1 g/dL (ref 30.0–36.0)
MCV: 91.6 fL (ref 78.0–100.0)
Platelets: 229 10*3/uL (ref 150–400)
RBC: 2.98 MIL/uL — ABNORMAL LOW (ref 3.87–5.11)
RDW: 19.7 % — ABNORMAL HIGH (ref 11.5–15.5)
WBC: 7.4 10*3/uL (ref 4.0–10.5)

## 2017-12-05 LAB — MAGNESIUM: Magnesium: 2 mg/dL (ref 1.7–2.4)

## 2017-12-05 NOTE — Progress Notes (Signed)
Pulmonary Critical Care [facility] [place]   PULMONARY CRITICAL CARE SERVICE  PROGRESS NOTE  Date of Service: 12/05/2017  Maryfrances Bunnellatricia A Mcgillivray  WUJ:[id number]:[id number]  DOB: 05/10/1955   DOA: 10/31/2017  Referring Physician: Carron CurieAli Hijazi, MD  HPI: Maryfrances Bunnellatricia A Pergola is a 62 y.o. female seen for follow up of Acute on Chronic Respiratory Failure.  Patient has been on pressure support and currently is on 28% FiO2.  She is probably ready to go for T collar trial  Medications: Reviewed on Rounds  Physical Exam:  Vitals: Temperature 97.3 pulse 86 respiratory rate 26 blood pressure 121/66 saturations 100%  Ventilator Settings mode of ventilation pressure support FiO2 28% tidal volume 400 pressure support 12 PEEP 5  . General: Comfortable at this time . Eyes: Grossly normal lids, irises & conjunctiva . ENT: grossly tongue is normal . Neck: no obvious mass . Cardiovascular: S1 S2 normal no gallop . Respiratory: No rhonchi or rales are noted at this time . Abdomen: soft . Skin: no rash seen on limited exam . Musculoskeletal: not rigid . Psychiatric:unable to assess . Neurologic: no seizure no involuntary movements         Lab Data:   Basic Metabolic Panel: Recent Labs  Lab 11/29/17 0637 12/01/17 0602 12/03/17 0701 12/04/17 0619 12/05/17 0700  NA 136 135 137 138 136  K 3.5 3.5 3.0* 3.4* 3.8  CL 95* 93* 94* 94* 93*  CO2 33* 29 35* 31 32  GLUCOSE 98 91 89 111* 110*  BUN 20 21 24* 29* 34*  CREATININE 1.08* 1.08* 1.04* 1.12* 1.22*  CALCIUM 9.5 9.7 9.6 9.9 10.1  MG 1.9 1.8 1.8 1.6* 2.0  PHOS 3.0 2.2* 3.1  --  2.5    ABG: No results for input(s): PHART, PCO2ART, PO2ART, HCO3, O2SAT in the last 168 hours.  Liver Function Tests: Recent Labs  Lab 11/29/17 0637 12/01/17 0602 12/03/17 0701 12/05/17 0700  ALBUMIN 2.1* 2.1* 2.2* 2.3*   No results for input(s): LIPASE, AMYLASE in the last 168 hours. No results for input(s): AMMONIA in the last 168  hours.  CBC: Recent Labs  Lab 12/01/17 0602 12/03/17 0701 12/05/17 0700  WBC 4.5 5.6 7.4  HGB 8.7* 8.8* 8.5*  HCT 27.9* 28.5* 27.3*  MCV 91.5 92.5 91.6  PLT 244 256 229    Cardiac Enzymes: No results for input(s): CKTOTAL, CKMB, CKMBINDEX, TROPONINI in the last 168 hours.  BNP (last 3 results) Recent Labs    12/03/17 0701  BNP 727.4*    ProBNP (last 3 results) No results for input(s): PROBNP in the last 8760 hours.  Radiological Exams: No results found.  Assessment/Plan Principal Problem:   Acute on chronic respiratory failure with hypoxia (HCC) Active Problems:   Chronic respiratory failure with hypoxia (HCC)   Acute on chronic systolic CHF (congestive heart failure) (HCC)   Acute metabolic encephalopathy   Tracheostomy status (HCC)   Chronic atrial fibrillation (HCC)   Pleural effusion, right   Major depressive disorder, recurrent episode, mild (HCC)   1. Acute on chronic respiratory failure with hypoxia we will continue to advance the wean today I asked respiratory therapy to try T collar trials once again. 2. Acute on chronic systolic heart failure compensated we will continue with supportive care 3. Acute metabolic encephalopathy grossly unchanged 4. Chronic atrial fibrillation rate is controlled 5. Rate right pleural effusion follow-up x-ray as necessary   I have personally seen and evaluated the patient, evaluated laboratory and imaging results, formulated the  assessment and plan and placed orders. The Patient requires high complexity decision making for assessment and support.  Case was discussed on Rounds with the Respiratory Therapy Staff  Allyne Gee, MD [facility] Pulmonary Critical Care Medicine Sleep Medicine

## 2017-12-06 ENCOUNTER — Other Ambulatory Visit (HOSPITAL_COMMUNITY): Payer: Self-pay

## 2017-12-06 DIAGNOSIS — J9621 Acute and chronic respiratory failure with hypoxia: Secondary | ICD-10-CM | POA: Diagnosis not present

## 2017-12-06 DIAGNOSIS — I5023 Acute on chronic systolic (congestive) heart failure: Secondary | ICD-10-CM | POA: Diagnosis not present

## 2017-12-06 DIAGNOSIS — G9341 Metabolic encephalopathy: Secondary | ICD-10-CM | POA: Diagnosis not present

## 2017-12-06 DIAGNOSIS — I482 Chronic atrial fibrillation: Secondary | ICD-10-CM | POA: Diagnosis not present

## 2017-12-06 NOTE — Progress Notes (Signed)
[facility] [place]   PULMONARY CRITICAL CARE SERVICE  PROGRESS NOTE  Date of Service: 12/06/2017  Glenda Bunnellatricia A Hixon  MVH:[id number]  DOB: 07/16/1955   DOA: 10/31/2017  Referring Physician: Carron CurieAli Hijazi, MD  HPI: Glenda Walker is a 62 y.o. female seen for follow up of Acute on Chronic Respiratory Failure.  Patient underwent a swallowing assessment apparently did well.  Hopefully should be able to have the NG tube  Medications: Reviewed on Rounds  Physical Exam:  Vitals: Temperature 98 pulse 90 respiratory rate 19 blood pressure 120/60 saturations 99%  Ventilator Settings currently off the ventilator on T collar trials  . General: Comfortable at this time . Eyes: Grossly normal lids, irises & conjunctiva . ENT: grossly tongue is normal . Neck: no obvious mass . Cardiovascular: S1 S2 normal no gallop . Respiratory: Coarse breath sounds no rhonchi . Abdomen: soft . Skin: no rash seen on limited exam . Musculoskeletal: not rigid . Psychiatric:unable to assess . Neurologic: no seizure no involuntary movements         Lab Data:   Basic Metabolic Panel: Recent Labs  Lab 12/01/17 0602 12/03/17 0701 12/04/17 0619 12/05/17 0700  NA 135 137 138 136  K 3.5 3.0* 3.4* 3.8  CL 93* 94* 94* 93*  CO2 29 35* 31 32  GLUCOSE 91 89 111* 110*  BUN 21 24* 29* 34*  CREATININE 1.08* 1.04* 1.12* 1.22*  CALCIUM 9.7 9.6 9.9 10.1  MG 1.8 1.8 1.6* 2.0  PHOS 2.2* 3.1  --  2.5    ABG: No results for input(s): PHART, PCO2ART, PO2ART, HCO3, O2SAT in the last 168 hours.  Liver Function Tests: Recent Labs  Lab 12/01/17 0602 12/03/17 0701 12/05/17 0700  ALBUMIN 2.1* 2.2* 2.3*   No results for input(s): LIPASE, AMYLASE in the last 168 hours. No results for input(s): AMMONIA in the last 168 hours.  CBC: Recent Labs  Lab 12/01/17 0602 12/03/17 0701 12/05/17 0700  WBC 4.5 5.6 7.4  HGB 8.7* 8.8* 8.5*  HCT 27.9* 28.5* 27.3*  MCV 91.5  92.5 91.6  PLT 244 256 229    Cardiac Enzymes: No results for input(s): CKTOTAL, CKMB, CKMBINDEX, TROPONINI in the last 168 hours.  BNP (last 3 results) Recent Labs    12/03/17 0701  BNP 727.4*    ProBNP (last 3 results) No results for input(s): PROBNP in the last 8760 hours.  Radiological Exams: No results found.  Assessment/Plan Principal Problem:   Acute on chronic respiratory failure with hypoxia (HCC) Active Problems:   Chronic respiratory failure with hypoxia (HCC)   Acute on chronic systolic CHF (congestive heart failure) (HCC)   Acute metabolic encephalopathy   Tracheostomy status (HCC)   Chronic atrial fibrillation (HCC)   Pleural effusion, right   Major depressive disorder, recurrent episode, mild (HCC)   1. Acute on chronic respiratory failure with hypoxia patient is doing well with the wean on the T collar will continue 2. Pleural effusion has catheter in place 3. Chronic atrial fibrillation rate is controlled 4. Acute on chronic systolic heart failure diuresis tolerated 5. Metabolic encephalopathy cleared 6. Tracheostomy working towards 24 hours on the wean   I have personally seen and evaluated the patient, evaluated laboratory and imaging results, formulated the assessment and plan and placed orders. The Patient requires high complexity decision making for assessment and support.  Case was discussed on Rounds with the Respiratory Therapy Staff  Yevonne PaxSaadat A Hadyn Blanck, MD [facility] Pulmonary Critical Care  Medicine Sleep Medicine

## 2017-12-07 DIAGNOSIS — G9341 Metabolic encephalopathy: Secondary | ICD-10-CM | POA: Diagnosis not present

## 2017-12-07 DIAGNOSIS — I482 Chronic atrial fibrillation: Secondary | ICD-10-CM | POA: Diagnosis not present

## 2017-12-07 DIAGNOSIS — J9621 Acute and chronic respiratory failure with hypoxia: Secondary | ICD-10-CM | POA: Diagnosis not present

## 2017-12-07 DIAGNOSIS — I5023 Acute on chronic systolic (congestive) heart failure: Secondary | ICD-10-CM | POA: Diagnosis not present

## 2017-12-07 LAB — BASIC METABOLIC PANEL
Anion gap: 9 (ref 5–15)
BUN: 34 mg/dL — AB (ref 8–23)
CHLORIDE: 95 mmol/L — AB (ref 98–111)
CO2: 32 mmol/L (ref 22–32)
Calcium: 10.2 mg/dL (ref 8.9–10.3)
Creatinine, Ser: 1.08 mg/dL — ABNORMAL HIGH (ref 0.44–1.00)
GFR calc Af Amer: >60 mL/min (ref >60)
GFR, EST NON AFRICAN AMERICAN: 54 mL/min — AB (ref >60)
GLUCOSE: 105 mg/dL — AB (ref 70–99)
POTASSIUM: 3.9 mmol/L (ref 3.5–5.1)
Sodium: 136 mmol/L (ref 135–145)

## 2017-12-07 NOTE — Progress Notes (Signed)
Pulmonary Critical Care Medicine [facility] [place]   PULMONARY CRITICAL CARE SERVICE  PROGRESS NOTE  Date of Service: 12/07/2017  Glenda Walker  ZOX:[id number]  DOB: September 13, 1955   DOA: 10/31/2017  Referring Physician: Carron Curie, MD  HPI: Glenda Walker is a 62 y.o. female seen for follow up of Acute on Chronic Respiratory Failure.  Patient is on T collar she is actually been doing remarkably well since the fluid has been drained.  Currently is on 28% FiO2  Medications: Reviewed on Rounds  Physical Exam:  Vitals: Temperature 97.8 pulse 106 respiratory rate is 17 blood pressure 122/70 saturations 100%  Ventilator Settings off the ventilator on T collar FiO2 28%  . General: Comfortable at this time . Eyes: Grossly normal lids, irises & conjunctiva . ENT: grossly tongue is normal . Neck: no obvious mass . Cardiovascular: S1 S2 normal no gallop . Respiratory: No rhonchi no rales noted at this time . Abdomen: soft . Skin: no rash seen on limited exam . Musculoskeletal: not rigid . Psychiatric:unable to assess . Neurologic: no seizure no involuntary movements         Lab Data:   Basic Metabolic Panel: Recent Labs  Lab 12/01/17 0602 12/03/17 0701 12/04/17 0619 12/05/17 0700 12/07/17 0631  NA 135 137 138 136 136  K 3.5 3.0* 3.4* 3.8 3.9  CL 93* 94* 94* 93* 95*  CO2 29 35* 31 32 32  GLUCOSE 91 89 111* 110* 105*  BUN 21 24* 29* 34* 34*  CREATININE 1.08* 1.04* 1.12* 1.22* 1.08*  CALCIUM 9.7 9.6 9.9 10.1 10.2  MG 1.8 1.8 1.6* 2.0  --   PHOS 2.2* 3.1  --  2.5  --     ABG: No results for input(s): PHART, PCO2ART, PO2ART, HCO3, O2SAT in the last 168 hours.  Liver Function Tests: Recent Labs  Lab 12/01/17 0602 12/03/17 0701 12/05/17 0700  ALBUMIN 2.1* 2.2* 2.3*   No results for input(s): LIPASE, AMYLASE in the last 168 hours. No results for input(s): AMMONIA in the last 168 hours.  CBC: Recent Labs  Lab 12/01/17 0602 12/03/17 0701  12/05/17 0700  WBC 4.5 5.6 7.4  HGB 8.7* 8.8* 8.5*  HCT 27.9* 28.5* 27.3*  MCV 91.5 92.5 91.6  PLT 244 256 229    Cardiac Enzymes: No results for input(s): CKTOTAL, CKMB, CKMBINDEX, TROPONINI in the last 168 hours.  BNP (last 3 results) Recent Labs    12/03/17 0701  BNP 727.4*    ProBNP (last 3 results) No results for input(s): PROBNP in the last 8760 hours.  Radiological Exams: No results found.  Assessment/Plan Principal Problem:   Acute on chronic respiratory failure with hypoxia (HCC) Active Problems:   Chronic respiratory failure with hypoxia (HCC)   Acute on chronic systolic CHF (congestive heart failure) (HCC)   Acute metabolic encephalopathy   Tracheostomy status (HCC)   Chronic atrial fibrillation (HCC)   Pleural effusion, right   Major depressive disorder, recurrent episode, mild (HCC)   1. Acute on chronic respiratory failure with hypoxia continue weaning on T collar as tolerated.  Continue pulmonary toilet supportive care prognosis quite guarded.  I would like to eventually try to see if she is able to Over as noted the respiratory status is quite tenuous 2. Acute on chronic systolic heart failure monitor fluid status at this time 3. Metabolic encephalopathy much improved 4. Chronic atrial fibrillation rate is controlled 5. Right-sided pleural effusion clinically improved 6. Tracheostomy as above would like to  eventually try to see if she is able to do any capping trials   I have personally seen and evaluated the patient, evaluated laboratory and imaging results, formulated the assessment and plan and placed orders. The Patient requires high complexity decision making for assessment and support.  Case was discussed on Rounds with the Respiratory Therapy Staff  Yevonne Pax, MD [facility] Pulmonary Critical Care Medicine Sleep Medicine

## 2017-12-08 DIAGNOSIS — G9341 Metabolic encephalopathy: Secondary | ICD-10-CM | POA: Diagnosis not present

## 2017-12-08 DIAGNOSIS — I5023 Acute on chronic systolic (congestive) heart failure: Secondary | ICD-10-CM | POA: Diagnosis not present

## 2017-12-08 DIAGNOSIS — I482 Chronic atrial fibrillation: Secondary | ICD-10-CM | POA: Diagnosis not present

## 2017-12-08 DIAGNOSIS — J9621 Acute and chronic respiratory failure with hypoxia: Secondary | ICD-10-CM | POA: Diagnosis not present

## 2017-12-08 NOTE — Progress Notes (Signed)
Pulmonary Critical Care Medicine [facility] [place]   PULMONARY CRITICAL CARE SERVICE  PROGRESS NOTE  Date of Service: 12/08/2017  Glenda Walker  WUJ:[id number]  DOB: 11-01-1955   DOA: 10/31/2017  Referring Physician: Carron Curie, MD  HPI: Glenda Walker is a 62 y.o. female seen for follow up of Acute on Chronic Respiratory Failure.  Patient is on T collar has been tolerating PMV fairly well.  Should be able to change her trach out today  Medications: Reviewed on Rounds  Physical Exam:  Vitals: Temperature 97.8 pulse 100 respiratory rate 18 blood pressure 149/83 saturations 100%  Ventilator Settings on T collar FiO2 28% with the PMV  . General: Comfortable at this time . Eyes: Grossly normal lids, irises & conjunctiva . ENT: grossly tongue is normal . Neck: no obvious mass . Cardiovascular: S1 S2 normal no gallop . Respiratory: No rhonchi no rales are noted . Abdomen: soft . Skin: no rash seen on limited exam . Musculoskeletal: not rigid . Psychiatric:unable to assess . Neurologic: no seizure no involuntary movements         Lab Data:   Basic Metabolic Panel: Recent Labs  Lab 12/03/17 0701 12/04/17 0619 12/05/17 0700 12/07/17 0631  NA 137 138 136 136  K 3.0* 3.4* 3.8 3.9  CL 94* 94* 93* 95*  CO2 35* 31 32 32  GLUCOSE 89 111* 110* 105*  BUN 24* 29* 34* 34*  CREATININE 1.04* 1.12* 1.22* 1.08*  CALCIUM 9.6 9.9 10.1 10.2  MG 1.8 1.6* 2.0  --   PHOS 3.1  --  2.5  --     ABG: No results for input(s): PHART, PCO2ART, PO2ART, HCO3, O2SAT in the last 168 hours.  Liver Function Tests: Recent Labs  Lab 12/03/17 0701 12/05/17 0700  ALBUMIN 2.2* 2.3*   No results for input(s): LIPASE, AMYLASE in the last 168 hours. No results for input(s): AMMONIA in the last 168 hours.  CBC: Recent Labs  Lab 12/03/17 0701 12/05/17 0700  WBC 5.6 7.4  HGB 8.8* 8.5*  HCT 28.5* 27.3*  MCV 92.5 91.6  PLT 256 229    Cardiac Enzymes: No results for  input(s): CKTOTAL, CKMB, CKMBINDEX, TROPONINI in the last 168 hours.  BNP (last 3 results) Recent Labs    12/03/17 0701  BNP 727.4*    ProBNP (last 3 results) No results for input(s): PROBNP in the last 8760 hours.  Radiological Exams: No results found.  Assessment/Plan Principal Problem:   Acute on chronic respiratory failure with hypoxia (HCC) Active Problems:   Chronic respiratory failure with hypoxia (HCC)   Acute on chronic systolic CHF (congestive heart failure) (HCC)   Acute metabolic encephalopathy   Tracheostomy status (HCC)   Chronic atrial fibrillation (HCC)   Pleural effusion, right   Major depressive disorder, recurrent episode, mild (HCC)   1. Acute on chronic respiratory failure with hypoxia we will continue with the weaning I want to change the trach out to a #6 cuffless trach at this point 2. Chronic systolic heart failure compensated continue to monitor fluid status 3. Chronic right pleural effusion has drainage tube in place 4. Tracheostomy changed out today as above 5. Chronic atrial fibrillation rate is controlled 6. Metabolic encephalopathy cleared she is awake alert   I have personally seen and evaluated the patient, evaluated laboratory and imaging results, formulated the assessment and plan and placed orders. The Patient requires high complexity decision making for assessment and support.  Case was discussed on Rounds with  the Respiratory Therapy Staff  Allyne Gee, MD [facility] Pulmonary Critical Care Medicine Sleep Medicine

## 2017-12-09 DIAGNOSIS — J9621 Acute and chronic respiratory failure with hypoxia: Secondary | ICD-10-CM | POA: Diagnosis not present

## 2017-12-09 DIAGNOSIS — I482 Chronic atrial fibrillation: Secondary | ICD-10-CM | POA: Diagnosis not present

## 2017-12-09 DIAGNOSIS — I5023 Acute on chronic systolic (congestive) heart failure: Secondary | ICD-10-CM | POA: Diagnosis not present

## 2017-12-09 DIAGNOSIS — G9341 Metabolic encephalopathy: Secondary | ICD-10-CM | POA: Diagnosis not present

## 2017-12-09 LAB — BASIC METABOLIC PANEL
Anion gap: 10 (ref 5–15)
BUN: 29 mg/dL — ABNORMAL HIGH (ref 8–23)
CO2: 32 mmol/L (ref 22–32)
Calcium: 10.7 mg/dL — ABNORMAL HIGH (ref 8.9–10.3)
Chloride: 94 mmol/L — ABNORMAL LOW (ref 98–111)
Creatinine, Ser: 1.06 mg/dL — ABNORMAL HIGH (ref 0.44–1.00)
GFR, EST NON AFRICAN AMERICAN: 55 mL/min — AB (ref >60)
Glucose, Bld: 103 mg/dL — ABNORMAL HIGH (ref 70–99)
Potassium: 3.7 mmol/L (ref 3.5–5.1)
SODIUM: 136 mmol/L (ref 135–145)

## 2017-12-09 LAB — CBC
HCT: 28.2 % — ABNORMAL LOW (ref 36.0–46.0)
Hemoglobin: 8.5 g/dL — ABNORMAL LOW (ref 12.0–15.0)
MCH: 28.2 pg (ref 26.0–34.0)
MCHC: 30.1 g/dL (ref 30.0–36.0)
MCV: 93.7 fL (ref 78.0–100.0)
PLATELETS: 242 10*3/uL (ref 150–400)
RBC: 3.01 MIL/uL — AB (ref 3.87–5.11)
RDW: 19.2 % — ABNORMAL HIGH (ref 11.5–15.5)
WBC: 6.1 10*3/uL (ref 4.0–10.5)

## 2017-12-09 LAB — MAGNESIUM: MAGNESIUM: 1.6 mg/dL — AB (ref 1.7–2.4)

## 2017-12-09 NOTE — Progress Notes (Signed)
Pulmonary Critical Care Medicine [facility] [place]   PULMONARY CRITICAL CARE SERVICE  PROGRESS NOTE  Date of Service: 12/09/2017  Glenda Walker  ZOX:[id number]  DOB: 11/28/1955   DOA: 10/31/2017  Referring Physician: Carron Curie, MD  HPI: Glenda Walker is a 62 y.o. female seen for follow up of Acute on Chronic Respiratory Failure.  She is doing well has been on PMV as well as T collar looks good has been on 28% oxygen  Medications: Reviewed on Rounds  Physical Exam:  Vitals: Temperature 97.7 pulse 104 respiratory rate 18 blood pressure 134/77 saturations 96%  Ventilator Settings off the ventilator on T collar right now  . General: Comfortable at this time . Eyes: Grossly normal lids, irises & conjunctiva . ENT: grossly tongue is normal . Neck: no obvious mass . Cardiovascular: S1 S2 normal no gallop . Respiratory: No rhonchi or rails . Abdomen: soft . Skin: no rash seen on limited exam . Musculoskeletal: not rigid . Psychiatric:unable to assess . Neurologic: no seizure no involuntary movements         Lab Data:   Basic Metabolic Panel: Recent Labs  Lab 12/03/17 0701 12/04/17 0619 12/05/17 0700 12/07/17 0631 12/09/17 0647  NA 137 138 136 136 136  K 3.0* 3.4* 3.8 3.9 3.7  CL 94* 94* 93* 95* 94*  CO2 35* 31 32 32 32  GLUCOSE 89 111* 110* 105* 103*  BUN 24* 29* 34* 34* 29*  CREATININE 1.04* 1.12* 1.22* 1.08* 1.06*  CALCIUM 9.6 9.9 10.1 10.2 10.7*  MG 1.8 1.6* 2.0  --  1.6*  PHOS 3.1  --  2.5  --   --     ABG: No results for input(s): PHART, PCO2ART, PO2ART, HCO3, O2SAT in the last 168 hours.  Liver Function Tests: Recent Labs  Lab 12/03/17 0701 12/05/17 0700  ALBUMIN 2.2* 2.3*   No results for input(s): LIPASE, AMYLASE in the last 168 hours. No results for input(s): AMMONIA in the last 168 hours.  CBC: Recent Labs  Lab 12/03/17 0701 12/05/17 0700 12/09/17 0647  WBC 5.6 7.4 6.1  HGB 8.8* 8.5* 8.5*  HCT 28.5* 27.3* 28.2*  MCV  92.5 91.6 93.7  PLT 256 229 242    Cardiac Enzymes: No results for input(s): CKTOTAL, CKMB, CKMBINDEX, TROPONINI in the last 168 hours.  BNP (last 3 results) Recent Labs    12/03/17 0701  BNP 727.4*    ProBNP (last 3 results) No results for input(s): PROBNP in the last 8760 hours.  Radiological Exams: No results found.  Assessment/Plan Principal Problem:   Acute on chronic respiratory failure with hypoxia (HCC) Active Problems:   Chronic respiratory failure with hypoxia (HCC)   Acute on chronic systolic CHF (congestive heart failure) (HCC)   Acute metabolic encephalopathy   Tracheostomy status (HCC)   Chronic atrial fibrillation (HCC)   Pleural effusion, right   Major depressive disorder, recurrent episode, mild (HCC)   1. Acute on chronic respiratory failure with hypoxia we will continue to wean on T collar.  Continue with pulmonary toilet supportive care 2. Acute on chronic systolic heart failure we will continue with fluid management.  Diuresis as tolerated. 3. Metabolic encephalopathy grossly unchanged we will monitor 4. Chronic atrial fibrillation rate is controlled 5. Pleural effusion status post drainage 6. Depression at baseline   I have personally seen and evaluated the patient, evaluated laboratory and imaging results, formulated the assessment and plan and placed orders. The Patient requires high complexity decision making  for assessment and support.  Case was discussed on Rounds with the Respiratory Therapy Staff  Allyne Gee, MD [facility] Pulmonary Critical Care Medicine Sleep Medicine

## 2017-12-10 ENCOUNTER — Other Ambulatory Visit (HOSPITAL_COMMUNITY): Payer: Self-pay

## 2017-12-10 ENCOUNTER — Encounter: Payer: Self-pay | Admitting: Occupational Therapy

## 2017-12-10 DIAGNOSIS — I482 Chronic atrial fibrillation: Secondary | ICD-10-CM | POA: Diagnosis not present

## 2017-12-10 DIAGNOSIS — J9621 Acute and chronic respiratory failure with hypoxia: Secondary | ICD-10-CM | POA: Diagnosis not present

## 2017-12-10 DIAGNOSIS — G9341 Metabolic encephalopathy: Secondary | ICD-10-CM | POA: Diagnosis not present

## 2017-12-10 DIAGNOSIS — I5023 Acute on chronic systolic (congestive) heart failure: Secondary | ICD-10-CM | POA: Diagnosis not present

## 2017-12-10 LAB — MAGNESIUM: Magnesium: 1.9 mg/dL (ref 1.7–2.4)

## 2017-12-10 NOTE — Progress Notes (Signed)
Pulmonary Critical Care Medicine [facility] [place]   PULMONARY CRITICAL CARE SERVICE  PROGRESS NOTE  Date of Service: 12/10/2017  Glenda Walker  ZOX:[id number]  DOB: June 24, 1955   DOA: 10/31/2017  Referring Physician: Carron Curie, MD  HPI: Glenda Walker is a 62 y.o. female seen for follow up of Acute on Chronic Respiratory Failure.  On T collar right now still having a little bit of bleeding from the Pleurx site  Medications: Reviewed on Rounds  Physical Exam:  Vitals: Temperature 97.3 pulse 102 respiratory rate 12 blood pressure 141/82 saturations 100%  Ventilator Settings on T collar FiO2 28%  . General: Comfortable at this time . Eyes: Grossly normal lids, irises & conjunctiva . ENT: grossly tongue is normal . Neck: no obvious mass . Cardiovascular: S1 S2 normal no gallop . Respiratory: No rhonchi or rales are noted at this time . Abdomen: soft . Skin: no rash seen on limited exam . Musculoskeletal: not rigid . Psychiatric:unable to assess . Neurologic: no seizure no involuntary movements         Lab Data:   Basic Metabolic Panel: Recent Labs  Lab 12/04/17 0619 12/05/17 0700 12/07/17 0631 12/09/17 0647 12/10/17 0641  NA 138 136 136 136  --   K 3.4* 3.8 3.9 3.7  --   CL 94* 93* 95* 94*  --   CO2 31 32 32 32  --   GLUCOSE 111* 110* 105* 103*  --   BUN 29* 34* 34* 29*  --   CREATININE 1.12* 1.22* 1.08* 1.06*  --   CALCIUM 9.9 10.1 10.2 10.7*  --   MG 1.6* 2.0  --  1.6* 1.9  PHOS  --  2.5  --   --   --     ABG: No results for input(s): PHART, PCO2ART, PO2ART, HCO3, O2SAT in the last 168 hours.  Liver Function Tests: Recent Labs  Lab 12/05/17 0700  ALBUMIN 2.3*   No results for input(s): LIPASE, AMYLASE in the last 168 hours. No results for input(s): AMMONIA in the last 168 hours.  CBC: Recent Labs  Lab 12/05/17 0700 12/09/17 0647  WBC 7.4 6.1  HGB 8.5* 8.5*  HCT 27.3* 28.2*  MCV 91.6 93.7  PLT 229 242    Cardiac  Enzymes: No results for input(s): CKTOTAL, CKMB, CKMBINDEX, TROPONINI in the last 168 hours.  BNP (last 3 results) Recent Labs    12/03/17 0701  BNP 727.4*    ProBNP (last 3 results) No results for input(s): PROBNP in the last 8760 hours.  Radiological Exams: Dg Chest Port 1 View  Result Date: 12/10/2017 CLINICAL DATA:  Pleural effusions EXAM: PORTABLE CHEST 1 VIEW COMPARISON:  12/02/2017 FINDINGS: Tracheostomy tube stable. Right PleurX stable. NG tube removed. Atrial appendage stable unchanged. Cardiomegaly stable. Vascular congestion without Kerley B lines to suggest edema stable. IMPRESSION: Stable vascular congestion. Electronically Signed   By: Jolaine Click M.D.   On: 12/10/2017 09:44    Assessment/Plan Principal Problem:   Acute on chronic respiratory failure with hypoxia (HCC) Active Problems:   Chronic respiratory failure with hypoxia (HCC)   Acute on chronic systolic CHF (congestive heart failure) (HCC)   Acute metabolic encephalopathy   Tracheostomy status (HCC)   Chronic atrial fibrillation (HCC)   Pleural effusion, right   Major depressive disorder, recurrent episode, mild (HCC)   1. Acute on chronic respiratory failure with hypoxia we will continue weaning on the T collar continue pulmonary toilet secretion management patient's overall prognosis  guarded. 2. Chronic respiratory failure with hypoxia continue present management. 3. Acute metabolic encephalopathy she is improved 4. Chronic atrial fibrillation rate is controlled we will continue to follow 5. Pleural effusion with Pleurx catheter in place 6. Depression she is doing better   I have personally seen and evaluated the patient, evaluated laboratory and imaging results, formulated the assessment and plan and placed orders. The Patient requires high complexity decision making for assessment and support.  Case was discussed on Rounds with the Respiratory Therapy Staff  Yevonne Pax, MD [facility] Pulmonary  Critical Care Medicine Sleep Medicine

## 2017-12-11 DIAGNOSIS — I5023 Acute on chronic systolic (congestive) heart failure: Secondary | ICD-10-CM | POA: Diagnosis not present

## 2017-12-11 DIAGNOSIS — J9621 Acute and chronic respiratory failure with hypoxia: Secondary | ICD-10-CM | POA: Diagnosis not present

## 2017-12-11 DIAGNOSIS — G9341 Metabolic encephalopathy: Secondary | ICD-10-CM | POA: Diagnosis not present

## 2017-12-11 DIAGNOSIS — I482 Chronic atrial fibrillation: Secondary | ICD-10-CM | POA: Diagnosis not present

## 2017-12-11 LAB — CBC
HEMATOCRIT: 27.4 % — AB (ref 36.0–46.0)
Hemoglobin: 8.5 g/dL — ABNORMAL LOW (ref 12.0–15.0)
MCH: 28.9 pg (ref 26.0–34.0)
MCHC: 31 g/dL (ref 30.0–36.0)
MCV: 93.2 fL (ref 78.0–100.0)
PLATELETS: 263 10*3/uL (ref 150–400)
RBC: 2.94 MIL/uL — ABNORMAL LOW (ref 3.87–5.11)
RDW: 19.5 % — AB (ref 11.5–15.5)
WBC: 7.4 10*3/uL (ref 4.0–10.5)

## 2017-12-11 NOTE — Progress Notes (Signed)
Pulmonary Critical Care Medicine [facility] [place]   PULMONARY CRITICAL CARE SERVICE  PROGRESS NOTE  Date of Service: 12/11/2017  Glenda Walker  ZOX:[id number]  DOB: 1955/05/24   DOA: 10/31/2017  Referring Physician: Carron Curie, MD  HPI: Glenda Walker is a 62 y.o. female seen for follow up of Acute on Chronic Respiratory Failure.  She looks good slated for discharge hopefully by Monday to rehab doing well on capping trials right now she is been on room air  Medications: Reviewed on Rounds  Physical Exam:  Vitals: Temperature 97.5 pulse 98 respiratory 22 blood pressure 119/75 saturations 98%  Ventilator Settings off the ventilator capping  . General: Comfortable at this time . Eyes: Grossly normal lids, irises & conjunctiva . ENT: grossly tongue is normal . Neck: no obvious mass . Cardiovascular: S1 S2 normal no gallop . Respiratory: No rhonchi no rales are noted at this time . Abdomen: soft . Skin: no rash seen on limited exam . Musculoskeletal: not rigid . Psychiatric:unable to assess . Neurologic: no seizure no involuntary movements         Lab Data:   Basic Metabolic Panel: Recent Labs  Lab 12/05/17 0700 12/07/17 0631 12/09/17 0647 12/10/17 0641  NA 136 136 136  --   K 3.8 3.9 3.7  --   CL 93* 95* 94*  --   CO2 32 32 32  --   GLUCOSE 110* 105* 103*  --   BUN 34* 34* 29*  --   CREATININE 1.22* 1.08* 1.06*  --   CALCIUM 10.1 10.2 10.7*  --   MG 2.0  --  1.6* 1.9  PHOS 2.5  --   --   --     ABG: No results for input(s): PHART, PCO2ART, PO2ART, HCO3, O2SAT in the last 168 hours.  Liver Function Tests: Recent Labs  Lab 12/05/17 0700  ALBUMIN 2.3*   No results for input(s): LIPASE, AMYLASE in the last 168 hours. No results for input(s): AMMONIA in the last 168 hours.  CBC: Recent Labs  Lab 12/05/17 0700 12/09/17 0647 12/11/17 0521  WBC 7.4 6.1 7.4  HGB 8.5* 8.5* 8.5*  HCT 27.3* 28.2* 27.4*  MCV 91.6 93.7 93.2  PLT 229 242  263    Cardiac Enzymes: No results for input(s): CKTOTAL, CKMB, CKMBINDEX, TROPONINI in the last 168 hours.  BNP (last 3 results) Recent Labs    12/03/17 0701  BNP 727.4*    ProBNP (last 3 results) No results for input(s): PROBNP in the last 8760 hours.  Radiological Exams: Dg Chest Port 1 View  Result Date: 12/10/2017 CLINICAL DATA:  Pleural effusions EXAM: PORTABLE CHEST 1 VIEW COMPARISON:  12/02/2017 FINDINGS: Tracheostomy tube stable. Right PleurX stable. NG tube removed. Atrial appendage stable unchanged. Cardiomegaly stable. Vascular congestion without Kerley B lines to suggest edema stable. IMPRESSION: Stable vascular congestion. Electronically Signed   By: Jolaine Click M.D.   On: 12/10/2017 09:44    Assessment/Plan Principal Problem:   Acute on chronic respiratory failure with hypoxia (HCC) Active Problems:   Chronic respiratory failure with hypoxia (HCC)   Acute on chronic systolic CHF (congestive heart failure) (HCC)   Acute metabolic encephalopathy   Tracheostomy status (HCC)   Chronic atrial fibrillation (HCC)   Pleural effusion, right   Major depressive disorder, recurrent episode, mild (HCC)   1. Acute on chronic respiratory failure with hypoxia she is doing well with capping hopefully we should be able to make it to decannulation.  Husband did state that she was supposed to have a sleep study done so I think at some point she should get that done after discharge. 2. Acute on chronic systolic heart failure right now is compensated monitor fluid status closely 3. Acute metabolic encephalopathy improved 4. Chronic atrial fibrillation rate is controlled 5. Right pleural effusion status post Pleurx placement 6. Depression stable improved   I have personally seen and evaluated the patient, evaluated laboratory and imaging results, formulated the assessment and plan and placed orders. The Patient requires high complexity decision making for assessment and support.   Case was discussed on Rounds with the Respiratory Therapy Staff  Yevonne Pax, MD [facility] Pulmonary Critical Care Medicine Sleep Medicine

## 2017-12-12 ENCOUNTER — Encounter: Payer: Self-pay | Admitting: Physical Medicine and Rehabilitation

## 2017-12-12 ENCOUNTER — Inpatient Hospital Stay (HOSPITAL_COMMUNITY)
Admission: RE | Admit: 2017-12-12 | Discharge: 2017-12-27 | DRG: 947 | Disposition: A | Discharge status: Home Health | Payer: Medicare Other | Source: Other Acute Inpatient Hospital | Attending: Physical Medicine & Rehabilitation | Admitting: Physical Medicine & Rehabilitation

## 2017-12-12 DIAGNOSIS — I482 Chronic atrial fibrillation, unspecified: Secondary | ICD-10-CM | POA: Diagnosis present

## 2017-12-12 DIAGNOSIS — K7460 Unspecified cirrhosis of liver: Secondary | ICD-10-CM

## 2017-12-12 DIAGNOSIS — I25810 Atherosclerosis of coronary artery bypass graft(s) without angina pectoris: Secondary | ICD-10-CM

## 2017-12-12 DIAGNOSIS — Z885 Allergy status to narcotic agent status: Secondary | ICD-10-CM | POA: Diagnosis not present

## 2017-12-12 DIAGNOSIS — I5022 Chronic systolic (congestive) heart failure: Secondary | ICD-10-CM

## 2017-12-12 DIAGNOSIS — Z09 Encounter for follow-up examination after completed treatment for conditions other than malignant neoplasm: Secondary | ICD-10-CM

## 2017-12-12 DIAGNOSIS — J9621 Acute and chronic respiratory failure with hypoxia: Secondary | ICD-10-CM | POA: Diagnosis present

## 2017-12-12 DIAGNOSIS — I6930 Unspecified sequelae of cerebral infarction: Secondary | ICD-10-CM

## 2017-12-12 DIAGNOSIS — E785 Hyperlipidemia, unspecified: Secondary | ICD-10-CM | POA: Diagnosis present

## 2017-12-12 DIAGNOSIS — K209 Esophagitis, unspecified: Secondary | ICD-10-CM | POA: Diagnosis present

## 2017-12-12 DIAGNOSIS — E871 Hypo-osmolality and hyponatremia: Secondary | ICD-10-CM | POA: Diagnosis not present

## 2017-12-12 DIAGNOSIS — Z7982 Long term (current) use of aspirin: Secondary | ICD-10-CM

## 2017-12-12 DIAGNOSIS — Z79890 Hormone replacement therapy (postmenopausal): Secondary | ICD-10-CM

## 2017-12-12 DIAGNOSIS — Z951 Presence of aortocoronary bypass graft: Secondary | ICD-10-CM

## 2017-12-12 DIAGNOSIS — K922 Gastrointestinal hemorrhage, unspecified: Secondary | ICD-10-CM

## 2017-12-12 DIAGNOSIS — D62 Acute posthemorrhagic anemia: Secondary | ICD-10-CM

## 2017-12-12 DIAGNOSIS — F339 Major depressive disorder, recurrent, unspecified: Secondary | ICD-10-CM

## 2017-12-12 DIAGNOSIS — N179 Acute kidney failure, unspecified: Secondary | ICD-10-CM | POA: Diagnosis not present

## 2017-12-12 DIAGNOSIS — G9341 Metabolic encephalopathy: Secondary | ICD-10-CM | POA: Diagnosis not present

## 2017-12-12 DIAGNOSIS — R7309 Other abnormal glucose: Secondary | ICD-10-CM

## 2017-12-12 DIAGNOSIS — I2510 Atherosclerotic heart disease of native coronary artery without angina pectoris: Secondary | ICD-10-CM | POA: Diagnosis present

## 2017-12-12 DIAGNOSIS — I69391 Dysphagia following cerebral infarction: Secondary | ICD-10-CM | POA: Diagnosis not present

## 2017-12-12 DIAGNOSIS — Z823 Family history of stroke: Secondary | ICD-10-CM | POA: Diagnosis not present

## 2017-12-12 DIAGNOSIS — Z818 Family history of other mental and behavioral disorders: Secondary | ICD-10-CM | POA: Diagnosis not present

## 2017-12-12 DIAGNOSIS — R5381 Other malaise: Secondary | ICD-10-CM | POA: Diagnosis present

## 2017-12-12 DIAGNOSIS — K5901 Slow transit constipation: Secondary | ICD-10-CM

## 2017-12-12 DIAGNOSIS — K7290 Hepatic failure, unspecified without coma: Secondary | ICD-10-CM | POA: Diagnosis present

## 2017-12-12 DIAGNOSIS — R400 Somnolence: Secondary | ICD-10-CM

## 2017-12-12 DIAGNOSIS — Z930 Tracheostomy status: Secondary | ICD-10-CM | POA: Diagnosis not present

## 2017-12-12 DIAGNOSIS — G4733 Obstructive sleep apnea (adult) (pediatric): Secondary | ICD-10-CM | POA: Diagnosis present

## 2017-12-12 DIAGNOSIS — I110 Hypertensive heart disease with heart failure: Secondary | ICD-10-CM | POA: Diagnosis present

## 2017-12-12 DIAGNOSIS — R1310 Dysphagia, unspecified: Secondary | ICD-10-CM

## 2017-12-12 DIAGNOSIS — Z952 Presence of prosthetic heart valve: Secondary | ICD-10-CM

## 2017-12-12 DIAGNOSIS — I5023 Acute on chronic systolic (congestive) heart failure: Secondary | ICD-10-CM | POA: Diagnosis not present

## 2017-12-12 DIAGNOSIS — J90 Pleural effusion, not elsewhere classified: Secondary | ICD-10-CM | POA: Diagnosis present

## 2017-12-12 DIAGNOSIS — E039 Hypothyroidism, unspecified: Secondary | ICD-10-CM | POA: Diagnosis present

## 2017-12-12 DIAGNOSIS — R609 Edema, unspecified: Secondary | ICD-10-CM | POA: Diagnosis not present

## 2017-12-12 DIAGNOSIS — Z7901 Long term (current) use of anticoagulants: Secondary | ICD-10-CM

## 2017-12-12 DIAGNOSIS — E119 Type 2 diabetes mellitus without complications: Secondary | ICD-10-CM

## 2017-12-12 DIAGNOSIS — J9601 Acute respiratory failure with hypoxia: Secondary | ICD-10-CM

## 2017-12-12 LAB — GLUCOSE, CAPILLARY
GLUCOSE-CAPILLARY: 118 mg/dL — AB (ref 70–99)
GLUCOSE-CAPILLARY: 173 mg/dL — AB (ref 70–99)

## 2017-12-12 MED ORDER — PAROXETINE HCL 20 MG PO TABS
40.0000 mg | ORAL_TABLET | Freq: Every day | ORAL | Status: DC
  Administered 2017-12-13 – 2017-12-21 (×9): 40 mg via ORAL
  Administered 2017-12-22 (×2): 20 mg via ORAL
  Administered 2017-12-23 – 2017-12-27 (×5): 40 mg via ORAL
  Filled 2017-12-12 (×13): qty 2
  Filled 2017-12-12: qty 1
  Filled 2017-12-12 (×3): qty 2

## 2017-12-12 MED ORDER — BISACODYL 10 MG RE SUPP: 10.0000 mg | Freq: Every day | RECTAL | Status: DC | PRN

## 2017-12-12 MED ORDER — PROCHLORPERAZINE EDISYLATE 10 MG/2ML IJ SOLN: 5.0000 mg | Freq: Four times a day (QID) | INTRAMUSCULAR | Status: DC | PRN

## 2017-12-12 MED ORDER — ACETAMINOPHEN 325 MG PO TABS
325.0000 mg | ORAL_TABLET | ORAL | Status: DC | PRN
  Administered 2017-12-14 – 2017-12-25 (×15): 650 mg via ORAL
  Filled 2017-12-12 (×15): qty 2

## 2017-12-12 MED ORDER — MELATONIN 3 MG PO TABS
3.0000 mg | ORAL_TABLET | Freq: Every day | ORAL | Status: DC
  Administered 2017-12-12 – 2017-12-26 (×15): 3 mg via ORAL
  Filled 2017-12-12 (×15): qty 1

## 2017-12-12 MED ORDER — FLEET ENEMA 7-19 GM/118ML RE ENEM: 1.0000 | ENEMA | Freq: Once | RECTAL | Status: DC | PRN

## 2017-12-12 MED ORDER — MODAFINIL 100 MG PO TABS
100.0000 mg | ORAL_TABLET | Freq: Every day | ORAL | Status: DC
  Administered 2017-12-13 – 2017-12-18 (×6): 100 mg via ORAL
  Filled 2017-12-12 (×7): qty 1

## 2017-12-12 MED ORDER — PANTOPRAZOLE SODIUM 40 MG PO PACK
40.0000 mg | PACK | Freq: Two times a day (BID) | ORAL | Status: DC
  Administered 2017-12-12 – 2017-12-20 (×16): 40 mg via ORAL
  Filled 2017-12-12 (×16): qty 20

## 2017-12-12 MED ORDER — METOPROLOL TARTRATE 12.5 MG HALF TABLET
12.5000 mg | ORAL_TABLET | Freq: Two times a day (BID) | ORAL | Status: DC
  Administered 2017-12-12 – 2017-12-27 (×28): 12.5 mg via ORAL
  Filled 2017-12-12 (×31): qty 1

## 2017-12-12 MED ORDER — SPIRONOLACTONE 25 MG PO TABS
25.0000 mg | ORAL_TABLET | Freq: Two times a day (BID) | ORAL | Status: DC
  Administered 2017-12-12 – 2017-12-27 (×29): 25 mg via ORAL
  Filled 2017-12-12 (×29): qty 1

## 2017-12-12 MED ORDER — DIPHENHYDRAMINE HCL 12.5 MG/5ML PO ELIX: 12.5000 mg | ORAL_SOLUTION | Freq: Four times a day (QID) | ORAL | Status: DC | PRN

## 2017-12-12 MED ORDER — PROCHLORPERAZINE 25 MG RE SUPP: 12.5000 mg | Freq: Four times a day (QID) | RECTAL | Status: DC | PRN

## 2017-12-12 MED ORDER — GUAIFENESIN-DM 100-10 MG/5ML PO SYRP: 5.0000 mL | ORAL_SOLUTION | Freq: Four times a day (QID) | ORAL | Status: DC | PRN

## 2017-12-12 MED ORDER — FUROSEMIDE 20 MG PO TABS
20.0000 mg | ORAL_TABLET | Freq: Every day | ORAL | Status: DC
  Administered 2017-12-13 – 2017-12-27 (×14): 20 mg via ORAL
  Filled 2017-12-12 (×16): qty 1

## 2017-12-12 MED ORDER — ASPIRIN EC 81 MG PO TBEC
81.0000 mg | DELAYED_RELEASE_TABLET | Freq: Every day | ORAL | Status: DC
  Administered 2017-12-13 – 2017-12-27 (×15): 81 mg via ORAL
  Filled 2017-12-12 (×15): qty 1

## 2017-12-12 MED ORDER — ALUM & MAG HYDROXIDE-SIMETH 200-200-20 MG/5ML PO SUSP: 30.0000 mL | ORAL | Status: DC | PRN

## 2017-12-12 MED ORDER — INSULIN ASPART 100 UNIT/ML ~~LOC~~ SOLN
0.0000 [IU] | Freq: Three times a day (TID) | SUBCUTANEOUS | Status: DC
  Administered 2017-12-13: 1 [IU] via SUBCUTANEOUS
  Administered 2017-12-13: 2 [IU] via SUBCUTANEOUS
  Administered 2017-12-14 – 2017-12-15 (×3): 1 [IU] via SUBCUTANEOUS
  Administered 2017-12-16: 2 [IU] via SUBCUTANEOUS
  Administered 2017-12-17 (×2): 1 [IU] via SUBCUTANEOUS
  Administered 2017-12-18: 2 [IU] via SUBCUTANEOUS
  Administered 2017-12-18 – 2017-12-22 (×5): 1 [IU] via SUBCUTANEOUS
  Administered 2017-12-24: 3 [IU] via SUBCUTANEOUS
  Administered 2017-12-24 – 2017-12-25 (×2): 1 [IU] via SUBCUTANEOUS
  Administered 2017-12-26: 2 [IU] via SUBCUTANEOUS
  Administered 2017-12-26: 1 [IU] via SUBCUTANEOUS

## 2017-12-12 MED ORDER — INSULIN ASPART 100 UNIT/ML ~~LOC~~ SOLN: 0.0000 [IU] | Freq: Every day | SUBCUTANEOUS | Status: DC

## 2017-12-12 MED ORDER — TRAZODONE HCL 50 MG PO TABS
25.0000 mg | ORAL_TABLET | Freq: Every evening | ORAL | Status: DC | PRN
  Administered 2017-12-13 – 2017-12-22 (×3): 50 mg via ORAL
  Filled 2017-12-12 (×3): qty 1

## 2017-12-12 MED ORDER — GLUCERNA SHAKE PO LIQD
237.0000 mL | Freq: Three times a day (TID) | ORAL | Status: DC
  Administered 2017-12-13 – 2017-12-27 (×30): 237 mL via ORAL

## 2017-12-12 MED ORDER — METOCLOPRAMIDE HCL 5 MG PO TABS
5.0000 mg | ORAL_TABLET | Freq: Three times a day (TID) | ORAL | Status: DC
  Administered 2017-12-12 – 2017-12-14 (×6): 5 mg via ORAL
  Filled 2017-12-12 (×6): qty 1

## 2017-12-12 MED ORDER — SODIUM CHLORIDE 0.9% FLUSH
10.0000 mL | Freq: Two times a day (BID) | INTRAVENOUS | Status: DC
  Administered 2017-12-14 – 2017-12-15 (×2): 10 mL
  Administered 2017-12-16 – 2017-12-18 (×3): 30 mL
  Administered 2017-12-19 – 2017-12-23 (×6): 10 mL
  Administered 2017-12-24: 30 mL
  Administered 2017-12-26: 10 mL

## 2017-12-12 MED ORDER — LEVOTHYROXINE SODIUM 75 MCG PO TABS
150.0000 ug | ORAL_TABLET | Freq: Every day | ORAL | Status: DC
  Administered 2017-12-13 – 2017-12-27 (×15): 150 ug via ORAL
  Filled 2017-12-12 (×17): qty 2

## 2017-12-12 MED ORDER — RIFAXIMIN 550 MG PO TABS
550.0000 mg | ORAL_TABLET | Freq: Two times a day (BID) | ORAL | Status: DC
  Administered 2017-12-12 – 2017-12-27 (×30): 550 mg via ORAL
  Filled 2017-12-12 (×30): qty 1

## 2017-12-12 MED ORDER — SODIUM CHLORIDE 0.9% FLUSH
10.0000 mL | INTRAVENOUS | Status: DC | PRN
  Administered 2017-12-14 – 2017-12-25 (×5): 10 mL
  Filled 2017-12-12 (×5): qty 40

## 2017-12-12 MED ORDER — POLYETHYLENE GLYCOL 3350 17 G PO PACK
17.0000 g | PACK | Freq: Two times a day (BID) | ORAL | Status: DC
  Administered 2017-12-12 – 2017-12-26 (×26): 17 g via ORAL
  Filled 2017-12-12 (×30): qty 1

## 2017-12-12 MED ORDER — PROCHLORPERAZINE MALEATE 5 MG PO TABS
5.0000 mg | ORAL_TABLET | Freq: Four times a day (QID) | ORAL | Status: DC | PRN
  Administered 2017-12-23: 10 mg via ORAL
  Filled 2017-12-12: qty 2

## 2017-12-12 MED ORDER — POTASSIUM CHLORIDE CRYS ER 10 MEQ PO TBCR
10.0000 meq | EXTENDED_RELEASE_TABLET | Freq: Two times a day (BID) | ORAL | Status: DC
  Administered 2017-12-12 – 2017-12-13 (×2): 10 meq via ORAL
  Filled 2017-12-12 (×3): qty 1

## 2017-12-12 MED ORDER — NON FORMULARY: 3.0000 mg | Freq: Every day | Status: DC

## 2017-12-12 NOTE — Progress Notes (Signed)
Patient and family arrived on rehab unit today,they were informed about patient safety plan and rehab process.

## 2017-12-12 NOTE — Progress Notes (Signed)
[facility] PMR Admission Coordinator Pre-Admission Assessment  Patient: Glenda Walker is an 62 y.o., female MRN: [id number] DOB: 05/12/1955 Height: _0  (167.6 cm) Weight: 73.6 kg  Insurance Information HMO:     PPO:      PCP:      IPA:      80/20:      OTHER:  PRIMARY: Medicare A and B      Policy#: [id number]     Subscriber: Patient CM Name:       Phone#:      Fax#:  Pre-Cert#:       Employer:  Benefits:  Phone #: Verified eligibility via [employer] on 12/12/17     Name: [employer] Eff. Date: Part A: 11/13/2001, Part B: 09/12/2017     Deduct: $1,364      Out of Pocket Max: NA      Life Max: NA CIR: Covered per medicare guidelines once yearly deductible is met. *According to [facility], pt is in their coinsurance days. As of 12/12/17, [facility] reports the pt has 18 coinsurance days left with a copay amoutn of $341/day while in inpatient rehab. After 18 days, the pt would have 60 lifetime reserve days with pt responsible for $682/day      SNF: days 1-20, 100%; days 21-100, 80% Outpatient: 80%     Co-Pay: 20% Home Health: 100%      Co-Pay:  DME: 80%     Co-Pay: 20% Providers:   Medicaid Application Date:       Case Manager:  Disability Application Date:       Case Worker:   Emergency Contact Information         Contact Information    Name Relation Home Work Mobile   Glenda Walker,Glenda Walker Spouse   [phone]      Current Medical History  Patient Admitting Diagnosis: Debility  History of Present Illness: The pt is a 62 YO female, who, prior to this recent admission, was independent with mobility and ADLs but was noticing some increasing confusion and lethargy. Pt has a past medical history of CHF, T2DM, hypothyroidism, hyperlipidemia, HTN, CAD, s/p CABG, CVA, and Afib. She was admitted to an outside hospital on 09/01/17 for possible cholecystitis and right pleural effusion. Pt was discharged 8 days later and treated with Zosyn and IV antibiotics. Pt returned to same outside hospital  approximately 1 month later with abdominal pain, hypotension, AMS, and respiratory failure. Pt was intubated and started on vasopressor; pt was also found to have an ileus. Pt eventually underwent tracheostomy, weaned off the ventilator, and began trach collar trials at 28%. While at [facility], pt has trach downsized to 6 cuffless, began capping trials, had PleurX catheter which is drained once daily, progressed to DYS 2 diet with thickened liquids, and began PT/OT to address debility and weakness. Pt was referred to CIR due to weakness and debility. Pt is to be admitted to CIR on 12/12/17.   Patient's medical record from [facility] has been reviewed by the rehabilitation admission coordinator and physician. NIH Stroke scale: Glascow Coma Scale:  Past Medical History      Past Medical History:  Diagnosis Date  . Acute metabolic encephalopathy 0/73/7106  . Acute on chronic respiratory failure with hypoxia ([place]) 11/01/2017  . Acute on chronic systolic CHF (congestive heart failure) ([place]) 11/01/2017  . Chronic atrial fibrillation ([place])   . Chronic respiratory failure with hypoxia ([place]) 11/01/2017  . Coronary artery disease   . Diabetes type 2, uncontrolled ([place])   .  Hyperlipidemia   . Hypertension   . Hypothyroidism   . Pleural effusion, right   . Stroke (cerebrum) ([place])   . Tracheostomy status ([place]) 11/01/2017    Family History   Family history is unknown by patient.  Prior Rehab/Hospitalizations Has the patient had major surgery during 100 days prior to admission? No               Current Medications SCHEDULED Accu-check Humalog sliding scale before meals and at bedtime Aspirin 81 Fish Oil  Lasix Synthroid  melatonin Reglan MiraLAX Provigil  Protonix Paxil ProStat Simethicone Spironolactone Xifaxan   Patients Current Diet:  Dys 2, nectar thick liquids  Precautions / Restrictions Precautions Precautions: Other (comment)(on ATC trach  collar weaning, using PMV, has PleurX catheter) Precautions/Special Needs: Swallowing(currently on DYS 2 diet, thin liquids) Precaution Comments: monitor SpO2 and HR Restrictions Weight Bearing Restrictions: No   Has the patient had 2 or more falls or a fall with injury in the past year?No  Prior Activity Level Limited Community (1-2x/wk): ever since June when compliations began, pt with limited community ambulation  Prior Functional Level Self Care: Did the patient need help bathing, dressing, using the toilet or eating?  Independent  Indoor Mobility: Did the patient need assistance with walking from room to room (with or without device)? Independent  Stairs: Did the patient need assistance with internal or external stairs (with or without device)? Independent  Functional Cognition: Did the patient need help planning regular tasks such as shopping or remembering to take medications? Needed some help  Home Assistive Devices / [place] Devices/Equipment: Bedside commode/3-in-1, Grab bars around toilet  Prior Device Use: Indicate devices/aids used by the patient prior to current illness, exacerbation or injury? None of the above   Prior Functional Level Current Functional Level  Bed Mobility  Independent  Min assist   Transfers  Independent  Mod assist   Mobility - Walk/Wheelchair  Independent  Total assist   Upper Body Dressing  Independent  Max assist   Lower Body Dressing  Independent  Max assist   Grooming  Independent  Mod assist   Eating/Drinking  Independent  Min assist   Toilet Transfer  Independent  Mod assist   Bladder Continence   continent  catheter in place   Bowel Management  continent  last BM: 12/06/17   Stair Climbing   Total assist   Communication  WNL  use of PMV   Memory  required assist for medication managemnet and other higher level cognitive activities, per  husband  delayed processing, confusion.   Cooking/Meal Prep  able to assist husband      Architect Management  Unknown    Driving       Special needs/care consideration BiPAP/CPAP: no CPM: no Continuous Drip IV: no Dialysis: no        Days: no Life Vest: no Oxygen: currently on cap trials  Special Bed: no Trach Size: yes, #6 cuffless Wound Vac (area):no      Location: no *Pt does have PleurX catheter with daily drainage Skin: trach, Right forearm skin tear, irritation along perineum/buttocks region.                        Bowel mgmt: Last BM: 12/06/17 Bladder mgmt:catheter in place Diabetic mgmt: yes  Previous Home Environment Living Arrangements: Spouse/significant other  Lives With: Spouse Available Help at Discharge: Family, Available 24 hours/day Type of Home:  House Home Layout: One level(with basement) Home Access: Ramped entrance Bathroom Shower/Tub: Tub/shower unit Bathroom Toilet: Handicapped height Bathroom Accessibility: Yes How Accessible: Accessible via walker, Accessible via wheelchair(husband addressing width of doorways for wc accessibility ) [place]: No(not PTA)  Discharge Living Setting Plans for Discharge Living Setting: Patient's home, Lives with (comment)(with husband) Type of Home at Discharge: House Discharge Home Layout: One level(with basement) Discharge Home Access: [place] entrance Discharge Bathroom Shower/Tub: Tub/shower unit Discharge Bathroom Toilet: Handicapped height Discharge Bathroom Accessibility: Yes How Accessible: Accessible via walker, Accessible via wheelchair(husband addressing width of doorways for wc accessibility ) Does the patient have any problems obtaining your medications?: Yes (Describe)(per husband, costly medications are avoided)  Social/Family/Support Systems Patient Roles: Spouse Contact Information: husband is emergency contact Anticipated Caregiver:  husband: Eulas Post Anticipated Ambulance person Information: Eulas Post: [phone] Ability/Limitations of Caregiver: 24/7 plus son lives close as well as 2 daughter-in-laws available to assist as needed Caregiver Availability: 24/7 Discharge Plan Discussed with Primary Caregiver: Yes Is Caregiver In Agreement with Plan?: Yes Does Caregiver/Family have Issues with Lodging/Transportation while Pt is in Rehab?: No  Goals/Additional Needs Patient/Family Goal for Rehab: Min A with transfers and ADLs. (PT: Min A, OT: Min A; SLP: Min A) Expected length of stay: 15-20 days Cultural Considerations: NA Dietary Needs: DYS 2 with thin liquids Equipment Needs: TBD; anticipate tub transfer bench, wc, RW Special Service Needs: trach care; capping trials initiated.  Pt/Family Agrees to Admission and willing to participate: Yes Program Orientation Provided & Reviewed with Pt/Caregiver Including Roles  & Responsibilities: Yes(reveiwed with pt and spouse)  Barriers to Discharge: Medical stability, Trach, New oxygen  Barriers to Discharge Comments: husband is very involved; modifying house to allow pt to return   Patient Condition: I have reviewed medical records from [facility], spoke with CM, RN, PT, patient and her husband. I met with the patient for onsite bedside assessment on 12/12/17 for inpatient rehabilitation assessment. Patient will benefit from ongoing PT,OT and SLP, can actively participate in 3 hours of therapy a day 5 days a week, and can make measurable gains during the admission. Patient will also benefit from the coordinated team approach during an Inpatient Acute Rehabilitation admission. The patient will receive intensive therapy as well as Rehabilitation Physician, Nursing, social worker, and care management interventions. Due to bowel and bladder management, safety, disease management, medical administration, pain management, trach care, and PleurX cath care the patient requires 24 hour a day  rehabilitation nursing. The patient is currently Mod A for transfers, unable to ambulate at this time, Min A for bed mobility, and Mod/Max A for ADLs. Discharge setting and therapy post discharge at home with home health and 24/7 Assist from family is anticipated. Patient has agreed to participate in the Acute Inpatient Rehabilitation Program and will admit today on 12/12/17.    Preadmission Screen Completed By:  Jhonnie Garner, 12/12/2017 3:11 PM ______________________________________________________________________   Discussed status with Dr. Posey Pronto on 12/12/17 at 3:30PM and received telephone approval for admission today.  Admission Coordinator:  Jhonnie Garner, time 3:30PM/Date 12/12/17   Assessment/Plan: Diagnosis: Debility 1. Does the need for close, 24 hr/day  Medical supervision in concert with the patient's rehab needs make it unreasonable for this patient to be served in a less intensive setting? Yes  2. Co-Morbidities requiring supervision/potential complications: CHF, W4YK, hypothyroidism, hyperlipidemia, HTN, CAD, s/p CABG, CVA, and Afib 3. Due to safety, skin/wound care, disease management, medication administration and patient education, does the patient require 24 hr/day rehab nursing?  Yes 4. Does the patient require coordinated care of a physician, rehab nurse, PT (1-2 hrs/day, 5 days/week), OT (1-2 hrs/day, 5 days/week) and SLP (1-2 hrs/day, 5 days/week) to address physical and functional deficits in the context of the above medical diagnosis(es)? Yes Addressing deficits in the following areas: balance, endurance, locomotion, strength, transferring, bathing, dressing, cognition, swallowing and psychosocial support 5. Can the patient actively participate in an intensive therapy program of at least 3 hrs of therapy 5 days a week? Yes, 15/7 6. The potential for patient to make measurable gains while on inpatient rehab is excellent 7. Anticipated functional outcomes upon discharge from  inpatients are: mod assist PT, mod assist OT, modified independent SLP 8. Estimated rehab length of stay to reach the above functional goals is: ~7 days. 9. Does the patient have adequate social supports to accommodate these discharge functional goals? Yes 10. Anticipated D/C setting: Home 11. Anticipated post D/C treatments: HH therapy and Home excercise program 12. Overall Rehab/Functional Prognosis: good    RECOMMENDATIONS: This patient's condition is appropriate for continued rehabilitative care in the following setting: CIR Patient has agreed to participate in recommended program. Yes Note that insurance prior authorization may be required for reimbursement for recommended care.   Delice Lesch, MD, ABPMR Jhonnie Garner 12/12/2017        Cosigned by: Jamse Arn, MD at 12/12/2017 4:41 PM  Revision History    Date/Time User Provider Type Action  12/12/2017 4:41 PM Jamse Arn, MD Physician Cosign  12/12/2017 4:17 PM Jamse Arn, MD Physician Sign  12/12/2017 3:46 PM Jhonnie Garner, [place] [facility] Admission Coordinator Share  View Details Report

## 2017-12-12 NOTE — Progress Notes (Addendum)
RT NOTES: Called by RN, patient admitted with trach from [facility]. Patient laying in bed with trach capped on room air. Resting comfortably with no s/s of distress noted. Patient noted to remain capped and on room air during the day and to remove trach cap and place on 28% aerosol trach collar at night. Will continue to monitor respiratory status.

## 2017-12-12 NOTE — PMR Pre-admission (Signed)
[facility] PMR Admission Coordinator Pre-Admission Assessment  Patient: Glenda Walker is an 62 y.o., female MRN: [id number] DOB: 09/26/1955 Height: _0  (167.6 cm) Weight: 73.6 kg  Insurance Information HMO:     PPO:      PCP:      IPA:      80/20:      OTHER:  PRIMARY: Medicare A and B      Policy#: [id number]     Subscriber: Patient CM Name:       Phone#:      Fax#:  Pre-Cert#:       Employer:  Benefits:  Phone #: Verified eligibility via [employer] on 12/12/17     Name: [employer] Eff. Date: Part A: 11/13/2001, Part B: 09/12/2017     Deduct: $1,364      Out of Pocket Max: NA      Life Max: NA CIR: Covered per medicare guidelines once yearly deductible is met. *According to [facility], pt is in their coinsurance days. As of 12/12/17, [facility] reports the pt has 18 coinsurance days left with a copay amoutn of $341/day while in inpatient rehab. After 18 days, the pt would have 60 lifetime reserve days with pt responsible for $682/day      SNF: days 1-20, 100%; days 21-100, 80% Outpatient: 80%     Co-Pay: 20% Home Health: 100%      Co-Pay:  DME: 80%     Co-Pay: 20% Providers:   Medicaid Application Date:       Case Manager:  Disability Application Date:       Case Worker:   Emergency Contact Information Contact Information    Name Relation Home Work Mobile   Klinger,glenn Spouse   [phone]      Current Medical History  Patient Admitting Diagnosis: Debility  History of Present Illness: The pt is a 62 YO female, who, prior to this recent admission, was independent with mobility and ADLs but was noticing some increasing confusion and lethargy. Pt has a past medical history of CHF, T2DM, hypothyroidism, hyperlipidemia, HTN, CAD, s/p CABG, CVA, and Afib. She was admitted to an outside hospital on 09/01/17 for possible cholecystitis and right pleural effusion. Pt was discharged 8 days later and treated with Zosyn and IV antibiotics. Pt returned to same outside hospital approximately 1  month later with abdominal pain, hypotension, AMS, and respiratory failure. Pt was intubated and started on vasopressor; pt was also found to have an ileus. Pt eventually underwent tracheostomy, weaned off the ventilator, and began trach collar trials at 28%. While at [facility], pt has trach downsized to 6 cuffless, began capping trials, had PleurX catheter which is drained once daily, progressed to DYS 2 diet with thickened liquids, and began PT/OT to address debility and weakness. Pt was referred to CIR due to weakness and debility. Pt is to be admitted to CIR on 12/12/17.   Patient's medical record from [facility] has been reviewed by the rehabilitation admission coordinator and physician. NIH Stroke scale: Glascow Coma Scale:  Past Medical History  Past Medical History:  Diagnosis Date  . Acute metabolic encephalopathy 3/41/9622  . Acute on chronic respiratory failure with hypoxia ([place]) 11/01/2017  . Acute on chronic systolic CHF (congestive heart failure) ([place]) 11/01/2017  . Chronic atrial fibrillation ([place])   . Chronic respiratory failure with hypoxia ([place]) 11/01/2017  . Coronary artery disease   . Diabetes type 2, uncontrolled ([place])   . Hyperlipidemia   . Hypertension   .  Hypothyroidism   . Pleural effusion, right   . Stroke (cerebrum) ([place])   . Tracheostomy status ([place]) 11/01/2017    Family History   Family history is unknown by patient.  Prior Rehab/Hospitalizations Has the patient had major surgery during 100 days prior to admission? No    Current Medications SCHEDULED Accu-check Humalog sliding scale before meals and at bedtime Aspirin 81 Fish Oil  Lasix Synthroid  melatonin Reglan MiraLAX Provigil  Protonix Paxil ProStat Simethicone Spironolactone Xifaxan   Patients Current Diet:  Dys 2, nectar thick liquids  Precautions / Restrictions Precautions Precautions: Other (comment)(on ATC trach collar weaning, using PMV, has PleurX  catheter) Precautions/Special Needs: Swallowing(currently on DYS 2 diet, thin liquids) Precaution Comments: monitor SpO2 and HR Restrictions Weight Bearing Restrictions: No   Has the patient had 2 or more falls or a fall with injury in the past year?No  Prior Activity Level Limited Community (1-2x/wk): ever since June when compliations began, pt with limited community ambulation  Prior Functional Level Self Care: Did the patient need help bathing, dressing, using the toilet or eating?  Independent  Indoor Mobility: Did the patient need assistance with walking from room to room (with or without device)? Independent  Stairs: Did the patient need assistance with internal or external stairs (with or without device)? Independent  Functional Cognition: Did the patient need help planning regular tasks such as shopping or remembering to take medications? Needed some help  Home Assistive Devices / [place] Devices/Equipment: Bedside commode/3-in-1, Grab bars around toilet  Prior Device Use: Indicate devices/aids used by the patient prior to current illness, exacerbation or injury? None of the above   Prior Functional Level Current Functional Level  Bed Mobility  Independent  Min assist   Transfers  Independent  Mod assist   Mobility - Walk/Wheelchair  Independent  Total assist   Upper Body Dressing  Independent  Max assist   Lower Body Dressing  Independent  Max assist   Grooming  Independent  Mod assist   Eating/Drinking  Independent  Min assist   Toilet Transfer  Independent  Mod assist   Bladder Continence   continent  catheter in place   Bowel Management  continent  last BM: 12/06/17   Stair Climbing     Total assist   Communication  WNL  use of PMV   Memory  required assist for medication managemnet and other higher level cognitive activities, per husband  delayed processing, confusion.   Cooking/Meal Prep  able to assist  husband      Architect Management  Unknown    Driving         Special needs/care consideration BiPAP/CPAP: no CPM: no Continuous Drip IV: no Dialysis: no        Days: no Life Vest: no Oxygen: currently on cap trials  Special Bed: no Trach Size: yes, #6 cuffless Wound Vac (area):no      Location: no *Pt does have PleurX catheter with daily drainage Skin: trach, Right forearm skin tear, irritation along perineum/buttocks region.                        Bowel mgmt: Last BM: 12/06/17 Bladder mgmt:catheter in place Diabetic mgmt: yes  Previous Home Environment Living Arrangements: Spouse/significant other  Lives With: Spouse Available Help at Discharge: Family, Available 24 hours/day Type of Home: House Home Layout: One level(with basement) Home Access: Ramped entrance Bathroom Shower/Tub: Tub/shower unit [employer]  Toilet: Handicapped height Bathroom Accessibility: Yes How Accessible: Accessible via walker, Accessible via wheelchair(husband addressing width of doorways for wc accessibility ) Home Care Services: No(not PTA)  Discharge Living Setting Plans for Discharge Living Setting: Patient's home, Lives with (comment)(with husband) Type of Home at Discharge: House Discharge Home Layout: One level(with basement) Discharge Home Access: [place] entrance Discharge Bathroom Shower/Tub: Tub/shower unit Discharge Bathroom Toilet: Handicapped height Discharge Bathroom Accessibility: Yes How Accessible: Accessible via walker, Accessible via wheelchair(husband addressing width of doorways for wc accessibility ) Does the patient have any problems obtaining your medications?: Yes (Describe)(per husband, costly medications are avoided)  Social/Family/Support Systems Patient Roles: Spouse Contact Information: husband is emergency contact Anticipated Caregiver: husband: Eulas Post Anticipated Caregiver's Contact Information: Eulas Post: [phone] Ability/Limitations  of Caregiver: 24/7 plus son lives close as well as 2 daughter-in-laws available to assist as needed Caregiver Availability: 24/7 Discharge Plan Discussed with Primary Caregiver: Yes Is Caregiver In Agreement with Plan?: Yes Does Caregiver/Family have Issues with Lodging/Transportation while Pt is in Rehab?: No  Goals/Additional Needs Patient/Family Goal for Rehab: Min A with transfers and ADLs. (PT: Min A, OT: Min A; SLP: Min A) Expected length of stay: 15-20 days Cultural Considerations: NA Dietary Needs: DYS 2 with thin liquids Equipment Needs: TBD; anticipate tub transfer bench, wc, RW Special Service Needs: trach care; capping trials initiated.  Pt/Family Agrees to Admission and willing to participate: Yes Program Orientation Provided & Reviewed with Pt/Caregiver Including Roles  & Responsibilities: Yes(reveiwed with pt and spouse)  Barriers to Discharge: Medical stability, Trach, New oxygen  Barriers to Discharge Comments: husband is very involved; modifying house to allow pt to return   Patient Condition: I have reviewed medical records from [facility], spoke with CM, RN, PT, patient and her husband. I met with the patient for onsite bedside assessment on 12/12/17 for inpatient rehabilitation assessment. Patient will benefit from ongoing PT,OT and SLP, can actively participate in 3 hours of therapy a day 5 days a week, and can make measurable gains during the admission. Patient will also benefit from the coordinated team approach during an Inpatient Acute Rehabilitation admission. The patient will receive intensive therapy as well as Rehabilitation Physician, Nursing, social worker, and care management interventions. Due to bowel and bladder management, safety, disease management, medical administration, pain management, trach care, and PleurX cath care the patient requires 24 hour a day rehabilitation nursing. The patient is currently Mod A for transfers, unable to ambulate at this time, Min A  for bed mobility, and Mod/Max A for ADLs. Discharge setting and therapy post discharge at home with home health and 24/7 Assist from family is anticipated. Patient has agreed to participate in the Acute Inpatient Rehabilitation Program and will admit today on 12/12/17.    Preadmission Screen Completed By:  Jhonnie Garner, 12/12/2017 3:11 PM ______________________________________________________________________   Discussed status with Dr. Posey Pronto on 12/12/17 at 3:30PM and received telephone approval for admission today.  Admission Coordinator:  Jhonnie Garner, time 3:30PM/Date 12/12/17   Assessment/Plan: Diagnosis: Debility 1. Does the need for close, 24 hr/day  Medical supervision in concert with the patient's rehab needs make it unreasonable for this patient to be served in a less intensive setting? Yes  2. Co-Morbidities requiring supervision/potential complications: CHF, D3UK, hypothyroidism, hyperlipidemia, HTN, CAD, s/p CABG, CVA, and Afib 3. Due to safety, skin/wound care, disease management, medication administration and patient education, does the patient require 24 hr/day rehab nursing? Yes 4. Does the patient require coordinated care of a physician, rehab nurse, PT (1-2  hrs/day, 5 days/week), OT (1-2 hrs/day, 5 days/week) and SLP (1-2 hrs/day, 5 days/week) to address physical and functional deficits in the context of the above medical diagnosis(es)? Yes Addressing deficits in the following areas: balance, endurance, locomotion, strength, transferring, bathing, dressing, cognition, swallowing and psychosocial support 5. Can the patient actively participate in an intensive therapy program of at least 3 hrs of therapy 5 days a week? Yes, 15/7 6. The potential for patient to make measurable gains while on inpatient rehab is excellent 7. Anticipated functional outcomes upon discharge from inpatients are: mod assist PT, mod assist OT, modified independent SLP 8. Estimated rehab length of stay to reach the  above functional goals is: ~7 days. 9. Does the patient have adequate social supports to accommodate these discharge functional goals? Yes 10. Anticipated D/C setting: Home 11. Anticipated post D/C treatments: HH therapy and Home excercise program 12. Overall Rehab/Functional Prognosis: good    RECOMMENDATIONS: This patient's condition is appropriate for continued rehabilitative care in the following setting: CIR Patient has agreed to participate in recommended program. Yes Note that insurance prior authorization may be required for reimbursement for recommended care.   Delice Lesch, MD, [facility] Jhonnie Garner 12/12/2017

## 2017-12-12 NOTE — H&P (Addendum)
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 She was readmitted to [facility] from OSH on 7/29 with AMS, hypotension and respiratory failure requiring intubation. Hospital course significant for shock with severe heart failure--EF < 25%, PNA, acute renal failure, ileus with colonic pseudoobstruction requiring colonic decompression, hepatic encephalopathy with ammonia levels > 200, GIB, and difficulty with vent wean requiring tracheostomy on 10/26/17.  EP recommended ICD "once stable and rehabilatated". She required TPN and was started on trickle tube feeds with slow increase in rate. She was extubated to ATC and transferred to [facility] on 10/31/17 for vent wean.    Moderate ascites noted on CT abdomen and patient was not a candidate for PEG tube and fluid not felt to be amenable to paracentesis.  She had difficulty with vent wean and required right  thoracocentesis 8/30 and 9/09. Ultimately right tunneled  Pleurex catheter placed on by Dr. Ardelle Anton on 09/17 and she tolerated weaning to ATC.  MBS done and she was started on dysphagia 1, thins on 9/24 as was tolerating PMSV trials.  She was downsized to CFS #6 on 9/26 and has been tolerating plugging during the day. She did have some bleeding around pleurex site 9/27 therefore  SQ heparin placed on hold. Serial H/H stable. Electrolyte abnormalities being supplemented and NGT discontinued on 9/25. Diet has been advanced to dysphagia 2, thin liquids and intake variable. She is showing improvement in activity tolerance and has been making progress with therapy. CIR was recommended by therapy and chart evaluated by Rehab MD/CM.  Patient deemed to be good  candidate for intensive rehab program.    Review of Systems  Respiratory: Negative for shortness of breath.   Cardiovascular: Positive for palpitations.  Neurological: Positive for speech change and weakness. Negative for sensory change.  Psychological: Positive for depression. All other systems reviewed and are negative.  Past Medical History:  Diagnosis Date  . Acute metabolic encephalopathy 11/01/2017  . Acute on chronic respiratory failure with hypoxia ([facility]) 11/01/2017  . Acute on chronic systolic CHF (congestive heart failure) ([facility]) 11/01/2017  . Chronic atrial fibrillation ([facility])   . Chronic respiratory failure with hypoxia ([facility]) 11/01/2017  . Cirrhosis of liver with ascites ([facility])   . Coronary artery disease   . Diabetes type 2, uncontrolled ([facility])   . Hyperlipidemia   . Hypertension   . Hypothyroidism   . Pleural effusion, right   . Stroke (cerebrum) ([facility])   . 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 AORTIC VALVE REPLACEMENT (AVR)/CORONARY ARTERY BYPASS GRAFTING (CABG)    . CORONARY ARTERY BYPASS GRAFT    . IR PERC TUN PERIT CATH WO PORT S&I Judi Cong  11/29/2017  . TRACHEOSTOMY      Family History  Problem Relation Age of Onset  . Stroke Maternal Grandmother     Social History: Married.  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  She used to smoke about a pack a day and started weaning early this year when she started getting sick.  She has never used smokeless tobacco.  Husband reports  reports that she drank alcoho in the past. She reports that she has current or past drug history.    Allergies  Allergen Reactions  . Hydrocodone Nausea And Vomiting     No medications prior to admission.    Drug Regimen Review  Drug regimen was reviewed and remains appropriate with no significant issues identified  Home:     Functional History:    Functional Status:    Mobility:          ADL:    Cognition:       Blood pressure 107/75, pulse 88, resp. rate 13, SpO2 100 %. Physical Exam  Nursing note and vitals reviewed. Constitutional: She is oriented to person, place, and time. She appears well-developed.  Frail  HENT:  Head: Normocephalic and atraumatic.  Front two teeth with caries.   Eyes: EOM are normal. Right eye exhibits no discharge. Left eye exhibits no discharge.  Neck:  CTS # 6 with red plug.   Cardiovascular: An irregularly irregular rhythm present.  Heart rate 90-110 at rest per monitor.   Respiratory: Effort normal. No accessory muscle usage. She has decreased breath sounds.  Right lower lateral chest with Pleurex catheter in place with dry dressing.   GI: Distended. Bowel sounds are normal.  Musculoskeletal:  No edema or tenderness in extremities  Neurological: She is alert and oriented to person, place, and time.  Right inattention with left gaze preference.  Slumped to the left.  Able to move eyes to right field with cues.  Very delayed and slow speech.  Able to state that she was in hospital in [place] but thought that she was here due to breathing issues. Had problems STM and LTM recall.  She was able to follow basic motor commands.  Motor: Grossly 4-/5 throughout  Skin: Skin is warm and dry.  Psychiatric: Her affect is blunt. Her speech is delayed. She is slowed and withdrawn.    Results for orders placed or performed during the hospital encounter of 10/31/17 (from the past 48 hour(s))  CBC     Status: Abnormal   Collection Time: 12/11/17  5:21 AM  Result Value Ref Range   WBC 7.4 4.0 - 10.5 K/uL   RBC 2.94 (L) 3.87 - 5.11 MIL/uL   Hemoglobin 8.5 (L) 12.0 - 15.0 g/dL   HCT 75.6 (L) 43.3 - 29.5 %   MCV 93.2 78.0 - 100.0 fL   MCH 28.9 26.0 - 34.0 pg   MCHC 31.0 30.0 - 36.0 g/dL   RDW 18.8 (H) 41.6 - 60.6 %   Platelets 263 150 - 400 K/uL    Comment: Performed at [facility], [address],  [place], [place] [address]   No results found.     Medical Problem List and Plan: 1.  Limitation in self-care, endurance, dysphagia, and mobility secondary to debility. 2.  DVT Prophylaxis/Anticoagulation: Mechanical: Sequential compression devices, below knee Bilateral lower extremities. Check dopplers in am.  3. Pain Management: Tylenol prn 4. Mood: LCSW to follow for evaluation and support.  5. Neuropsych: This patient is not fully capable of making decisions on her own behalf. 6. Skin/Wound Care: Routine pressure relief measures.  Maintain adequate nutritional hydration status.  We will add supplements 3 times daily as intake is variable 7. Fluids/Electrolytes/Nutrition: Monitor I's and O's.  Check CMET in the a.m. 8.  Acute on chronic respiratory failure: Continue capping trials during the day.  Open to ATC at nights. Question history of OSA.  9.  Right pleural effusion: Right Pleurx drain placed 09/17 by radiology.  Drain Pleurx daily-monitor for any signs of bleeding.  10.  Chronic systolic CHF: Monitor weights daily for trends and watch for other signs of overload. On spironolactone, lasix and low dose ASA. No statin due to cirrhosis.  11.  Chronic A fib: Monitor heart rate twice daily--resume low dose BB due to tachycardia.  Was on Xarelto at home due to embolic stroke 12. CAD s/p CABG/ICM: Will need ICD in the future.  13. H/o R-MCA stroke: Has been off Xarelto since June per husband/records. Continue low dose ASA.   14. H/o depression:  Stable on paxil. Also on modafinil for activation.  15. GIB with hematochezia due to esophagitis: On PPI bid. 16. Cirrhosis of the liver/Hepatic encephalopathy: Last ammonia level 31. Will recheck in am. Continue Rifaximin (off lactulose) 17. T2DM: Hgb A1C- 5.5. Her intake variable therefore will monitor BS ac/hs and use SSI for now.  18. Constipation: No BM since 9/24 per nursing. Increase miralax to bid. May need to resume lactulose. 19. Code status:  Husband wants full code now that patient is getting better.   Post Admission Physician Evaluation: 1. Preadmission assessment reviewed and changes made below. 2. Functional deficits secondary  to debility. 3. Patient is admitted to receive collaborative, interdisciplinary care between the physiatrist, rehab nursing staff, and therapy team. 4. Patient's level of medical complexity and substantial therapy needs in context of that medical necessity cannot be provided at a lesser intensity of care such as a SNF. 5. Patient has experienced substantial functional loss from his/her baseline which was documented above under the "Functional History" and "Functional Status" headings.  Judging by the patient's diagnosis, physical exam, and functional history, the patient has potential for functional progress which will result in measurable gains while on inpatient rehab.  These gains will be of substantial and practical use upon discharge  in facilitating mobility and self-care at the household level. 6. Physiatrist will provide 24 hour management of medical needs as well as oversight of the therapy plan/treatment and provide guidance as appropriate regarding the interaction of the two. 7. 24 hour rehab nursing will assist with safety, skin/wound care, disease management, medication administration and patient education  and help integrate therapy concepts, techniques,education, etc. 8. PT will assess and treat for/with: Lower extremity strength, range of motion, stamina, balance, functional mobility, safety, adaptive techniques and equipment, wound care, coping skills, pain control, education. Goals are: Min/Mod A. 9. OT will assess and treat for/with: ADL's, functional mobility, safety, upper extremity strength, adaptive techniques and equipment, wound mgt, ego support, and community reintegration.   Goals are: Min/Mod A. Therapy may proceed with showering this patient. 10. SLP will assess and treat for/with:  speech, language, cognition.  Goals are: Supervision/Min A. 11. Case Management and Social Worker will assess and treat for psychological issues and discharge planning. 12. Team conference will be held weekly to assess progress toward goals and to determine barriers to discharge. 13. Patient will receive at least 3 hours of therapy per day at least 5 days per week. 14. ELOS: ~7-10 days.       15. Prognosis:  good  I have personally performed a face to face diagnostic evaluation, including, but not limited to relevant history and physical exam findings, of this patient and developed relevant assessment and plan.  Additionally, I have reviewed and concur with the physician assistant's documentation above.  The patient's status has not changed. The original post  admission physician evaluation remains appropriate, and any changes from the pre-admission screening or documentation from the acute chart are noted above.    Maryla Morrow, MD, ABPMR Glenda Cree, PA-C 12/12/2017

## 2017-12-12 NOTE — Progress Notes (Signed)
RT placed pt on ATC 28% for the night. Pt is resting comfortably at this time, will continue to monitor as needed.

## 2017-12-12 NOTE — Progress Notes (Signed)
Pulmonary Critical Care Medicine [facility]   PULMONARY CRITICAL CARE SERVICE  PROGRESS NOTE  Date of Service: 12/12/2017  Glenda Walker  ZOX:[id number]  DOB: [birth year]   DOA: 10/31/2017  Referring Physician: Carron Curie, MD  HPI: Glenda Walker is a 62 y.o. female seen for follow up of Acute on Chronic Respiratory Failure.  She is doing well scheduled for discharge has been capping is on room air I think she should be able to work towards decannulation  Medications: Reviewed on Rounds  Physical Exam:  Vitals: Temperature 97.1 pulse 118 respiratory rate 22 blood pressure 135/75 saturations 98%  Ventilator Settings off the ventilator capping  . General: Comfortable at this time . Eyes: Grossly normal lids, irises & conjunctiva . ENT: grossly tongue is normal . Neck: no obvious mass . Cardiovascular: S1 S2 normal no gallop . Respiratory: No rhonchi no rales . Abdomen: soft . Skin: no rash seen on limited exam . Musculoskeletal: not rigid . Psychiatric:unable to assess . Neurologic: no seizure no involuntary movements         Lab Data:   Basic Metabolic Panel: Recent Labs  Lab 12/07/17 0631 12/09/17 0647 12/10/17 0641  NA 136 136  --   K 3.9 3.7  --   CL 95* 94*  --   CO2 32 32  --   GLUCOSE 105* 103*  --   BUN 34* 29*  --   CREATININE 1.08* 1.06*  --   CALCIUM 10.2 10.7*  --   MG  --  1.6* 1.9    ABG: No results for input(s): PHART, PCO2ART, PO2ART, HCO3, O2SAT in the last 168 hours.  Liver Function Tests: No results for input(s): AST, ALT, ALKPHOS, BILITOT, PROT, ALBUMIN in the last 168 hours. No results for input(s): LIPASE, AMYLASE in the last 168 hours. No results for input(s): AMMONIA in the last 168 hours.  CBC: Recent Labs  Lab 12/09/17 0647 12/11/17 0521  WBC 6.1 7.4  HGB 8.5* 8.5*  HCT 28.2* 27.4*  MCV 93.7 93.2  PLT 242 263    Cardiac Enzymes: No results for input(s): CKTOTAL, CKMB, CKMBINDEX, TROPONINI in  the last 168 hours.  BNP (last 3 results) Recent Labs    12/03/17 0701  BNP 727.4*    ProBNP (last 3 results) No results for input(s): PROBNP in the last 8760 hours.  Radiological Exams: No results found.  Assessment/Plan Principal Problem:   Acute on chronic respiratory failure with hypoxia (HCC) Active Problems:   Chronic respiratory failure with hypoxia (HCC)   Acute on chronic systolic CHF (congestive heart failure) (HCC)   Acute metabolic encephalopathy   Tracheostomy status (HCC)   Chronic atrial fibrillation (HCC)   Pleural effusion, right   Major depressive disorder, recurrent episode, mild (HCC)   1. Acute on chronic respiratory failure with hypoxia we will continue with capping trials and work towards decannulation.  She is apparently slated for discharge today 2. Metabolic encephalopathy cleared 3. Chronic atrial fibrillation rate is controlled 4. Pleural effusion drainage catheter in place 5. Tracheostomy as above will be discharged but should continue to work towards weaning and decannulation.   I have personally seen and evaluated the patient, evaluated laboratory and imaging results, formulated the assessment and plan and placed orders. The Patient requires high complexity decision making for assessment and support.  Case was discussed on Rounds with the Respiratory Therapy Staff  Yevonne Pax, MD [facility] Pulmonary Critical Care Medicine Sleep Medicine

## 2017-12-12 NOTE — IPOC Note (Signed)
Overall Plan of Care [facility]) Patient Details Name: Glenda Walker MRN: [id number] DOB: 11-02-1955  Admitting Diagnosis: Debility  Hospital Problems: Active Problems:   Debility   Dysphagia   Acute respiratory failure with hypoxia (HCC)   Chronic systolic heart failure (HCC)   Coronary artery disease involving coronary bypass graft of native heart without angina pectoris   History of CVA with residual deficit   Episode of recurrent major depressive disorder (HCC)   Gastrointestinal hemorrhage   Cirrhosis of liver without ascites (HCC)   Diabetes mellitus type 2 in nonobese (HCC)   Slow transit constipation     Functional Problem List: Nursing Behavior, Bladder, Bowel, Medication Management, Nutrition, Perception, Safety, Skin Integrity  PT Balance, Endurance, Motor, Safety, Sensory  OT Balance, Cognition, Behavior, Endurance, Pain  SLP Cognition, Nutrition  TR         Basic ADL's: OT Grooming, Bathing, Dressing, Toileting     Advanced  ADL's: OT       Transfers: PT Bed Mobility, Bed to Chair, Car, Occupational psychologist, Research scientist (life sciences): PT Ambulation, Psychologist, prison and probation services, Stairs     Additional Impairments: OT    SLP Swallowing, Social Cognition   Problem Solving, Memory, Attention, Awareness  TR      Anticipated Outcomes Item Anticipated Outcome  Self Feeding independent  Swallowing  mod I   Basic self-care  supervision  Toileting  supervision   Bathroom Transfers supervision  Bowel/Bladder  min assist  Transfers  supervision  Locomotion  min A gait  Communication     Cognition  supervision  Pain  less<2  Safety/Judgment  min assist   Therapy Plan: PT Intensity: Minimum of 1-2 x/day ,45 to 90 minutes PT Frequency: 5 out of 7 days PT Duration Estimated Length of Stay: 14-18 days OT Intensity: Minimum of 1-2 x/day, 45 to 90 minutes OT Frequency: 5 out of 7 days OT Duration/Estimated Length of Stay: 14-16 days SLP Intensity:  Minumum of 1-2 x/day, 30 to 90 minutes SLP Frequency: 3 to 5 out of 7 days SLP Duration/Estimated Length of Stay: 15-20 days     Team Interventions: Nursing Interventions Patient/Family Education, Pain Management, Dysphagia/Aspiration Precaution Training, Bladder Management, Medication Management, Bowel Management, Skin Care/Wound Management, Disease Management/Prevention, Discharge Planning  PT interventions Ambulation/gait training, Discharge planning, Functional mobility training, Therapeutic Activities, Balance/vestibular training, Neuromuscular re-education, Therapeutic Exercise, Wheelchair propulsion/positioning, Cognitive remediation/compensation, DME/adaptive equipment instruction, Pain management, Splinting/orthotics, UE/LE Strength taining/ROM, Community reintegration, Development worker, international aid stimulation, Patient/family education, Museum/gallery curator, UE/LE Coordination activities  OT Interventions Warden/ranger, Cognitive remediation/compensation, Firefighter, Fish farm manager, Discharge planning, Pain management, Self Care/advanced ADL retraining, Therapeutic Activities, UE/LE Coordination activities, Therapeutic Exercise, Patient/family education, Functional mobility training, Neuromuscular re-education  SLP Interventions Cognitive remediation/compensation, Cueing hierarchy, Functional tasks, Environmental controls, Internal/external aids, Patient/family education, Dysphagia/aspiration precaution training  TR Interventions    SW/CM Interventions Discharge Planning, Psychosocial Support, Patient/Family Education   Barriers to Discharge MD  Medical stability and Trach  Nursing      PT [place]    OT      SLP      SW       Team Discharge Planning: Destination: PT-Home ,OT- Home , SLP-Home Projected Follow-up: PT-Home health PT, OT-  24 hour supervision/assistance, SLP-Home Health SLP, 24 hour supervision/assistance Projected Equipment Needs:  PT-To be determined, OT- 3 in 1 bedside comode, Tub/shower bench, SLP-None recommended by SLP Equipment Details: PT- , OT-  Patient/family involved in discharge planning: PT- Patient, Family  member/caregiver,  OT-Patient, Family member/caregiver, SLP-Patient, Family member/caregiver  MD ELOS:  7-10 days. 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 She was readmitted to [facility] from OSH on 7/29 with AMS, hypotension and respiratory failure requiring intubation. Hospital course significant for shock with severe heart failure--EF < 25%, PNA, acute renal failure, ileus with colonic pseudoobstruction requiring colonic decompression, hepatic encephalopathy with ammonia levels > 200, GIB, and difficulty with vent wean requiring tracheostomy on 10/26/17.  EP recommended ICD "once stable and rehabilatated". She required TPN and was started on trickle tube feeds with slow increase in rate. She was extubated to ATC and transferred to [facility] on 10/31/17 for vent wean.  Moderate ascites noted on CT abdomen and patient was not a candidate for PEG tube and fluid not felt to be amenable to paracentesis.  She had difficulty with vent wean and required right  thoracocentesis 8/30 and 9/09. Ultimately right tunneled  Pleurex catheter placed on by Dr. Ardelle Anton on 09/17 and she tolerated weaning to ATC.  MBS done and she was started on dysphagia 1, thins on 9/24 as was tolerating PMSV trials.  She was downsized to CFS #6 on 9/26 and has been tolerating plugging during the day. She did have some bleeding around pleurex site 9/27 therefore  SQ heparin placed on hold. Serial H/H stable. Electrolyte abnormalities being supplemented and NGT discontinued on 9/25. Diet has been  advanced to dysphagia 2, thin liquids and intake variable. She is showing improvement in activity tolerance and has been making progress with therapy. Patient with resulting functional deficits with mobility, dysphagia, self-care.  Will set goals for Min/Mod A with PT/OT.    See Team Conference Notes for weekly updates to the plan of care

## 2017-12-13 ENCOUNTER — Inpatient Hospital Stay (HOSPITAL_COMMUNITY): Payer: Self-pay

## 2017-12-13 ENCOUNTER — Inpatient Hospital Stay (HOSPITAL_COMMUNITY): Payer: Self-pay | Admitting: Occupational Therapy

## 2017-12-13 ENCOUNTER — Inpatient Hospital Stay (HOSPITAL_COMMUNITY): Payer: Self-pay | Admitting: Speech Pathology

## 2017-12-13 ENCOUNTER — Ambulatory Visit (HOSPITAL_COMMUNITY): Payer: Medicare Other | Attending: Physical Medicine and Rehabilitation

## 2017-12-13 ENCOUNTER — Inpatient Hospital Stay (HOSPITAL_COMMUNITY): Payer: Self-pay | Admitting: Physical Therapy

## 2017-12-13 DIAGNOSIS — I482 Chronic atrial fibrillation, unspecified: Secondary | ICD-10-CM

## 2017-12-13 DIAGNOSIS — R609 Edema, unspecified: Secondary | ICD-10-CM | POA: Diagnosis not present

## 2017-12-13 LAB — CBC WITH DIFFERENTIAL/PLATELET
Abs Immature Granulocytes: 0 10*3/uL (ref 0.0–0.1)
BASOS ABS: 0 10*3/uL (ref 0.0–0.1)
BASOS PCT: 0 %
EOS PCT: 5 %
Eosinophils Absolute: 0.5 10*3/uL (ref 0.0–0.7)
HCT: 29.1 % — ABNORMAL LOW (ref 36.0–46.0)
Hemoglobin: 9 g/dL — ABNORMAL LOW (ref 12.0–15.0)
Immature Granulocytes: 0 %
Lymphocytes Relative: 9 %
Lymphs Abs: 0.9 10*3/uL (ref 0.7–4.0)
MCH: 29.2 pg (ref 26.0–34.0)
MCHC: 30.9 g/dL (ref 30.0–36.0)
MCV: 94.5 fL (ref 78.0–100.0)
MONO ABS: 0.6 10*3/uL (ref 0.1–1.0)
MONOS PCT: 6 %
NEUTROS ABS: 7.3 10*3/uL (ref 1.7–7.7)
Neutrophils Relative %: 80 %
PLATELETS: 280 10*3/uL (ref 150–400)
RBC: 3.08 MIL/uL — ABNORMAL LOW (ref 3.87–5.11)
RDW: 19.8 % — AB (ref 11.5–15.5)
WBC: 9.2 10*3/uL (ref 4.0–10.5)

## 2017-12-13 LAB — GLUCOSE, CAPILLARY
GLUCOSE-CAPILLARY: 104 mg/dL — AB (ref 70–99)
GLUCOSE-CAPILLARY: 153 mg/dL — AB (ref 70–99)
GLUCOSE-CAPILLARY: 133 mg/dL — AB (ref 70–99)
Glucose-Capillary: 125 mg/dL — ABNORMAL HIGH (ref 70–99)

## 2017-12-13 LAB — COMPREHENSIVE METABOLIC PANEL
ALBUMIN: 2.8 g/dL — AB (ref 3.5–5.0)
ALT: 18 U/L (ref 0–44)
AST: 23 U/L (ref 15–41)
Alkaline Phosphatase: 180 U/L — ABNORMAL HIGH (ref 38–126)
Anion gap: 10 (ref 5–15)
BUN: 33 mg/dL — ABNORMAL HIGH (ref 8–23)
CALCIUM: 10.3 mg/dL (ref 8.9–10.3)
CO2: 25 mmol/L (ref 22–32)
Chloride: 94 mmol/L — ABNORMAL LOW (ref 98–111)
Creatinine, Ser: 1.31 mg/dL — ABNORMAL HIGH (ref 0.44–1.00)
GFR calc non Af Amer: 43 mL/min — ABNORMAL LOW (ref >60)
GFR, EST AFRICAN AMERICAN: 50 mL/min — AB (ref >60)
GLUCOSE: 109 mg/dL — AB (ref 70–99)
POTASSIUM: 4 mmol/L (ref 3.5–5.1)
SODIUM: 129 mmol/L — AB (ref 135–145)
TOTAL PROTEIN: 7.4 g/dL (ref 6.5–8.1)
Total Bilirubin: 0.6 mg/dL (ref 0.3–1.2)

## 2017-12-13 LAB — URINALYSIS, ROUTINE W REFLEX MICROSCOPIC
BILIRUBIN URINE: NEGATIVE
Glucose, UA: NEGATIVE mg/dL
Hgb urine dipstick: NEGATIVE
KETONES UR: NEGATIVE mg/dL
LEUKOCYTES UA: NEGATIVE
NITRITE: NEGATIVE
PH: 8 (ref 5.0–8.0)
Protein, ur: NEGATIVE mg/dL
SPECIFIC GRAVITY, URINE: 1.01 (ref 1.005–1.030)

## 2017-12-13 LAB — MAGNESIUM: Magnesium: 1.8 mg/dL (ref 1.7–2.4)

## 2017-12-13 LAB — AMMONIA: AMMONIA: 36 umol/L — AB (ref 9–35)

## 2017-12-13 MED ORDER — FLEET ENEMA 7-19 GM/118ML RE ENEM
1.0000 | ENEMA | Freq: Once | RECTAL | Status: DC
  Filled 2017-12-13: qty 1

## 2017-12-13 MED ORDER — POTASSIUM CHLORIDE 20 MEQ/15ML (10%) PO SOLN
10.0000 meq | Freq: Two times a day (BID) | ORAL | Status: DC
  Administered 2017-12-13 – 2017-12-16 (×6): 10 meq via ORAL
  Administered 2017-12-16: 11:00:00 via ORAL
  Administered 2017-12-17 – 2017-12-19 (×5): 10 meq via ORAL
  Filled 2017-12-13 (×12): qty 15

## 2017-12-13 MED ORDER — LIDOCAINE HCL URETHRAL/MUCOSAL 2 % EX GEL
1.0000 "application " | CUTANEOUS | Status: DC | PRN
  Filled 2017-12-13: qty 5

## 2017-12-13 MED ORDER — ENOXAPARIN SODIUM 40 MG/0.4ML ~~LOC~~ SOLN
40.0000 mg | SUBCUTANEOUS | Status: DC
  Administered 2017-12-13 – 2017-12-26 (×14): 40 mg via SUBCUTANEOUS
  Filled 2017-12-13 (×14): qty 0.4

## 2017-12-13 NOTE — Progress Notes (Signed)
RT placed pt on ATC 28% for the night. RN aware of needing extra trach in room and inner cannulas. Will continue to monitor as needed.

## 2017-12-13 NOTE — Plan of Care (Signed)
  Problem: Consults Goal: RH GENERAL PATIENT EDUCATION Description See Patient Education module for education specifics. Outcome: Progressing Goal: Skin Care Protocol Initiated - if Braden Score 18 or less Description If consults are not indicated, leave blank or document N/A Outcome: Progressing Goal: Nutrition Consult-if indicated Outcome: Progressing Goal: Diabetes Guidelines if Diabetic/Glucose > 140 Description If diabetic or lab glucose is > 140 mg/dl - Initiate Diabetes/Hyperglycemia Guidelines & Document Interventions  Outcome: Progressing   Problem: RH BOWEL ELIMINATION Goal: RH STG MANAGE BOWEL WITH ASSISTANCE Description STG Manage Bowel with Assistance. Outcome: Progressing Flowsheets (Taken 12/13/2017 1411) STG: Pt will manage bowels with assistance: 1-Total assistance Goal: RH STG MANAGE BOWEL W/MEDICATION W/ASSISTANCE Description STG Manage Bowel with Medication with Assistance. Outcome: Progressing Flowsheets (Taken 12/13/2017 1411) STG: Pt will manage bowels with medication with assistance: 1-Total assistance   Problem: RH BLADDER ELIMINATION Goal: RH STG MANAGE BLADDER WITH ASSISTANCE Description STG Manage Bladder With Assistance Outcome: Progressing Flowsheets (Taken 12/13/2017 1411) STG: Pt will manage bladder with assistance: 1-Total assistance Goal: RH STG MANAGE BLADDER WITH MEDICATION WITH ASSISTANCE Description STG Manage Bladder With Medication With Assistance. Outcome: Progressing Flowsheets (Taken 12/13/2017 1411) STG: Pt will manage bladder with medication with assistance: 2-Maximum assistance Goal: RH STG MANAGE BLADDER WITH EQUIPMENT WITH ASSISTANCE Description STG Manage Bladder With Equipment With Assistance Outcome: Progressing   Problem: RH SKIN INTEGRITY Goal: RH STG SKIN FREE OF INFECTION/BREAKDOWN Outcome: Progressing Goal: RH STG MAINTAIN SKIN INTEGRITY WITH ASSISTANCE Description STG Maintain Skin Integrity With  Assistance. Outcome: Progressing Flowsheets (Taken 12/13/2017 1411) STG: Maintain skin integrity with assistance: 2-Maximum assistance Goal: RH STG ABLE TO PERFORM INCISION/WOUND CARE W/ASSISTANCE Description STG Able To Perform Incision/Wound Care With Assistance. Outcome: Progressing Flowsheets (Taken 12/13/2017 1411) STG: Pt will be able to perform incision/wound care with assistance: 2-Maximum assistance   Problem: RH SAFETY Goal: RH STG ADHERE TO SAFETY PRECAUTIONS W/ASSISTANCE/DEVICE Description STG Adhere to Safety Precautions With Assistance/Device. Outcome: Progressing Flowsheets (Taken 12/13/2017 1411) STG:Pt will adhere to safety precautions with assistance/device: 2-Maximum assistance Goal: RH STG DECREASED RISK OF FALL WITH ASSISTANCE Description STG Decreased Risk of Fall With Assistance. Outcome: Progressing Flowsheets (Taken 12/13/2017 1411) [id number] risk of fall  with assistance/device: 2-Maximum assistance   Problem: RH PAIN MANAGEMENT Goal: RH STG PAIN MANAGED AT OR BELOW PT'S PAIN GOAL Outcome: Progressing   Problem: RH KNOWLEDGE DEFICIT GENERAL Goal: RH STG INCREASE KNOWLEDGE OF SELF CARE AFTER HOSPITALIZATION Outcome: Progressing   Problem: RH Vision Goal: RH LTG Vision (Specify) Outcome: Progressing   Problem: RH Pre-functional (Specify) Goal: RH LTG Pre-functional (Specify) Outcome: Progressing

## 2017-12-13 NOTE — Progress Notes (Signed)
*  Preliminary Results* Bilateral lower extremity venous duplex completed. Bilateral lower extremities are negative for deep vein thrombosis. There is no evidence of Baker's cyst bilaterally.  12/13/2017 3:55 PM Glenda Walker Clare Gandy

## 2017-12-13 NOTE — Progress Notes (Signed)
MEDICATION RELATED NOTE   Pharmacy Re:  Home meds  Medications:  Medications Prior to Admission  Medication Sig Dispense Refill Last Dose  . acetaminophen (TYLENOL) 325 MG tablet 650 mg every 6 (six) hours as needed for mild pain, moderate pain or fever.   unk  . ALPRAZolam (XANAX) 0.5 MG tablet Take 0.5 mg by mouth every 6 (six) hours as needed for anxiety.   unk  . Amino Acids-Protein Hydrolys (FEEDING SUPPLEMENT, PRO-STAT 64,) LIQD Take 30 mLs by mouth 3 (three) times daily.   12/12/2017 at Unknown time  . antiseptic oral rinse (BIOTENE) LIQD 15 mLs by Mouth Rinse route as needed for dry mouth.   unk  . aspirin EC 81 MG tablet 81 mg daily.    12/12/2017 at Unknown time  . dextrose 50 % solution Inject 11-25 mLs into the vein as needed for low blood sugar. Per sliding scale  22 give 25 ml 22-28 give 23ml 29-35 give 19 ml 36-41 give 17ml 42-48 give 15ml 49-55 give 13ml 56-61 give 11ml   unk  . Dextrose-Sodium Chloride (DEXTROSE 5 % AND 0.45% NACL) infusion Inject 1,000 mLs into the vein continuous as needed. Drip rate 50 ml/hr   unk  . furosemide (LASIX) 20 MG tablet 40 mg 2 (two) times daily.   12/12/2017 at Unknown time  . insulin lispro (HUMALOG) 100 UNIT/ML injection Inject 0-12 Units into the skin 4 (four) times daily -  before meals and at bedtime. Per sliding scale  70-150 = 0u 151-200=2u 201-250=4u 251-300=6u 301-350=8u 351-400=10u >400 give 12u, call MD and obtain stat lab verification.   unk  . levothyroxine (SYNTHROID, LEVOTHROID) 150 MCG tablet 150 mcg daily at 6 (six) AM.    12/12/2017 at Unknown time  . magnesium oxide (MAG-OX) 400 (241.3 Mg) MG tablet 400 mg every 6 (six) hours as needed. 1.6-1.7 400mg  q6h x 4 doses If mag is <1.5 meq see iv administration  Repeat level 8 hors after replacment    unk  . magnesium sulfate 1-5 GM/100ML-% INFUSION Inject 2 g into the vein as needed (for magnesium levels). If MG < 0.9 call MD 0.9-1.1 give 2g IV over 1hr x 3 doses 1.2-1.4  give 2g IV over 1hr x 2 doses 1.5-1.7 give 2g IV over 1hr x 1 dose  Repeat magnesium level every 8 hours after replacment.   unk  . Melatonin 3 MG TABS Place 3 mg into feeding tube at bedtime.    12/11/2017 at Unknown time  . metoCLOPramide (REGLAN) 5 MG tablet 5 mg every 6 (six) hours.    12/12/2017 at Unknown time  . Metoprolol Tartrate (METOPROLOL TARTARATE 1 MG/ML SYRINGE, ,) Inject 5 mg into the vein every 6 (six) hours as needed (high heart rate).   unk  . modafinil (PROVIGIL) 100 MG tablet Place 100 mg into feeding tube daily.    12/12/2017 at Unknown time  . morphine 2 MG/ML injection Inject 1 mg into the vein every 6 (six) hours as needed (pain).   unk  . Omega-3 Fatty Acids (THE VERY FINEST FISH OIL) LIQD Place 4,000 mg into feeding tube daily.    12/12/2017 at Unknown time  . ondansetron (ZOFRAN) 4 MG/2ML SOLN injection Inject 4 mg into the vein every 6 (six) hours as needed for nausea or vomiting.   unk  . pantoprazole sodium (PROTONIX) 40 mg/20 mL PACK 40 mg 2 (two) times daily.   12/12/2017 at Unknown time  . PARoxetine (PAXIL) 20 MG tablet  20 mg daily.    12/12/2017 at Unknown time  . polyethylene glycol (MIRALAX / GLYCOLAX) packet 17 g every other day.    12/11/2017 at Unknown time  . Potassium Bicarbonate (POTASSIUM EFFERVESCENT PO) 40 mEq every 4 (four) hours as needed.    unk  . potassium chloride 10 MEQ/100ML Inject 10 mEq into the vein as needed. Below 2.6 notify MD, SKG atat, IV q4h x 3 doses  2.7-3.0 IV q4h x 2 doses  3.1-3.4 IV q4h x 1 dose 3.5-5.1 no additional KCL   unk  . potassium chloride SA (K-DUR,KLOR-CON) 20 MEQ tablet Take 20 mEq by mouth every 4 (four) hours as needed. 3.1-3.4 po x 1 dose  2.7-3.0 q4h x2 doses  Below 2.6 see IV administration  11 unk  . rifaximin (XIFAXAN) 550 MG TABS tablet Take 550 mg by mouth 2 (two) times daily.   12/12/2017 at Unknown time  . simethicone (MYLICON) 80 MG chewable tablet Chew 80 mg by mouth 4 (four)  times daily.   12/12/2017 at Unknown time  . sodium polystyrene (KAYEXALATE) 15 GM/60ML suspension 15-30 g as needed (give 30g for k > 5.5 meq/L. notify MD). Give 15g for k 5.2-5.5 give 30g for k > 5.5 meq/L. notify MD   unk  . spironolactone (ALDACTONE) 25 MG tablet 25 mg 2 (two) times daily.    12/12/2017 at Unknown time  . heparin 5000 UNIT/ML injection Inject 5,000 Units into the skin every 8 (eight) hours. Rotate injection site   unk    Assessment: Patient is admitted from [facility] with the above listed medications.  We cannot determine dose administrations nor if any of these were home medications prior to admit.  Plan:  Will mark her record complete.  Please resume those medications you feel most appropriate for her care now and at discharge.  Nadara Mustard, PharmD., MS Clinical Pharmacist Pager:  [phone] Thank you for allowing pharmacy to be part of this patients care team. 12/13/2017,12:58 PM

## 2017-12-13 NOTE — Evaluation (Signed)
Physical Therapy Assessment and Plan  Patient Details  Name: Glenda Walker MRN: [id number] Date of Birth: 1955/05/06  PT Diagnosis: Abnormal posture, Cognitive deficits, Difficulty walking, Impaired cognition and Muscle weakness Rehab Potential: Good ELOS: 14-18 days   Today's Date: 12/13/2017 PT Individual Time: 0900-1010 PT Individual Time Calculation (min): 70 min    Problem List:  Patient Active Problem List   Diagnosis Date Noted  . Debility 12/12/2017  . Dysphagia   . Acute respiratory failure with hypoxia ([place])   . Chronic systolic heart failure ([place])   . Coronary artery disease involving coronary bypass graft of native heart without angina pectoris   . History of CVA with residual deficit   . Episode of recurrent major depressive disorder ([place])   . Gastrointestinal hemorrhage   . Cirrhosis of liver without ascites ([place])   . Diabetes mellitus type 2 in nonobese (HCC)   . Slow transit constipation   . Major depressive disorder, recurrent episode, mild ([place]) 11/25/2017  . Chronic respiratory failure with hypoxia ([place]) 11/01/2017  . Acute on chronic respiratory failure with hypoxia ([place]) 11/01/2017  . Acute on chronic systolic CHF (congestive heart failure) ([place]) 11/01/2017  . Acute metabolic encephalopathy 93/23/5573  . 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 Chronic atrial fibrillation   . Pleural effusion, right     Past Medical History:  Past Medical History:  Diagnosis Date  . Acute metabolic encephalopathy 05/04/2540  . Acute on chronic respiratory failure with hypoxia ([place]) 11/01/2017  . Acute on chronic systolic CHF (congestive heart failure) ([place]) 11/01/2017  . Chronic atrial fibrillation ([place])   . Chronic respiratory failure with hypoxia ([place]) 11/01/2017  . Cirrhosis of liver with ascites ([place])   . Coronary artery disease   . Diabetes type 2, uncontrolled ([place])   . Hyperlipidemia   . Hypertension   . Hypothyroidism   . Pleural effusion, right   . Stroke  (cerebrum) ([place])   . 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 AORTIC VALVE REPLACEMENT (AVR)/CORONARY ARTERY BYPASS GRAFTING (CABG)    . CORONARY ARTERY BYPASS GRAFT    . IR PERC TUN PERIT CATH WO PORT S&I [facility]  11/29/2017  . TRACHEOSTOMY      Assessment & Plan Clinical Impression: Patient is a 62 y.o. year old female with recent admission to the hospital on 7/29 with AMS, hypotension and respiratory failure requiring intubation. Hospital course significant for shock with severe heart failure--EF < 25%, PNA, acute renal failure, ileus with colonic pseudoobstruction requiring colonic decompression, hepatic encephalopathy with ammonia levels > 200, GIB, and difficulty with vent wean requiring tracheostomy on 10/26/17.  EP recommended ICD "once stable and rehabilatated". She required TPN and was started on trickle tube feeds with slow increase in rate. She was extubated to ATC and transferred to [facility] on 10/31/17 for vent wean.  Patient transferred to CIR on 12/12/2017 .   Patient currently requires max with mobility secondary to muscle weakness, decreased cardiorespiratoy endurance, decreased attention, decreased awareness, decreased problem solving, decreased safety awareness, decreased memory and delayed processing and decreased sitting balance, decreased standing balance, decreased postural control and decreased balance strategies.  Prior to hospitalization, patient was modified independent  with mobility and lived with Spouse in a House home.  Home access is 1Stairs to enter.  Patient will benefit from skilled PT intervention to maximize safe functional mobility, minimize fall risk and decrease caregiver burden for planned discharge home with  24 hour assist.  Anticipate patient will benefit from follow up [place] at discharge.  PT - End of Session Activity Tolerance: Tolerates 30+ min activity with multiple rests Endurance  Deficit: Yes PT Assessment Rehab Potential (ACUTE/IP ONLY): Good PT Barriers to Discharge: Trach PT Patient demonstrates impairments in the following area(s): Balance;Endurance;Motor;Safety;Sensory PT Transfers Functional Problem(s): Bed Mobility;Bed to Chair;Car;Furniture PT Locomotion Functional Problem(s): Ambulation;Wheelchair Mobility;Stairs PT Plan PT Intensity: Minimum of 1-2 x/day ,45 to 90 minutes PT Frequency: 5 out of 7 days PT Duration Estimated Length of Stay: 14-18 days PT Treatment/Interventions: Ambulation/gait training;Discharge planning;Functional mobility training;Therapeutic Activities;Balance/vestibular training;Neuromuscular re-education;Therapeutic Exercise;Wheelchair propulsion/positioning;Cognitive remediation/compensation;DME/adaptive equipment instruction;Pain management;Splinting/orthotics;UE/LE Strength taining/ROM;Community reintegration;Functional electrical stimulation;Patient/family education;Stair training;UE/LE Coordination activities PT Transfers Anticipated Outcome(s): supervision PT Locomotion Anticipated Outcome(s): min A gait PT Recommendation Follow Up Recommendations: Home health PT Patient destination: Home Equipment Recommended: To be determined  Skilled Therapeutic Intervention Pt participated in skilled PT eval and was educated on PT POC and goals. Pt min A with bed mobility with increased time for attention and initiation.  Pt performs sit <> stand with max A with RW, attempt gait but pt unable due to weakness and anxiety.  Pt performs squat pivot transfers throughout initially with +2 assist, progressing to max A with cues for wt shift forward and laterally. Standing in parallel bars with mod A with cues for posture and wt shifts, pt attempts to take steps in parallel bars but limited by knees buckling.  Pt left in w/c with husband present, alarm set.  PT Evaluation Precautions/Restrictions Precautions Precautions: Fall Precaution Comments:  trach Restrictions Weight Bearing Restrictions: No Pain Pain Assessment Pain Score: 0-No pain Home Living/Prior Functioning Home Living Available Help at Discharge: Family;Available 24 hours/day Type of Home: House Home Access: Stairs to enter [employer] of Steps: 1 Home Layout: One level Bathroom Shower/Tub: Research officer, trade [employer] Accessibility: Yes  Lives With: Spouse Prior Function Level of Independence: Independent with basic ADLs;Requires assistive device for independence;Independent with transfers  Able to Take Stairs?: Yes Driving: No Vocation: On disability  Cognition Attention: Sustained Sustained Attention: Impaired Sustained Attention Impairment: Verbal basic;Functional basic Safety/Judgment: Impaired Sensation Sensation Light Touch: Appears Intact Proprioception: Appears Intact Additional Comments: difficult to assess due to arousal and cognition Coordination Gross Motor Movements are Fluid and Coordinated: No Motor  Motor Motor: Abnormal postural alignment and control Motor - Skilled Clinical Observations: rt lean in sitting and standing, posterior pelvic tilt  Mobility Bed Mobility Bed Mobility: Supine to Sit Supine to Sit: Minimal Assistance - Patient > 75% Transfers Transfers: Sit to Stand;Stand to Sit;Squat Pivot Transfers Sit to Stand: Maximal Assistance - Patient 25-49% Stand to Sit: Maximal Assistance - Patient 25-49% Squat Pivot Transfers: Maximal Assistance - Patient 25-49% Transfer (Assistive device): None Locomotion  Gait Ambulation: No(unable) Wheelchair Mobility Wheelchair Mobility: Yes Wheelchair Assistance: Moderate Assistance - Patient 50 - 74% Wheelchair Propulsion: Both upper extremities Distance: 20  Trunk/Postural Assessment  Cervical Assessment Cervical Assessment: Within Functional Limits Thoracic Assessment Thoracic Assessment: (kyphosis) Lumbar Assessment Lumbar Assessment: (posterior pelvic  tilt) Postural Control Postural Control: Deficits on evaluation Righting Reactions: delayed and inadequate  Balance Dynamic Sitting Balance Sitting balance - Comments: min A Dynamic Standing Balance Dynamic Standing - Comments: total A Extremity Assessment      RLE Assessment General Strength Comments: grossly 3+/5 LLE Assessment General Strength Comments: grossly 3+/5    Refer to Care Plan for Long Term Goals  Recommendations for other services: Neuropsych  Discharge Criteria: Patient will be discharged from PT  if patient refuses treatment 3 consecutive times without medical reason, if treatment goals not met, if there is a change in medical status, if patient makes no progress towards goals or if patient is discharged from hospital.  The above assessment, treatment plan, treatment alternatives and goals were discussed and mutually agreed upon: by patient  Britaney Espaillat 12/13/2017, 10:23 AM

## 2017-12-13 NOTE — Evaluation (Signed)
Speech Language Pathology Assessment and Plan  Patient Details  Name: Glenda Walker MRN: [id number] Date of Birth: 03-Aug-1955  SLP Diagnosis: Cognitive Impairments;Dysphagia  Rehab Potential: Good ELOS: 15-20 days     Today's Date: 12/13/2017 SLP Individual Time: 0805-0900 SLP Individual Time Calculation (min): 55 min   Problem List:  Patient Active Problem List   Diagnosis Date Noted  . Debility 12/12/2017  . Dysphagia   . Acute respiratory failure with hypoxia ([place])   . Chronic systolic heart failure ([place])   . Coronary artery disease involving coronary bypass graft of native heart without angina pectoris   . History of CVA with residual deficit   . Episode of recurrent major depressive disorder ([place])   . Gastrointestinal hemorrhage   . Cirrhosis of liver without ascites ([place])   . Diabetes mellitus type 2 in nonobese (HCC)   . Slow transit constipation   . Major depressive disorder, recurrent episode, mild ([place]) 11/25/2017  . Chronic respiratory failure with hypoxia ([place]) 11/01/2017  . Acute on chronic respiratory failure with hypoxia ([place]) 11/01/2017  . Acute on chronic systolic CHF (congestive heart failure) ([place]) 11/01/2017  . Acute metabolic encephalopathy 12/87/8676  . 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 Chronic atrial fibrillation   . Pleural effusion, right    Past Medical History:  Past Medical History:  Diagnosis Date  . Acute metabolic encephalopathy 10/01/9468  . Acute on chronic respiratory failure with hypoxia ([place]) 11/01/2017  . Acute on chronic systolic CHF (congestive heart failure) ([place]) 11/01/2017  . Chronic atrial fibrillation ([place])   . Chronic respiratory failure with hypoxia ([place]) 11/01/2017  . Cirrhosis of liver with ascites ([place])   . Coronary artery disease   . Diabetes type 2, uncontrolled ([place])   . Hyperlipidemia   . Hypertension   . Hypothyroidism   . Pleural effusion, right   . Stroke (cerebrum) ([place])   . Tracheostomy status ([place])  11/01/2017   Past Surgical History:  Past Surgical History:  Procedure Laterality Date  . AORTIC VALVE REPLACEMENT (AVR)/CORONARY ARTERY BYPASS GRAFTING (CABG)    . CORONARY ARTERY BYPASS GRAFT    . IR PERC TUN PERIT CATH WO PORT S&I [facility]  11/29/2017  . 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 She was readmitted to [facility] from OSH on 7/29 with AMS, hypotension and respiratory failure requiring intubation. Hospital course significant for shock with severe heart failure--EF < 25%, PNA, acute renal failure, ileus with colonic pseudoobstruction requiring colonic decompression, hepatic encephalopathy with ammonia levels > 200, GIB, and difficulty with vent wean requiring tracheostomy on 10/26/17.  EP recommended ICD "once stable and rehabilatated". She required TPN and was started on trickle tube feeds with slow increase in rate. She was extubated to ATC and transferred to [facility] on 10/31/17 for vent wean.    Moderate ascites noted on CT abdomen and patient was not a candidate for PEG tube and fluid not felt to be amenable to paracentesis.  She had difficulty with vent wean and required right  thoracocentesis 8/30 and 9/09. Ultimately right tunneled  Pleurex catheter placed on by Dr. Jacqualyn Posey on 09/17 and she tolerated weaning to ATC.  MBS done and  she was started on dysphagia 1, thins on 9/24 as was tolerating PMSV trials.  She was downsized to CFS #6 on 9/26 and has been tolerating plugging during the day. She did have some bleeding around pleurex site 9/27 therefore  SQ heparin placed on hold. Serial H/H stable. Electrolyte abnormalities being supplemented and NGT discontinued on 9/25. Diet has  been advanced to dysphagia 2, thin liquids and intake variable. She is showing improvement in activity tolerance and has been making progress with therapy. CIR was recommended by therapy and chart evaluated by Rehab MD/CM.  Patient deemed to be good candidate for intensive rehab program.  SLP evaluation was completed on 12/13/2017 with the following results: Pt presents with capped trach and O2 sats WFL on room air, no s/s of discomfort or difficulty breathing.   Pt exhibits mild, generalized oral motor weakness which leads to prolonged mastication of solids and mild residuals post swallow which clear with increased time and encouragement to alternate between solids and liquids.  No overt s/s of aspiration were evident with solids or liquids.  For now, recommend that pt remain on her currently prescribed diet due to lethargy.  I suspect that pt will be able to be advanced quickly once her alertness improves.   Cognition was difficult to assess given pt's lethargy; however, pt did demonstrate slowed processing with increased response latency and needed cues to sustain her attention to basic tasks as she would begin to fall asleep before their completion.  As a result, pt currently needs min assist to complete tasks.  Further diagnostic treatment of cognition is warranted as mentation permits given that pt's husband reports that she was quite independent prior to admission.   Given the abovementioned deficits, pt would benefit from skilled ST while inpatient in order to maximize functional independence and reduce burden of care prior to discharge.  Anticipate that pt will need 24/7 supervision at discharge in addition to [place] follow up at next level of care.      Skilled Therapeutic Interventions          Cognitive-linguistic and bedside swallow evaluation completed with results and recommendations reviewed with patient and family.     SLP Assessment  Patient will need skilled Speech Lanaguage Pathology  Services during CIR admission    Recommendations  SLP Diet Recommendations: Dysphagia 2 (Fine chop);Thin Liquid Administration via: Cup;Straw Medication Administration: Whole meds with liquid Supervision: Patient able to self feed;Intermittent supervision to cue for compensatory strategies Compensations: Minimize environmental distractions;Slow rate;Small sips/bites Postural Changes and/or Swallow Maneuvers: Out of bed for meals;Seated upright 90 degrees;Upright 30-60 min after meal Oral Care Recommendations: Oral care BID Recommendations for Other Services: Neuropsych consult Patient destination: Home Follow up Recommendations: Home Health SLP;24 hour supervision/assistance Equipment Recommended: None recommended by SLP    SLP Frequency 3 to 5 out of 7 days   SLP Duration  SLP Intensity  SLP Treatment/Interventions 15-20 days   Minumum of 1-2 x/day, 30 to 90 minutes  Cognitive remediation/compensation;Cueing hierarchy;Functional tasks;Environmental controls;Internal/external aids;Patient/family education;Dysphagia/aspiration precaution training    Pain Pain Assessment Pain Scale: 0-10 Pain Score: 0-No pain  Prior Functioning Cognitive/Linguistic Baseline: Within functional limits Type of Home: House  Lives With: Spouse Available Help at Discharge: Family;Available 24 hours/day Vocation: On disability   Short Term Goals: Week 1: SLP Short Term Goal 1 (Week 1): Pt will consume therapeutic trials of dys 3 textures and thin liquids with mod I use of swallowing precautions and minimal overt s/s of aspiration prior  to diet advancement.  SLP Short Term Goal 2 (Week 1): Pt will sustain her attention to basic, familiar tasks for 5 minute intervals with min cues for redirection.    Refer to Care Plan for Long Term Goals  Recommendations for other services: Neuropsych  Discharge Criteria: Patient will be discharged from SLP if patient refuses treatment 3 consecutive times without  medical reason, if treatment goals not met, if there is a change in medical status, if patient makes no progress towards goals or if patient is discharged from hospital.  The above assessment, treatment plan, treatment alternatives and goals were discussed and mutually agreed upon: by patient and by family  Emilio Math 12/13/2017, 10:58 AM

## 2017-12-13 NOTE — Progress Notes (Signed)
Patient information reviewed and entered into eRehab system by Ethanjames Fontenot, RN, CRRN, PPS Coordinator.  Information including medical coding, functional ability and quality indicators will be reviewed and updated through discharge.     Per nursing patient was given "Data Collection Information Summary for Patients in Inpatient Rehabilitation Facilities with attached "Privacy Act Statement-Health Care Records" upon admission.  

## 2017-12-13 NOTE — Progress Notes (Signed)
[facility] PHYSICAL MEDICINE & REHABILITATION     PROGRESS NOTE  Subjective/Complaints:  Patient seen sitting up in bed this morning eating breakfast.  Husband at bedside feeding her.  She states she slept well overnight, confirmed with sleep chart.  They are ready to begin therapies.  ROS: Denies CP, S OB, nausea, vomiting, diarrhea.  Objective: Vital Signs: Blood pressure (!) 87/60, pulse 79, temperature 97.7 F (36.5 C), temperature source Oral, resp. rate 18, weight 68.2 kg, SpO2 97 %. No results found. Recent Labs    12/11/17 0521  WBC 7.4  HGB 8.5*  HCT 27.4*  PLT 263   No results for input(s): NA, K, CL, GLUCOSE, BUN, CREATININE, CALCIUM in the last 72 hours.  Invalid input(s): CO CBG (last 3)  Recent Labs    12/12/17 1934 12/12/17 2100 12/13/17 0640  GLUCAP 118* 173* 104*    Wt Readings from Last 3 Encounters:  12/13/17 68.2 kg  12/10/17 73.6 kg    Physical Exam:  BP (!) 87/60 (BP Location: Right Arm)   Pulse 79   Temp 97.7 F (36.5 C) (Oral)   Resp 18   Wt 68.2 kg   LMP  (LMP Unknown)   SpO2 97%   BMI 24.27 kg/m  Constitutional: She appears well-developed. Frail  HENT: Normocephalic and atraumatic.  Eyes: EOM are normal.  No discharge.  Neck: CTS # 6 with red plug.   Cardiovascular: Irregularly irregular.  No JVD. Respiratory: Effort normal. She has decreased breath sounds.  Right lower lateral chest with Pleurex catheter in place with dry dressing.   GI:  Mildly distended. Bowel sounds are normal.  Musculoskeletal: No edema or tenderness in extremities  Neurological: She is alert and oriented  Very delayed and slow speech.  She was able to follow basic motor commands.  Motor: Grossly 4/5 throughout  Skin: Skin is warm and dry.  Psychiatric: Her affect is blunt. Her speech is delayed. She is slowed and withdrawn.   Assessment/Plan: 1. Functional deficits secondary to debility which require 3+ hours per day of interdisciplinary therapy in a  comprehensive inpatient rehab setting. Physiatrist is providing close team supervision and 24 hour management of active medical problems listed below. Physiatrist and rehab team continue to assess barriers to discharge/monitor patient progress toward functional and medical goals.  Function:  Bathing Bathing position      Bathing parts      Bathing assist        Upper Body Dressing/Undressing Upper body dressing                    Upper body assist        Lower Body Dressing/Undressing Lower body dressing                                  Lower body assist        Toileting Toileting          Toileting assist     Transfers Chair/bed transfer             Locomotion Ambulation           Wheelchair          Cognition Comprehension    Expression    Social Interaction    Problem Solving    Memory      Medical Problem List and Plan: 1.  Limitation in self-care, endurance, dysphagia, and mobility  secondary to debility.  Begin CIR 2.  DVT Prophylaxis/Anticoagulation: Mechanical: Sequential compression devices, below knee Bilateral lower extremities.   Dopplers pending 3. Pain Management: Tylenol prn 4. Mood: LCSW to follow for evaluation and support.  5. Neuropsych: This patient is not fully capable of making decisions on her own behalf. 6. Skin/Wound Care: Routine pressure relief measures.  Maintain adequate nutritional hydration status.  Added supplements 3 times daily. 7. Fluids/Electrolytes/Nutrition: Monitor I's and O's.    Dysphagia 2 thins, advance diet as tolerated  Labs pending 8.  Acute on chronic respiratory failure: Continue capping trials during the day.  Open to ATC at nights. Question history of OSA.  9.  Right pleural effusion: Right Pleurx drain placed 09/17 by radiology.  Drain Pleurx daily-monitor for any signs of bleeding.  10.  Chronic systolic CHF: Monitor weights daily for trends and watch for other signs of  overload. On spironolactone, lasix and low dose ASA. No statin due to cirrhosis.    Filed Weights   12/13/17 0305  Weight: 68.2 kg  11.  Chronic A fib: Monitor heart rate twice daily--resumed low dose BB due to tachycardia.  Was on Xarelto at home due to embolic stroke  Will consider restarting  Monitor heart rate with increased mobility 12. CAD s/p CABG/ICM: Will need ICD in the future.  13. H/o R-MCA stroke: Has been off Xarelto since June per husband/records. Continue low dose ASA.    Will consider restarting 14. H/o depression:  Stable on paxil. Also on modafinil for activation.  15. GIB with hematochezia due to esophagitis: On PPI bid.  Hemoglobin 8.5 on 9/29.  Cont to monitor 16. Cirrhosis of the liver/Hepatic encephalopathy: Last ammonia level 31. Continue Rifaximin (off lactulose)  Labs pending 17. T2DM: Hgb A1C- 5.5. Her intake variable therefore will monitor BS ac/hs and use SSI for now.   Monitor with increased mobility 18. Constipation: No BM since 9/24 per nursing. Increased miralax to bid. May need to resume lactulose. 19. Code status: Husband wants full code now that patient is getting better.   LOS (Days) 1 A FACE TO FACE EVALUATION WAS PERFORMED  Gavriela Cashin Karis Juba 12/13/2017 8:02 AM

## 2017-12-13 NOTE — Evaluation (Signed)
Occupational Therapy Assessment and Plan  Patient Details  Name: Glenda Walker MRN: [id number] Date of Birth: [birth year]  OT Diagnosis: abnormal posture, altered mental status, cognitive deficits and muscle weakness (generalized) Rehab Potential: Rehab Potential (ACUTE ONLY): Excellent ELOS: 14-16 days   Today's Date: 12/13/2017 OT Individual Time: 1300-1416 OT Individual Time Calculation (min): 76 min     Problem List:  Patient Active Problem List   Diagnosis Date Noted  . Debility 12/12/2017  . Dysphagia   . Acute respiratory failure with hypoxia ([place])   . Chronic systolic heart failure ([place])   . Coronary artery disease involving coronary bypass graft of native heart without angina pectoris   . History of CVA with residual deficit   . Episode of recurrent major depressive disorder ([place])   . Gastrointestinal hemorrhage   . Cirrhosis of liver without ascites ([place])   . Diabetes mellitus type 2 in nonobese (HCC)   . Slow transit constipation   . Major depressive disorder, recurrent episode, mild ([place]) 11/25/2017  . Chronic respiratory failure with hypoxia ([place]) 11/01/2017  . Acute on chronic respiratory failure with hypoxia ([place]) 11/01/2017  . Acute on chronic systolic CHF (congestive heart failure) ([place]) 11/01/2017  . Acute metabolic encephalopathy 29/79/8921  . 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 Chronic atrial fibrillation   . Pleural effusion, right     Past Medical History:  Past Medical History:  Diagnosis Date  . Acute metabolic encephalopathy 1/94/1740  . Acute on chronic respiratory failure with hypoxia ([place]) 11/01/2017  . Acute on chronic systolic CHF (congestive heart failure) ([place]) 11/01/2017  . Chronic atrial fibrillation ([place])   . Chronic respiratory failure with hypoxia ([place]) 11/01/2017  . Cirrhosis of liver with ascites ([place])   . Coronary artery disease   . Diabetes type 2, uncontrolled ([place])   . Hyperlipidemia   . Hypertension   . Hypothyroidism    . Pleural effusion, right   . Stroke (cerebrum) ([place])   . 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 AORTIC VALVE REPLACEMENT (AVR)/CORONARY ARTERY BYPASS GRAFTING (CABG)    . CORONARY ARTERY BYPASS GRAFT    . IR PERC TUN PERIT CATH WO PORT S&I [facility]  11/29/2017  . TRACHEOSTOMY      Assessment & Plan Clinical Impression: Patient is a 62 y.o. year old female with recent admission to [facility] from OSH on 7/29 with AMS, hypotension and respiratory failure requiring intubation. Hospital course significant for shock with severe heart failure--EF < 25%, PNA, acute renal failure, ileus with colonic pseudoobstruction requiring colonic decompression, hepatic encephalopathy with ammonia levels > 200, GIB, and difficulty with vent wean requiring tracheostomy on 10/26/17.  EP recommended ICD "once stable and rehabilatated". She required TPN and was started on trickle tube feeds with slow increase in rate. She was extubated to ATC and transferred to [facility] on 10/31/17 for vent wean. .  Patient transferred to CIR on 12/12/2017 .    Patient currently requires max with basic self-care skills secondary to muscle weakness, decreased initiation, decreased attention, decreased awareness, decreased problem solving, decreased safety awareness, decreased memory and delayed processing and decreased sitting balance, decreased standing balance, decreased postural control and decreased balance strategies.  Prior to hospitalization, patient could complete ADLs with independent .  Patient will benefit from skilled intervention to decrease level of assist with basic self-care skills prior to discharge home with care partner.  Anticipate patient will require 24 hour supervision  and follow up home health.  OT - End of Session Activity Tolerance: Improving;Decreased this session Endurance Deficit: Yes OT Assessment Rehab Potential (ACUTE ONLY): Excellent OT  Patient demonstrates impairments in the following area(s): Balance;Cognition;Behavior;Endurance;Pain OT Basic ADL's Functional Problem(s): Grooming;Bathing;Dressing;Toileting OT Transfers Functional Problem(s): Toilet;Tub/Shower OT Plan OT Intensity: Minimum of 1-2 x/day, 45 to 90 minutes OT Frequency: 5 out of 7 days OT Duration/Estimated Length of Stay: 14-16 days OT Treatment/Interventions: Balance/vestibular training;Cognitive remediation/compensation;Community reintegration;DME/adaptive equipment instruction;Discharge planning;Pain management;Self Care/advanced ADL retraining;Therapeutic Activities;UE/LE Coordination activities;Therapeutic Exercise;Patient/family education;Functional mobility training;Neuromuscular re-education OT Self Feeding Anticipated Outcome(s): independent OT Basic Self-Care Anticipated Outcome(s): supervision OT Toileting Anticipated Outcome(s): supervision OT Bathroom Transfers Anticipated Outcome(s): supervision OT Recommendation Patient destination: Home Follow Up Recommendations: 24 hour supervision/assistance Equipment Recommended: 3 in 1 bedside comode;Tub/shower bench   Skilled Therapeutic Intervention Began education on selfcare retraining sit to stand at the EOB.  Pt with decreased sustained attention to task.  She needed max instructional cueing just to sequence through bathing task secondary to external distractions.  If therapist asked her a question about home or PLOF, she would stop bathing and then would need multiple cues to continue.  Abnormal pattern of washing as well secondary to distraction.  She would wash her face then her right leg, then after being distracted she would wash the right arm but then not the left.  Therapist had to continually limit conversation secondary to her limited ability to maintain her attention selectively.  Pt tearful at times as well stating she wanted to go home and see her grandchildren.  Therapist and pt's spouse were  able to re-direct her as she became frustrated at having to work on this task with a female therapist.  Educated pt that tomorrow I would have a female help her with this as she seemingly felt uncomfortable, and did not attempt removal of any clothing for bathing except for gripper socks when cued to do so.  Pt's spouse present throughout except at the end as he realized he was distracting to the pt and he left, so she would not keep focusing on him and wanting him to help her.  Mod assist for sit to stand from the EOB to get higher toward the [facility] to lay back down.  Increased bilateral knee flexion noted with standing.  Pt transferred to supine to complete session with min assist.  Educated pt to work on maintaining cervical extension to neutral for watching TV as in sitting she maintained flexed posture as well as left tilt.  Pt left with bed alarm in place and call button and phone in place.     OT Evaluation Precautions/Restrictions  Precautions Precautions: Fall Precaution Comments: trach Restrictions Weight Bearing Restrictions: No  Pain Pain Assessment Pain Score: 0-No pain Home Living/Prior Functioning Home Living Available Help at Discharge: Family, Available 24 hours/day Type of Home: House Home Access: Stairs to enter [employer] of Steps: 1 Home Layout: One level Bathroom Shower/Tub: Chiropodist: Handicapped height Bathroom Accessibility: Yes  Lives With: Spouse IADL History Homemaking Responsibilities: Yes Meal Prep Responsibility: Primary Laundry Responsibility: Primary Cleaning Responsibility: Primary Current License: No Mode of Transportation: Car Occupation: On disability Leisure and Hobbies: Likes to read and watch westerns. Prior Function Level of Independence: Independent with basic ADLs  Able to Take Stairs?: Yes Driving: No Vocation: On disability ADL ADL Eating: Set up Where Assessed-Eating: Bed level Grooming: Minimal  assistance Where Assessed-Grooming: Edge of bed Upper Body Bathing: Minimal assistance Where Assessed-Upper Body  Bathing: Edge of bed Lower Body Bathing: Maximal assistance Where Assessed-Lower Body Bathing: Edge of bed Upper Body Dressing: Minimal assistance Where Assessed-Upper Body Dressing: Edge of bed Lower Body Dressing: Maximal assistance Where Assessed-Lower Body Dressing: Edge of bed Toileting: Maximal assistance Vision Baseline Vision/History: Wears glasses Wears Glasses: At all times Patient Visual Report: No change from baseline Vision Assessment?: (To be further assessed in function) Perception  Perception: Within Functional Limits Praxis Praxis: Intact Cognition Overall Cognitive Status: Impaired/Different from baseline Arousal/Alertness: Lethargic Orientation Level: Person;Place;Situation Person: Oriented Place: Disoriented Situation: Disoriented Memory: Impaired Memory Impairment: Decreased recall of new information;Decreased short term memory Attention: Focused;Sustained Focused Attention: Impaired Focused Attention Impairment: Functional basic;Verbal basic Sustained Attention: Impaired Sustained Attention Impairment: Verbal basic;Functional basic Awareness: (difficult to assess due to lethargy) Problem Solving: Impaired Problem Solving Impairment: Functional basic Executive Function: (difficult to assess due to lethargy) Safety/Judgment: Impaired Comments: Pt needed frequent max instructional cueing for initiation and completion of simple ADL tasks such as removing her socks and washing her lower legs.   Sensation Sensation Light Touch: Appears Intact Hot/Cold: Appears Intact Proprioception: Appears Intact Coordination Gross Motor Movements are Fluid and Coordinated: No Fine Motor Movements are Fluid and Coordinated: Yes Coordination and Movement Description: Pt with slower movements and speed than normal secondary to weakness and attention. Motor   Motor Motor: Abnormal postural alignment and control Mobility  Bed Mobility Bed Mobility: Supine to Sit;Sit to Supine Supine to Sit: Minimal Assistance - Patient > 75% Sit to Supine: Minimal Assistance - Patient > 75% Transfers Sit to Stand: Moderate Assistance - Patient 50-74% Stand to Sit: Moderate Assistance - Patient 50-74%  Trunk/Postural Assessment  Cervical Assessment Cervical Assessment: Exceptions to WFL(forward flexion along with head tilt to the left in sitting) Thoracic Assessment Thoracic Assessment: Exceptions to WFL(thoracic rounding in sitting) Lumbar Assessment Lumbar Assessment: Exceptions to WFL(pt maintains posterior pelvic tilt in sitting) Postural Control Postural Control: Deficits on evaluation Head Control: Pt unable to maintain cervical extension to neutral and instead maintains cervical and trunk flexion.   Righting Reactions: delayed and inadequate  Balance Balance Balance Assessed: Yes Static Sitting Balance Static Sitting - Balance Support: Feet supported Static Sitting - Level of Assistance: 5: Stand by assistance Dynamic Sitting Balance Dynamic Sitting - Balance Support: Feet supported Dynamic Sitting - Level of Assistance: 4: Min assist Static Standing Balance Static Standing - Balance Support: During functional activity;Bilateral upper extremity supported Static Standing - Level of Assistance: 3: Mod assist Dynamic Standing Balance Dynamic Standing - Balance Support: During functional activity;Bilateral upper extremity supported Dynamic Standing - Level of Assistance: 2: Max assist Extremity/Trunk Assessment RUE Assessment RUE Assessment: Within Functional Limits Active Range of Motion (AROM) Comments: WFLS General Strength Comments: strength 3+/5 throughout LUE Assessment LUE Assessment: Within Functional Limits Active Range of Motion (AROM) Comments: WFLs General Strength Comments: strength 3+/5 throughout     Refer to Care Plan for  Long Term Goals  Recommendations for other services: Neuropsych   Discharge Criteria: Patient will be discharged from OT if patient refuses treatment 3 consecutive times without medical reason, if treatment goals not met, if there is a change in medical status, if patient makes no progress towards goals or if patient is discharged from hospital.  The above assessment, treatment plan, treatment alternatives and goals were discussed and mutually agreed upon: by patient and by family  Roselyn Doby OTR/L 12/13/2017, 5:03 PM

## 2017-12-14 ENCOUNTER — Encounter (HOSPITAL_COMMUNITY): Payer: Medicare Other | Admitting: Psychology

## 2017-12-14 ENCOUNTER — Inpatient Hospital Stay (HOSPITAL_COMMUNITY): Payer: Self-pay | Admitting: Speech Pathology

## 2017-12-14 ENCOUNTER — Inpatient Hospital Stay (HOSPITAL_COMMUNITY): Payer: Self-pay | Admitting: Occupational Therapy

## 2017-12-14 ENCOUNTER — Inpatient Hospital Stay (HOSPITAL_COMMUNITY): Payer: Self-pay | Admitting: Physical Therapy

## 2017-12-14 ENCOUNTER — Inpatient Hospital Stay (HOSPITAL_COMMUNITY): Payer: Medicare Other | Admitting: Physical Therapy

## 2017-12-14 DIAGNOSIS — N179 Acute kidney failure, unspecified: Secondary | ICD-10-CM

## 2017-12-14 DIAGNOSIS — E871 Hypo-osmolality and hyponatremia: Secondary | ICD-10-CM | POA: Insufficient documentation

## 2017-12-14 DIAGNOSIS — F331 Major depressive disorder, recurrent, moderate: Secondary | ICD-10-CM

## 2017-12-14 LAB — GLUCOSE, CAPILLARY
GLUCOSE-CAPILLARY: 123 mg/dL — AB (ref 70–99)
Glucose-Capillary: 108 mg/dL — ABNORMAL HIGH (ref 70–99)
Glucose-Capillary: 119 mg/dL — ABNORMAL HIGH (ref 70–99)
Glucose-Capillary: 145 mg/dL — ABNORMAL HIGH (ref 70–99)

## 2017-12-14 MED ORDER — GLUCERNA SHAKE PO LIQD: 237.0000 mL | Freq: Three times a day (TID) | ORAL | Status: DC

## 2017-12-14 MED ORDER — METOCLOPRAMIDE HCL 5 MG PO TABS
5.0000 mg | ORAL_TABLET | Freq: Three times a day (TID) | ORAL | Status: DC
  Administered 2017-12-14 – 2017-12-27 (×39): 5 mg via ORAL
  Filled 2017-12-14 (×40): qty 1

## 2017-12-14 NOTE — Progress Notes (Signed)
Pt b/p 89/56 and asymptomatic.  Delle Reining PA. notified order to recheck in an hour. Recheck  109/72. New order to hold aldactone and lasix. Will continue to monitor.

## 2017-12-14 NOTE — Progress Notes (Signed)
Speech Language Pathology Daily Session Note  Patient Details  Name: Glenda Walker MRN: [id number] Date of Birth: January 19, 1956  Today's Date: 12/14/2017 SLP Individual Time: 4098-1191 SLP Individual Time Calculation (min): 55 min  Short Term Goals: Week 1: SLP Short Term Goal 1 (Week 1): Pt will consume therapeutic trials of dys 3 textures and thin liquids with mod I use of swallowing precautions and minimal overt s/s of aspiration prior to diet advancement.  SLP Short Term Goal 2 (Week 1): Pt will sustain her attention to basic, familiar tasks for 5 minute intervals with min cues for redirection.    Skilled Therapeutic Interventions:  Pt was seen for skilled ST targeting cognitive goals.  Pt was transferred to wheelchair via [place] lift with +2 assist from her husband and mod verbal cues for initiation and sequencing.  Once seated upright in the wheelchair, pt was more alert in comparison to yesterday's therapy session and now presents with low level cognitive deficits characterized by decreased sustained attention, decreased functional problem solving, and decreased recall of information.  Pt needed mod-max cues for sequencing and organization when completing oral care and hand hygiene at the sink.  Pt was left in wheelchair with chair alarm set and husband at bedside.  Continue per current plan of care.      Pain Pain Assessment Pain Scale: 0-10 Pain Score: 0-No pain  Therapy/Group: Individual Therapy  Maegan Buller, Melanee Spry 12/14/2017, 9:01 AM

## 2017-12-14 NOTE — Progress Notes (Signed)
RT given in report that pt is to stay capped over night (24 hours) in preparation for decannulation in the next couple of days. RT assessed pt; BS bilat clear/diminished, pt's sats 99% on RA. Pt tolerating well. RT will continue to monitor.

## 2017-12-14 NOTE — Patient Care Conference (Signed)
Inpatient RehabilitationTeam Conference and Plan of Care Update Date: 12/14/2017   Time: 2:30 PM    Patient Name: Glenda Walker      Medical Record Number: [id number]  Date of Birth: Jun 27, 1955 Sex: Female         Room/Bed: 4M03C/4M03C-01 Payor Info: Payor: MEDICARE / Plan: MEDICARE PART A AND B / Product Type: *No Product type* /    Admitting Diagnosis: Debility  Admit Date/Time:  12/12/2017  5:52 PM Admission Comments: No comment available   Primary Diagnosis:  <principal problem not specified> Principal Problem: <principal problem not specified>  Patient Active Problem List   Diagnosis Date Noted  . Hyponatremia   . AKI (acute kidney injury) (HCC)   . Debility 12/12/2017  . Dysphagia   . Acute respiratory failure with hypoxia (HCC)   . Chronic systolic heart failure (HCC)   . Coronary artery disease involving coronary bypass graft of native heart without angina pectoris   . History of CVA with residual deficit   . Episode of recurrent major depressive disorder (HCC)   . Gastrointestinal hemorrhage   . Cirrhosis of liver without ascites (HCC)   . Diabetes mellitus type 2 in nonobese (HCC)   . Slow transit constipation   . Major depressive disorder, recurrent episode, mild (HCC) 11/25/2017  . Chronic respiratory failure with hypoxia (HCC) 11/01/2017  . Acute on chronic respiratory failure with hypoxia (HCC) 11/01/2017  . Acute on chronic systolic CHF (congestive heart failure) (HCC) 11/01/2017  . Acute metabolic encephalopathy 11/01/2017  . 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 Chronic atrial fibrillation   . Pleural effusion, right     Expected Discharge Date: Expected Discharge Date: 12/30/17  Team Members Present: Physician leading conference: Dr. Maryla Morrow Social Worker Present: Amada Jupiter, LCSW Nurse Present: Ronny Bacon, RN PT Present: Grier Rocher, PT;Rosita Dechalus, PTA OT Present: Perrin Maltese, OT SLP Present: Jackalyn Lombard, SLP PPS Coordinator  present : Tora Duck, RN, CRRN     Current Status/Progress Goal Weekly Team Focus  Medical   Limitation in self-care, endurance, dysphagia, and mobility secondary to debility.  Improve mobility, DM, COPD, respiratory function, AKI  See above   Bowel/Bladder   cont/incont of B/B; lbm 10/1  cont with mod assist  hourly rounding; toilet q2-3hr while awake; assess q shift AND PRN   Swallow/Nutrition/ Hydration   Dys 2, thin liquids; intermittent supervision   mod I   trials of dys 3 textures   ADL's   Min assist UB selfcare with mod to max for LB selfcare, max assist for stand pivot transfers.  Decreased sustained attention overall with decreased endurance  supervision overall    selfcare retraining, balance retraining, endurance building, energy conservation, therapeutic activity, therapeutic exercise, pt/family education   Mobility   max A transfers  min A gait, supervision transfers  activity tolerance, transfers, strength, gait   Communication             Safety/Cognition/ Behavioral Observations  limited by fatigue, decreased sustained attention to tasks, overall min assist for tasks assessed  Supervision   attention and ongoing diagnostic treatment of higher level cognition.    Pain   c/o pain to R chest at tube site and L neck;   <3  assess q shift and prn; tylenol   Skin   R chest tube site; skin tear R forearm; mild redness to buttocks; trach site  free from infection/breakdown  assess q shift and prn; dsg changes per order  Rehab Goals Patient on target to meet rehab goals: Yes *See Care Plan and progress notes for long and short-term goals.     Barriers to Discharge  Current Status/Progress Possible Resolutions Date Resolved   Physician    Medical stability;Trach     See above  Therapies, follow labs, encourage fluids, optimize DM meds, follow weights, capping trails      Nursing                  PT  Trach                 OT                  SLP                 SW                Discharge Planning/Teaching Needs:  Pt to return home with spouse who can provide 24/7 assistance.   Husband here daily (staying at the hospital)   Team Discussion:  Trach capped and hope to decannulate soon.  Pleurex still in place.  Supervision to min with UB b/d;  Max with LB.  Sit - stand at mod assist.  Min/mod to amb 3'.  Poor endurance/  Decreased sustained attetntion.  Needs cues and slow to process.  Overall goals set for supervision to min assist.  ST following for cognition.  Revisions to Treatment Plan:  NA    Continued Need for Acute Rehabilitation Level of Care: The patient requires daily medical management by a physician with specialized training in physical medicine and rehabilitation for the following conditions: Daily direction of a multidisciplinary physical rehabilitation program to ensure safe treatment while eliciting the highest outcome that is of practical value to the patient.: Yes Daily medical management of patient stability for increased activity during participation in an intensive rehabilitation regime.: Yes Daily analysis of laboratory values and/or radiology reports with any subsequent need for medication adjustment of medical intervention for : Cardiac problems;Pulmonary problems;Diabetes problems;Renal problems;Other   I attest that I was present, lead the team conference, and concur with the assessment and plan of the team.   Zein Helbing 12/14/2017, 3:53 PM

## 2017-12-14 NOTE — Consult Note (Signed)
Neuropsychological Consultation   Patient:   Glenda Walker   DOB:   1955-08-21  MR Number:  [id number]  Location:  [facility] [address] CENTER [id number] [place] [address] Dept: [phone] Loc: [phone]           Date of Service:   12/14/2017  Start Time:   2 PM End Time:   3 PM  Provider/Observer:  Arley Phenix, Psy.D.       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  Patient with multiple medical issues over summer with heart, liver and respiratory issues.   Patient developed debility over this time and CIR was recommended for intensive rehab program.  Patient has past history of depression.     Reason for Service:  Patient referred for neuropsychological consultation due to coping and adjustment issues with depression.  Below is the HPI for the current admission.  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 She was readmitted to [facility] from OSH on 7/29 with AMS, hypotension and respiratory failure requiring intubation. Hospital course significant for shock with severe heart failure--EF < 25%, PNA, acute renal failure, ileus with colonic pseudoobstruction requiring colonic decompression, hepatic encephalopathy with ammonia levels > 200, GIB, and difficulty with vent wean requiring tracheostomy on 10/26/17.  EP recommended ICD "once stable and rehabilatated". She required TPN and was started on trickle  tube feeds with slow increase in rate. She was extubated to ATC and transferred to [facility] on 10/31/17 for vent wean.    Moderate ascites noted on CT abdomen and patient was not a candidate for PEG tube and fluid not felt to be amenable to paracentesis.  She had difficulty with vent wean and required right  thoracocentesis 8/30 and 9/09. Ultimately right tunneled  Pleurex catheter placed on by Dr. Ardelle Anton on 09/17 and she tolerated weaning to ATC.  MBS done and she was started on dysphagia 1, thins on 9/24 as was tolerating PMSV trials.  She was downsized to CFS #6 on 9/26 and has been tolerating plugging during the day. She did have some bleeding around pleurex site 9/27 therefore  SQ heparin placed on hold. Serial H/H stable. Electrolyte abnormalities being supplemented and NGT discontinued on 9/25. Diet has been advanced to dysphagia 2, thin liquids and intake variable. She is showing improvement in activity tolerance and has been making progress with therapy. CIR was recommended by therapy and chart evaluated by Rehab MD/CM.  Patient deemed to be good candidate for intensive rehab program.    Current Status:  The patient reports that she is feeling stronger and breathing is improving.  She plans to not use oxygen tonight if she can tolerate to determine if trach can be removed.  Patient reports past issues with depression but that current depressive symptoms are mild and not inhibitory towards rehab efforts.  Cognition is slowed but intact with good mental status.    Behavioral Observation: Glenda Walker  presents as a 62 y.o.-year-old Right Caucasian Female who appeared her stated age. her dress was Appropriate and she was Well Groomed and her manners were Appropriate to the situation.  her participation was indicative of Appropriate and Inattentive behaviors.  There were any physical disabilities noted.  she displayed an appropriate level of cooperation and motivation.     Interactions:    Active  Appropriate and Intrusive  Attention:   abnormal and attention span appeared shorter than expected for age  Memory:   within normal limits; recent and remote memory intact  Visuo-spatial:  not examined  Speech (Volume):  low  Speech:   normal; normal  Thought Process:  Coherent and Relevant  Though Content:  WNL; not suicidal and not homicidal  Orientation:   person, place, time/date and situation  Judgment:   Fair  Planning:   Fair  Affect:    Depressed  Mood:    Dysphoric  Insight:   Good  Intelligence:   normal  Medical History:   Past Medical History:  Diagnosis Date  . Acute metabolic encephalopathy 11/01/2017  . Acute on chronic respiratory failure with hypoxia (HCC) 11/01/2017  . Acute on chronic systolic CHF (congestive heart failure) (HCC) 11/01/2017  . Chronic atrial fibrillation (HCC)   . Chronic respiratory failure with hypoxia (HCC) 11/01/2017  . Cirrhosis of liver with ascites (HCC)   . Coronary artery disease   . Diabetes type 2, uncontrolled (HCC)   . Hyperlipidemia   . Hypertension   . Hypothyroidism   . Pleural effusion, right   . Stroke (cerebrum) (HCC)   . Tracheostomy status (HCC) 11/01/2017     Psychiatric History:  Past history of depression  Family Med/Psych History:  Family History  Problem Relation Age of Onset  . 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  Patient with multiple medical issues over summer with heart, liver and respiratory issues.   Patient developed debility over this time and CIR was recommended for intensive rehab program.  Patient has past history of depression.   The patient reports that she is feeling stronger and breathing is improving.  She  plans to not use oxygen tonight if she can tolerate to determine if trach can be removed.  Patient reports past issues with depression but that current depressive symptoms are mild and not inhibitory towards rehab efforts.  Cognition is slowed but intact with good mental status.  Disposition/Plan:  Worked on coping and adjustment issues and assessed depression level.  Mild depressive features and are typical for premorbid functioning and not impairing rehab efforts.  Diagnosis:   Recurrent depression, current mild.        Electronically Signed   _______________________ Arley Phenix, Psy.D.

## 2017-12-14 NOTE — Progress Notes (Signed)
Responded to consult for PICC line dressing. Pt up for therapy and in restroom.

## 2017-12-14 NOTE — Progress Notes (Signed)
Occupational Therapy Session Note  Patient Details  Name: Glenda Walker MRN: [id number] Date of Birth: 01-Aug-1955  Today's Date: 12/14/2017 OT Individual Time: 1300-1333 OT Individual Time Calculation (min): 33 min    Short Term Goals: Week 1:  OT Short Term Goal 1 (Week 1): Pt will complete Ub bathing and dressing with supervision. OT Short Term Goal 2 (Week 1): Pt will complete LB bathing sit to stand with min assist. OT Short Term Goal 3 (Week 1): Pt will complete LB dressing with mod assist sit to stand for 2 consecutive sessions. OT Short Term Goal 4 (Week 1): Pt will complete toilet transfers with mod assist stand pivot RW to 3:1 OT Short Term Goal 5 (Week 1): Pt will maintain sustained attention during selfcare tasks for 15 mins with no more than mod instructional cueing for re-direction.  Skilled Therapeutic Interventions/Progress Updates:    Pt transferred from supine to sit EOB with min assist.  Sits with posterior pelvic tilt and left lean with head tilt to the left as well.  Needs min assist for reposition and to maintain for 15 seconds.  Worked on sit to stand from the EOB with mod assist with decreased forward weightshift and increased lean posteriorly.  She was able to transfer stand pivot with the RW with mod assist to the wheelchair and back to the bed X2.  Mod demonstrational cueing for sequencing transfer and advancement of the walker.  Pt stayed in the wheelchair at end of session with call button and phone in reach and spouse present.    Therapy Documentation Precautions:  Precautions Precautions: Fall Precaution Comments: trach Restrictions Weight Bearing Restrictions: No  Pain: Pain Assessment Pain Score: 0-No pain ADL: See Care Plan Section for details  Therapy/Group: Individual Therapy  Gleb Iliyah Bui OTR/L 12/14/2017, 2:37 PM

## 2017-12-14 NOTE — Progress Notes (Signed)
[place] PHYSICAL MEDICINE & REHABILITATION     PROGRESS NOTE  Subjective/Complaints:  Patient seen laying in bed this morning.  She states she slept well overnight.  She states she had a good first day of therapies yesterday.  Discussed with nursing regarding plugging trach at night.  ROS: Denies CP, S OB, nausea, vomiting, diarrhea.  Objective: Vital Signs: Blood pressure (!) 148/91, pulse 95, temperature 98.4 F (36.9 C), temperature source Oral, resp. rate 17, weight 69.1 kg, SpO2 98 %. No results found. Recent Labs    12/13/17 0751  WBC 9.2  HGB 9.0*  HCT 29.1*  PLT 280   Recent Labs    12/13/17 0751  NA 129*  K 4.0  CL 94*  GLUCOSE 109*  BUN 33*  CREATININE 1.31*  CALCIUM 10.3   CBG (last 3)  Recent Labs    12/13/17 1654 12/13/17 2127 12/14/17 0735  GLUCAP 153* 133* 108*    Wt Readings from Last 3 Encounters:  12/14/17 69.1 kg  12/10/17 73.6 kg    Physical Exam:  BP (!) 148/91 (BP Location: Right Arm)   Pulse 95   Temp 98.4 F (36.9 C) (Oral)   Resp 17   Wt 69.1 kg   LMP  (LMP Unknown)   SpO2 98%   BMI 24.59 kg/m  Constitutional: She appears well-developed. Frail  HENT: Normocephalic and atraumatic.  Eyes: EOM are normal.  No discharge.  Neck: CTS # 6.   Cardiovascular: Irregularly irregular.  No JVD. Respiratory: Effort normal. She has decreased breath sounds.  Right lower lateral chest with Pleurex catheter in place with dry dressing.   GI:  Mildly distended. Bowel sounds are normal.  Musculoskeletal: No edema or tenderness in extremities  Neurological: She is alert and oriented  Very delayed and slow speech, improving.  She was able to follow basic motor commands.  Motor: Grossly 4/5 throughout, stable  Skin: Skin is warm and dry.  Psychiatric: Her affect is blunt. Her speech is delayed. She is slowed and withdrawn.   Assessment/Plan: 1. Functional deficits secondary to debility which require 3+ hours per day of interdisciplinary  therapy in a comprehensive inpatient rehab setting. Physiatrist is providing close team supervision and 24 hour management of active medical problems listed below. Physiatrist and rehab team continue to assess barriers to discharge/monitor patient progress toward functional and medical goals.  Function:  Bathing Bathing position      Bathing parts      Bathing assist        Upper Body Dressing/Undressing Upper body dressing                    Upper body assist        Lower Body Dressing/Undressing Lower body dressing                                  Lower body assist        Toileting Toileting          Toileting assist     Transfers Chair/bed transfer             Locomotion Ambulation           Wheelchair          Cognition Comprehension    Expression    Social Interaction    Problem Solving    Memory      Medical Problem List and  Plan: 1.  Limitation in self-care, endurance, dysphagia, and mobility secondary to debility.  Continue CIR 2.  DVT Prophylaxis/Anticoagulation: Mechanical: Sequential compression devices, below knee Bilateral lower extremities.  Lovenox started on 10/1.  Dopplers negative for DVT 3. Pain Management: Tylenol prn 4. Mood: LCSW to follow for evaluation and support.  5. Neuropsych: This patient is not fully capable of making decisions on her own behalf. 6. Skin/Wound Care: Routine pressure relief measures.  Maintain adequate nutritional hydration status.  Added supplements 3 times daily. 7. Fluids/Electrolytes/Nutrition: Monitor I's and O's.    Dysphagia 2 thins, advance diet as tolerated 8.  Acute on chronic respiratory failure: Question history of OSA.   Plan to keep plugged and monitor tolerance as well as O2 sats 9.  Right pleural effusion: Right Pleurx drain placed 09/17 by radiology.  Drain Pleurx daily-monitor for any signs of bleeding.  10.  Chronic systolic CHF: Monitor weights daily for  trends and watch for other signs of overload. On spironolactone, lasix and low dose ASA. No statin due to cirrhosis.    Filed Weights   12/13/17 0305 12/14/17 0534  Weight: 68.2 kg 69.1 kg   Stable on 10/2 11.  Chronic A fib: Monitor heart rate twice daily--resumed low dose BB due to tachycardia.  Was on Xarelto at home due to embolic stroke  Will consider restarting, after decannulation  Monitor heart rate with increased mobility 12. CAD s/p CABG/ICM: Will need ICD in the future.  13. H/o R-MCA stroke: Has been off Xarelto since June per husband/records. Continue low dose ASA.    Will consider restarting, after decannulation 14. H/o depression:  Stable on paxil. Also on modafinil for activation.  15. GIB with hematochezia due to esophagitis: On PPI bid.  Hemoglobin 9.0 on 10/2.  Cont to monitor 16. Cirrhosis of the liver/Hepatic encephalopathy: Continue Rifaximin (off lactulose)  Ammonia remains elevated at 36 on 10/1 17. T2DM: Hgb A1C- 5.5. Her intake variable therefore will monitor BS ac/hs and use SSI for now.   Relatively controlled on 10/2 18. Constipation: No BM since 9/24 per nursing. Increased miralax to bid. May need to resume lactulose. 19. Code status: Husband wants full code now that patient is getting better.  20. Hyponatremia  Sodium 129 on 10/1  Continue to monitor 21. AKI  Creatinine 1.31 on 10/1  Encourage fluids  LOS (Days) 2 A FACE TO FACE EVALUATION WAS PERFORMED  Rayn Shorb Karis Juba 12/14/2017 7:50 AM

## 2017-12-14 NOTE — Progress Notes (Signed)
Physical Therapy Session Note  Patient Details  Name: Glenda Walker MRN: [id number] Date of Birth: 07/26/1955  Today's Date: 12/14/2017 PT Individual Time: 0905-1000 AND 1530-1630 PT Individual Time Calculation (min): 55 min AND 60 min  Short Term Goals: Week 1:  PT Short Term Goal 1 (Week 1): pt will perform functional transfers with mod A PT Short Term Goal 2 (Week 1): pt will perform gait with max A x 10' in controlled environment  Skilled Therapeutic Interventions/Progress Updates:   Session 1.  Pt received sitting in WC and agreeable to PT  RN reports BP low this AM. PT assessed BP in sitting:86/57 Sit<>stand in paralell bars with max assist, BP assessed in standing at 0 min 80/62 and 2 min 90/58. No s/s of orthostasis. Additional sit<>stand in parallel bars x 2 with moderate assist and increased time. Sit<>stand also completed at [facility] with max assist x 2.   Gait in parallel bars 66f forward/backward with moderate assist and moderate cues for 4 point gait pattern.   WC mobiltiy x 75 with mod assist/cues to attention to task and continued propulsion through BUE.   [facility] and all needs [facility].     Session 2.   Pt received supine in bed and agreeable to PT. Supine>sit transfer with min assist and min cues   Stand pivot transfer with mod assist from elevated surfaces and max assist from WC height. Sit<>stand with max assist wit intermittent mod assist throughout PT treatment and max cues for use of arm rests to push into standing.   Nustep sustained attention task and reciprocal movement training. Mod assist and max cues to attend to task greater SpO2 monitored throughout with one instance of desat to 84% and then increased to >92% with short rest break.   Gait training 121fx 2 with mod assist overall to prevent posterior LOB. PT assessed SpO2 following gait 87% initially and increased to 100% after 30 seconds of rest.    WC Mobility x 5077fith min assist and moderate cues for attention to task as well as improved stroke length to propel WC.   Pt returned to room and performed stand pivot  transfer to bed with moderate assist. Sit>supine completed with min assist to assist the RLE, and left supine in bed with call bell in reach and all needs met.       Therapy Documentation Precautions:  Precautions Precautions: Fall Precaution Comments: trach Restrictions Weight Bearing Restrictions: No General:   Vital Signs: Therapy Vitals Temp: 97.9 F (36.6 C) Temp Source: Oral Pulse Rate: 84 Resp: 17 BP: (!) 86/56 Patient Position (if appropriate): Sitting Oxygen Therapy SpO2: 90 % O2 Device: Room Air FiO2 (%): 28 % Pain: Pain Assessment Pain Scale: 0-10 Pain Score: 0-No pain Mobility:   Locomotion :    Trunk/Postural Assessment :    Balance:   Exercises:   Other Treatments:      Therapy/Group: Individual Therapy  AusLorie Phenix/04/2017, 10:07 AM

## 2017-12-15 ENCOUNTER — Inpatient Hospital Stay (HOSPITAL_COMMUNITY): Payer: Medicare Other

## 2017-12-15 ENCOUNTER — Inpatient Hospital Stay (HOSPITAL_COMMUNITY): Payer: Medicare Other | Admitting: Occupational Therapy

## 2017-12-15 ENCOUNTER — Inpatient Hospital Stay (HOSPITAL_COMMUNITY): Payer: Medicare Other | Admitting: Speech Pathology

## 2017-12-15 ENCOUNTER — Inpatient Hospital Stay (HOSPITAL_COMMUNITY): Payer: Medicare Other | Admitting: Physical Therapy

## 2017-12-15 LAB — BASIC METABOLIC PANEL
Anion gap: 7 (ref 5–15)
BUN: 29 mg/dL — AB (ref 8–23)
CHLORIDE: 97 mmol/L — AB (ref 98–111)
CO2: 25 mmol/L (ref 22–32)
CREATININE: 1.18 mg/dL — AB (ref 0.44–1.00)
Calcium: 9.9 mg/dL (ref 8.9–10.3)
GFR calc non Af Amer: 49 mL/min — ABNORMAL LOW (ref >60)
GFR, EST AFRICAN AMERICAN: 56 mL/min — AB (ref >60)
Glucose, Bld: 151 mg/dL — ABNORMAL HIGH (ref 70–99)
Potassium: 4.9 mmol/L (ref 3.5–5.1)
SODIUM: 129 mmol/L — AB (ref 135–145)

## 2017-12-15 LAB — GLUCOSE, CAPILLARY
GLUCOSE-CAPILLARY: 130 mg/dL — AB (ref 70–99)
Glucose-Capillary: 115 mg/dL — ABNORMAL HIGH (ref 70–99)
Glucose-Capillary: 142 mg/dL — ABNORMAL HIGH (ref 70–99)
Glucose-Capillary: 145 mg/dL — ABNORMAL HIGH (ref 70–99)

## 2017-12-15 LAB — URINE CULTURE

## 2017-12-15 NOTE — Care Management (Signed)
Inpatient Rehabilitation Center Individual Statement of Services  Patient Name:  Glenda Walker  Date:  12/15/2017  Welcome to the Inpatient Rehabilitation Center.  Our goal is to provide you with an individualized program based on your diagnosis and situation, designed to meet your specific needs.  With this comprehensive rehabilitation program, you will be expected to participate in at least 3 hours of rehabilitation therapies Monday-Friday, with modified therapy programming on the weekends.  Your rehabilitation program will include the following services:  Physical Therapy (PT), Occupational Therapy (OT), Speech Therapy (ST), 24 hour per day rehabilitation nursing, Therapeutic Recreaction (TR), Neuropsychology, Case Management (Social Worker), Rehabilitation Medicine, Nutrition Services and Pharmacy Services  Weekly team conferences will be held on Wednesdays to discuss your progress.  Your Social Worker will talk with you frequently to get your input and to update you on team discussions.  Team conferences with you and your family in attendance may also be held.  Expected length of stay: 2-3 weeks   Overall anticipated outcome: minimal assistance  Depending on your progress and recovery, your program may change. Your Social Worker will coordinate services and will keep you informed of any changes. Your Social Worker's name and contact numbers are listed  below.  The following services may also be recommended but are not provided by the Inpatient Rehabilitation Center:   Driving Evaluations  Home Health Rehabiltiation Services  Outpatient Rehabilitation Services   Arrangements will be made to provide these services after discharge if needed.  Arrangements include referral to agencies that provide these services.  Your insurance has been verified to be:  Medicare Your primary doctor is:  Dr. Donnamarie Rossetti [facility] [place]  Pertinent information will be shared with your doctor and your  insurance company.  Social Worker:  [place], [place] [phone] or ([phone]   Information discussed with and copy given to patient by: Amada Jupiter, 12/15/2017, 12:34 PM

## 2017-12-15 NOTE — Progress Notes (Signed)
Social Work Patient ID: Glenda Walker, female   DOB: 1955-05-16, 62 y.o.   MRN: [id number]   Have reviewed team conference with pt and spouse.  Both aware and agreeable with targeted d/c date of 10/18 and supervision - min assist goals.  Spouse remains very supportive.  Denies any concerns at this point.  Shaylon Aden, LCSW

## 2017-12-15 NOTE — Progress Notes (Signed)
Social Work  Social Work Assessment and Plan  Patient Details  Name: Glenda Walker MRN: [id number] Date of Birth: Jan 11, 1956  Today's Date: 12/14/2017  Problem List:  Patient Active Problem List   Diagnosis Date Noted  . Hyponatremia   . AKI (acute kidney injury) (HCC)   . Debility 12/12/2017  . Dysphagia   . Acute respiratory failure with hypoxia (HCC)   . Chronic systolic heart failure (HCC)   . Coronary artery disease involving coronary bypass graft of native heart without angina pectoris   . History of CVA with residual deficit   . Episode of recurrent major depressive disorder (HCC)   . Gastrointestinal hemorrhage   . Cirrhosis of liver without ascites (HCC)   . Diabetes mellitus type 2 in nonobese (HCC)   . Slow transit constipation   . Major depressive disorder, recurrent episode, mild (HCC) 11/25/2017  . Chronic respiratory failure with hypoxia (HCC) 11/01/2017  . Acute on chronic respiratory failure with hypoxia (HCC) 11/01/2017  . Acute on chronic systolic CHF (congestive heart failure) (HCC) 11/01/2017  . Acute metabolic encephalopathy 11/01/2017  . 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 Chronic atrial fibrillation   . Pleural effusion, right    Past Medical History:  Past Medical History:  Diagnosis Date  . Acute metabolic encephalopathy 11/01/2017  . Acute on chronic respiratory failure with hypoxia (HCC) 11/01/2017  . Acute on chronic systolic CHF (congestive heart failure) (HCC) 11/01/2017  . Chronic atrial fibrillation (HCC)   . Chronic respiratory failure with hypoxia (HCC) 11/01/2017  . Cirrhosis of liver with ascites (HCC)   . Coronary artery disease   . Diabetes type 2, uncontrolled (HCC)   . Hyperlipidemia   . Hypertension   . Hypothyroidism   . Pleural effusion, right   . Stroke (cerebrum) (HCC)   . 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 AORTIC VALVE REPLACEMENT  (AVR)/CORONARY ARTERY BYPASS GRAFTING (CABG)    . CORONARY ARTERY BYPASS GRAFT    . IR PERC TUN PERIT CATH WO PORT S&I Judi Cong  11/29/2017  . TRACHEOSTOMY     Social History:  has an unknown smoking status. She has never used smokeless tobacco. She reports that she drank alcohol. She reports that she has current or past drug history.  Family / Support Systems Marital Status: Married How Long?: 41 yrs Patient Roles: Spouse, Parent(grandparent) Spouse/Significant Other: spouse, Glenda Walker @ (C) [phone] Children: 3 adult sons living in [place], [place] and [place]. Anticipated Caregiver: husband: Sherrine Maples Ability/Limitations of Caregiver: 24/7 plus son lives close as well as 2 daughter-in-laws available to assist as needed Caregiver Availability: 24/7 Family Dynamics: Pt and spouse note sons and their wives are very supportive and can offer additional assist when spouse not available.  Social History Preferred language: English Religion: Other Cultural Background: NA Read: Yes Write: Yes Employment Status: Disabled Date Retired/Disabled/Unemployed: since 2001 per spouse due to "back injury" Legal Hisotry/Current Legal Issues: None Guardian/Conservator: None - per MD, pt not fully capable of making decisions on her own behalf - defer to spouse.   Abuse/Neglect Abuse/Neglect Assessment Can Be Completed: Yes Physical Abuse: Denies Verbal Abuse: Denies Sexual Abuse: Denies Exploitation of patient/patient's resources: Denies Possible abuse reported to:: [facility] department of social services  Emotional Status Pt's affect, behavior adn adjustment status: Pt lying in bed and appears very fatigued from first day of therapies.  She makes little eye contact and answers to questions  are short and delayed. She denies any significant emotional distress, however, will plan to refer for neuropsychology consult for further evaluation. Recent Psychosocial Issues: Chronic health issues. Pyschiatric  History: none Substance Abuse History: none  Patient / Family Perceptions, Expectations & Goals Pt/Family understanding of illness & functional limitations: Pt's spouse able to provide detailed report of her medical course.  Both aware of current functional limitations/ need for CIR. Premorbid pt/family roles/activities: Pt was completely independent PTA Anticipated changes in roles/activities/participation: per goals of supervision - minimal assistance, spouse to provide primary caregiver support. Pt/family expectations/goals: "I just hope she can get her strength back." (spouse)  [employer]: None Premorbid Home Care/DME Agencies: None Transportation available at discharge: yes Resource referrals recommended: Neuropsychology  Discharge Planning Living Arrangements: Spouse/significant other Support Systems: Spouse/significant other, Children, Other relatives Type of Residence: Private residence Insurance Resources: Electrical engineer Resources: Restaurant manager, fast food Screen Referred: No Living Expenses: Own Money Management: Spouse Does the patient have any problems obtaining your medications?: No Home Management: Pt and spouse shared responsibilities Patient/Family Preliminary Plans: Pt to return home with spouse who can provide 24/7 assistance. Social Work Anticipated Follow Up Needs: HH/OP Expected length of stay: 15-20 days  Clinical Impression Very debilitated woman here following multiple medical issues including VDRF and now with tracheostomy -  with hopes to wean during CIR stay.  Spouse at bedside and has been staying since her admission.  He is able to provide 24/7 assistance at d/c.  Pt very fatigued from full day of therapies and offers only brief/ delayed answers.  Pt denies any significant emotional distress, however, will monitor and involved neuropsych as indicated. Glenda Walker 12/14/2017, 11:54 AM

## 2017-12-15 NOTE — Progress Notes (Addendum)
Occupational Therapy Session Note  Patient Details  Name: Glenda Walker MRN: [id number] Date of Birth: 05-Sep-1955  Today's Date: 12/15/2017 OT Individual Time: 5573-2202 OT Individual Time Calculation (min): 60 min    Short Term Goals: Week 1:  OT Short Term Goal 1 (Week 1): Pt will complete Ub bathing and dressing with supervision. OT Short Term Goal 2 (Week 1): Pt will complete LB bathing sit to stand with min assist. OT Short Term Goal 3 (Week 1): Pt will complete LB dressing with mod assist sit to stand for 2 consecutive sessions. OT Short Term Goal 4 (Week 1): Pt will complete toilet transfers with mod assist stand pivot RW to 3:1 OT Short Term Goal 5 (Week 1): Pt will maintain sustained attention during selfcare tasks for 15 mins with no more than mod instructional cueing for re-direction.  Skilled Therapeutic Interventions/Progress Updates:    Session focused on sustained attention and initiation during toileting and b/d tasks. RN alerted to stomach pain during session. Pt's husband present throughout session and extremely supportive. Pt required multimodal cueing throughout session for initiation of any task. Unclear whether pt not initiating d/t behaviors or cognition at this time, d/t several mean comments to therapist throughout. Pt required max A to don shirt and pants sitting on BSC. Max A required for clothing management during stand pivot transfer to [facility]. Pt was left sitting up in recliner with husband present and all needs met. All VSS throughout session.   Therapy Documentation Precautions:  Precautions Precautions: Fall Precaution Comments: trach Restrictions Weight Bearing Restrictions: No Vital Signs: Therapy Vitals Temp: 98.7 F (37.1 C) Pulse Rate: 71 Resp: 16 BP: 103/68 Patient Position (if appropriate): Lying Oxygen Therapy SpO2: 98 % O2 Device: Room Air Pain: Pain Assessment Pain Scale: 0-10 Pain Score: 6  Pain Type: Acute pain Pain Location:  Flank Pain Orientation: Right Pain Descriptors / Indicators: Burning Pain Onset: On-going Pain Intervention(s): RN made aware   Therapy/Group: Individual Therapy  Curtis Sites 12/15/2017, 8:30 AM

## 2017-12-15 NOTE — Progress Notes (Signed)
[facility] PHYSICAL MEDICINE & REHABILITATION     PROGRESS NOTE  Subjective/Complaints:  Patient seen laying in bed this morning.  She states she slept well overnight, confirmed with husband.  Both patient and husband state patient did not have any breathing issues overnight well plugged.  ROS: Denies CP, S OB, nausea, vomiting, diarrhea.  Objective: Vital Signs: Blood pressure 103/68, pulse 71, temperature 98.7 F (37.1 C), resp. rate 16, weight 69.8 kg, SpO2 98 %. No results found. Recent Labs    12/13/17 0751  WBC 9.2  HGB 9.0*  HCT 29.1*  PLT 280   Recent Labs    12/13/17 0751 12/15/17 0454  NA 129* 129*  K 4.0 4.9  CL 94* 97*  GLUCOSE 109* 151*  BUN 33* 29*  CREATININE 1.31* 1.18*  CALCIUM 10.3 9.9   CBG (last 3)  Recent Labs    12/14/17 1654 12/14/17 2133 12/15/17 0654  GLUCAP 119* 145* 130*    Wt Readings from Last 3 Encounters:  12/15/17 69.8 kg  12/10/17 73.6 kg    Physical Exam:  BP 103/68 (BP Location: Right Arm)   Pulse 71   Temp 98.7 F (37.1 C)   Resp 16   Wt 69.8 kg   LMP  (LMP Unknown)   SpO2 98%   BMI 24.84 kg/m  Constitutional: She appears well-developed. Frail  HENT: Normocephalic and atraumatic.  Eyes: EOM are normal.  No discharge.  Neck: CTS # 6.   Cardiovascular: Irregularly irregular.  No JVD. Respiratory: Effort normal. She has decreased breath sounds.  Right lower lateral chest with Pleurex catheter in place with dry dressing.   GI:  Mildly distended. Bowel sounds are normal.  Musculoskeletal: No edema or tenderness in extremities  Neurological: She is alert and oriented  Delayed and slow speech, improving.  She was able to follow basic motor commands.  Motor: Grossly 4/5 throughout, unchanged  Skin: Skin is warm and dry.  Psychiatric: Her affect is blunt. Her speech is delayed. She is slowed and withdrawn.   Assessment/Plan: 1. Functional deficits secondary to debility which require 3+ hours per day of  interdisciplinary therapy in a comprehensive inpatient rehab setting. Physiatrist is providing close team supervision and 24 hour management of active medical problems listed below. Physiatrist and rehab team continue to assess barriers to discharge/monitor patient progress toward functional and medical goals.  Function:  Bathing Bathing position      Bathing parts      Bathing assist        Upper Body Dressing/Undressing Upper body dressing                    Upper body assist        Lower Body Dressing/Undressing Lower body dressing                                  Lower body assist        Toileting Toileting     Toileting steps completed by helper: Adjust clothing prior to toileting, Performs perineal hygiene, Adjust clothing after toileting    Toileting assist Assist level: Two helpers   Transfers Chair/bed Optician, dispensing          Cognition Comprehension Comprehension assist level: Follows basic conversation/direction with extra time/assistive device  Expression Expression assist level: Expresses basic needs/ideas: With extra time/assistive device  Social Interaction Social Interaction assist level: Interacts appropriately 75 - 89% of the time - Needs redirection for appropriate language or to initiate interaction.  Problem Solving    Memory      Medical Problem List and Plan: 1.  Limitation in self-care, endurance, dysphagia, and mobility secondary to debility.  Continue CIR 2.  DVT Prophylaxis/Anticoagulation: Mechanical: Sequential compression devices, below knee Bilateral lower extremities.  Lovenox started on 10/1.  Dopplers negative for DVT 3. Pain Management: Tylenol prn 4. Mood: LCSW to follow for evaluation and support.  5. Neuropsych: This patient is not fully capable of making decisions on her own behalf. 6. Skin/Wound Care: Routine pressure relief measures.  Maintain  adequate nutritional hydration status.  Added supplements 3 times daily. 7. Fluids/Electrolytes/Nutrition: Monitor I's and O's.    Dysphagia 2 thins, advance diet as tolerated 8.  Acute on chronic respiratory failure: Question history of OSA.   Keep plugged and monitor tolerance as well as O2 sats 9.  Right pleural effusion: Right Pleurx drain placed 09/17 by radiology.  Drain Pleurx daily-monitor for any signs of bleeding.  10.  Chronic systolic CHF: Monitor weights daily for trends and watch for other signs of overload. On spironolactone, lasix and low dose ASA. No statin due to cirrhosis.    Filed Weights   12/13/17 0305 12/14/17 0534 12/15/17 0500  Weight: 68.2 kg 69.1 kg 69.8 kg   Stable on 10/3 11.  Chronic A fib: Monitor heart rate twice daily--resumed low dose BB due to tachycardia.  Was on Xarelto at home due to embolic stroke  Will consider restarting, after decannulation  Monitor heart rate with increased mobility 12. CAD s/p CABG/ICM: Will need ICD in the future.  13. H/o R-MCA stroke: Has been off Xarelto since June per husband/records. Continue low dose ASA.    Will consider restarting, after decannulation 14. H/o depression:  Stable on paxil. Also on modafinil for activation.  15. GIB with hematochezia due to esophagitis: On PPI bid.  Hemoglobin 9.0 on 10/2.  Cont to monitor 16. Cirrhosis of the liver/Hepatic encephalopathy: Continue Rifaximin (off lactulose)  Ammonia remains elevated at 36 on 10/1 17. T2DM: Hgb A1C- 5.5. Her intake variable therefore will monitor BS ac/hs and use SSI for now.   ?  Trending up on 10/3 18. Constipation: No BM since 9/24 per nursing. Increased miralax to bid. May need to resume lactulose. 19. Code status: Husband wants full code now that patient is getting better.  20. Hyponatremia  Sodium 129 on 10/3  Continue to monitor 21. AKI  Creatinine 1.18 on 10/3  Encourage fluids  LOS (Days) 3 A FACE TO FACE EVALUATION WAS PERFORMED  Chloee Tena  Karis Juba 12/15/2017 8:25 AM

## 2017-12-15 NOTE — Progress Notes (Signed)
Occupational Therapy Session Note  Patient Details  Name: [name] MRN: [id number] Date of Birth: [birth year]  Today's Date: 12/15/2017 OT Individual Time: 1347-1416 OT Individual Time Calculation (min): 29 min    Short Term Goals: Week 1:  OT Short Term Goal 1 (Week 1): Pt will complete Ub bathing and dressing with supervision. OT Short Term Goal 2 (Week 1): Pt will complete LB bathing sit to stand with min assist. OT Short Term Goal 3 (Week 1): Pt will complete LB dressing with mod assist sit to stand for 2 consecutive sessions. OT Short Term Goal 4 (Week 1): Pt will complete toilet transfers with mod assist stand pivot RW to 3:1 OT Short Term Goal 5 (Week 1): Pt will maintain sustained attention during selfcare tasks for 15 mins with no more than mod instructional cueing for re-direction.  Skilled Therapeutic Interventions/Progress Updates:    Pt completed transfer from wheelchair to therapy mat with mod assist using the RW for support.  Once on the mat, had pt focus on standing with use of the RW for support and working on peg board activity, having to replicate a simple design from a picture.  She was able to complete sit to stand X 2 and maintain standing for 4 mins with min assist.  She needed max instructional cueing to complete design using only one row of the peg board and two colors.  Returned to the room at end of session with pt requesting to stay up in order to finish lunch.  Her spouse present throughout session as well as for providing supervision..    Therapy Documentation Precautions:  Precautions Precautions: Fall Precaution Comments: trach Restrictions Weight Bearing Restrictions: No  Pain: Pain Assessment Pain Scale: Faces Pain Score: 0-No pain ADL: Therapy/Group: Individual Therapy  Azazel Franze OTR/L 12/15/2017, 4:53 PM

## 2017-12-15 NOTE — Progress Notes (Signed)
Physical Therapy Session Note  Patient Details  Name: Glenda Walker MRN: [id number] Date of Birth: 1955-05-12  Today's Date: 12/15/2017 PT Individual Time: 1105-1200 PT Individual Time Calculation (min): 55 min   Short Term Goals: Week 1:  PT Short Term Goal 1 (Week 1): pt will perform functional transfers with mod A PT Short Term Goal 2 (Week 1): pt will perform gait with max A x 10' in controlled environment  Skilled Therapeutic Interventions/Progress Updates:   Pt received supine in bed and agreeable to PT. Supine>sit transfer with supervision assist and min cues for safety and full upright posture. Stand pivot transfer to [facility] with moderate assist from PT and UE support on RW. Pt transported to rehab gym in [facility]  Gait training with RW 27f x 2. SpO2 monitored durring and after gait gait and noted to desat to 79-82 immediatly following. Increased to >90% in less to 30sec each time.   PT isntructed pt in stair management to ascend/descend 6 inch step x 2 with mod assist from PT and moderate multimodal cues for step to gait pattern and proper use of BUE to support on rail.   Sustained attention task to complete peg board puzzle in sitting. Attempted to perform in standing, pt able to complete half of puzzle with increased cues in standing until requesting to return to sitting.   [facility] returned to [facility] and [facility] reach and all needs met.          Therapy Documentation Precautions:  Precautions Precautions: Fall Precaution Comments: trach Restrictions Weight Bearing Restrictions: No    Vital Signs: Therapy Vitals Resp: 15 Patient Position (if appropriate): Sitting Oxygen Therapy SpO2: 98 %(per speech therapy) O2 Device: Room Air Pain: Pain Assessment Pain Scale: 0-10 Pain Score: 5  Pain Type: Acute pain Pain Location: Head Pain Orientation: Right Pain Descriptors / Indicators: Headache Pain Frequency: Intermittent Pain Onset:  On-going Patients Stated Pain Goal: 4 Pain Intervention(s): Medication (See eMAR)    Therapy/Group: Individual Therapy  ALorie Phenix10/05/2017, 11:19 AM

## 2017-12-15 NOTE — Progress Notes (Addendum)
Speech Language Pathology Daily Session Note  Patient Details  Name: JOYCELYNN Walker MRN: [id number] Date of Birth: [birth year]  Today's Date: 12/15/2017 SLP Individual Time: 900 - 945 SLP Individual Time Calculation (min): 45 min  Short Term Goals: Week 1: SLP Short Term Goal 1 (Week 1): Pt will consume therapeutic trials of dys 3 textures and thin liquids with mod I use of swallowing precautions and minimal overt s/s of aspiration prior to diet advancement.  SLP Short Term Goal 2 (Week 1): Pt will sustain her attention to basic, familiar tasks for 5 minute intervals with min cues for redirection.    Skilled Therapeutic Interventions:  Pt was seen for skilled ST targeting goals for dysphagia and cognition.  Upon arrival, pt was seated upright in recliner, grimacing, with complaints of 5/10 headache.  Pt also appeared subjectively more fatigued today in comparison to previous therapy session and had increased response latency to both therapist's and husband's questions.  RN made aware of headache and meds were administered during therapy session.  BP also checked given pt's decreased alertness.  BP 96/68 while seated which improved to 103/67 when compression stockings were donned.  Despite increase in BP, pt remained lethargic and distracted by pain; as a result, therapist used a 15 minute therapeutic rest break to allow medications to take effect and hopefully improve pt's participation in therapies.  Upon return to room, pt stated that she felt better and SLP facilitated the session with a functional snack of dys 3 textures.  Pt demonstrated decreased attention to bolus both orally and before placing food into her mouth and repeatedly dropped items with delayed awareness.  Pt was obviously distracted by outside conversations between RN and her husband and became visibly upset with therapist's attempts to redirect her, even when her distraction was impeding nursing's ability to administer morning  medications.  SLP explained rationale for ST interventions while inpatient for maximize functional independence and reducing burden of care prior to discharge.  Pt appeared more calm after therapist's explanation and was left sitting comfortably in her wheelchair with husband at bedside.  Continue per current plan of care.     Pain Pain Assessment Pain Scale: 0-10 Pain Score: 5  Pain Type: Acute pain Pain Location: Head Pain Descriptors / Indicators: Headache Pain Frequency: Intermittent Pain Onset: On-going Patients Stated Pain Goal: 4 Pain Intervention(s): RN made aware  Therapy/Group: Individual Therapy  Glenda Walker, Melanee Spry 12/15/2017, 12:19 PM

## 2017-12-16 ENCOUNTER — Inpatient Hospital Stay (HOSPITAL_COMMUNITY): Payer: Medicare Other | Admitting: Physical Therapy

## 2017-12-16 ENCOUNTER — Inpatient Hospital Stay (HOSPITAL_COMMUNITY): Payer: Medicare Other | Admitting: Occupational Therapy

## 2017-12-16 ENCOUNTER — Inpatient Hospital Stay (HOSPITAL_COMMUNITY): Payer: Medicare Other | Admitting: Speech Pathology

## 2017-12-16 LAB — GLUCOSE, CAPILLARY
GLUCOSE-CAPILLARY: 111 mg/dL — AB (ref 70–99)
GLUCOSE-CAPILLARY: 164 mg/dL — AB (ref 70–99)
GLUCOSE-CAPILLARY: 133 mg/dL — AB (ref 70–99)
Glucose-Capillary: 94 mg/dL (ref 70–99)

## 2017-12-16 NOTE — Progress Notes (Signed)
[place] PHYSICAL MEDICINE & REHABILITATION PROGRESS NOTE  Subjective/Complaints:  Patient seen laying in bed this AM.  She slept well overnight, confirmed by sleep chart.  Husband not at bedside this AM.  She denies issues with plugging  ROS: Denies  CP, SOB, nausea, vomiting, diarrhea  Objective: Vital Signs: Blood pressure 121/86, pulse 86, temperature 98.4 F (36.9 C), temperature source Oral, resp. rate 20, weight 70.3 kg, SpO2 100 %. No results found. No results for input(s): WBC, HGB, HCT, PLT in the last 72 hours. Recent Labs    12/15/17 0454  NA 129*  K 4.9  CL 97*  CO2 25  GLUCOSE 151*  BUN 29*  CREATININE 1.18*  CALCIUM 9.9    Physical Exam: Today's Vitals   12/15/17 1527 12/15/17 1650 12/15/17 2103 12/16/17 0625  BP: 118/66  105/73 121/86  Pulse: 78 82 84 86  Resp: 19 18 18 20   Temp:   98.4 F (36.9 C) 98.4 F (36.9 C)  TempSrc:   Oral Oral  SpO2: 100% 98%  100%  Weight:    70.3 kg  PainSc:   0-No pain    Body mass index is 25.01 kg/m.  Constitutional: She appears well-developed. Frail HENT: Normocephalic and atraumatic.  Eyes: EOM are normal.  No discharge.  Neck: CTS # 6.  Cardiovascular: Irregularly irregular.  No JVD. Respiratory: Effort normal. She has decreased breath sounds.  Right lower lateral chest with Pleurex catheter in place with dry dressing.  GI: Mildly distended. Bowel sounds are normal.  Musculoskeletal: No edema or tenderness in extremities Neurological: She is alert and oriented  Delayed and slow speech, improving.  She was able to follow basic motor commands.  Motor: Grossly 4/5 throughout, stable Skin: Skin is warm and dry.  Psychiatric: Her affect is blunt. Her speech is delayed. She is slowedand withdrawn.   Assessment/Plan: 1. Functional deficits secondary to debility which require 3+ hours per day of interdisciplinary therapy in a comprehensive inpatient rehab setting.  Physiatrist is providing close team  supervision and 24 hour management of active medical problems listed below.  Physiatrist and rehab team continue to assess barriers to discharge/monitor patient progress toward functional and medical goals  Care Tool:  Bathing    Body parts bathed by patient: Right arm, Left arm, Front perineal area   Body parts bathed by helper: Buttocks Body parts n/a: (pt refused to bath any other body parts)   Bathing assist Assist Level: Moderate Assistance - Patient 50 - 74%     Upper Body Dressing/Undressing Upper body dressing   What is the patient wearing?: Pull over shirt    Upper body assist Assist Level: Maximal Assistance - Patient 25 - 49%    Lower Body Dressing/Undressing Lower body dressing      What is the patient wearing?: Incontinence brief, Pants     Lower body assist Assist for lower body dressing: Maximal Assistance - Patient 25 - 49%     Toileting Toileting Toileting Activity did not occur (Clothing management and hygiene only): N/A (no void or bm)  Toileting assist Assist for toileting: Moderate Assistance - Patient 50 - 74%     Transfers Chair/bed transfer  Transfers assist     Chair/bed transfer assist level: Maximal Assistance - Patient 25 - 49%     Locomotion Ambulation   Ambulation assist   Ambulation activity did not occur: Safety/medical concerns  Assist level: Moderate Assistance - Patient 50 - 74% Assistive device: Walker-rolling Max distance: 10  Walk 10 feet activity   Assist     Assist level: Moderate Assistance - Patient - 50 - 74% Assistive device: Walker-rolling   Walk 50 feet activity   Assist Walk 50 feet with 2 turns activity did not occur: Safety/medical concerns         Walk 150 feet activity   Assist Walk 150 feet activity did not occur: Safety/medical concerns         Walk 10 feet on uneven surface  activity   Assist Walk 10 feet on uneven surfaces activity did not occur: Safety/medical  concerns         Wheelchair     Assist   Type of Wheelchair: Manual    Wheelchair assist level: Moderate Assistance - Patient 50 - 74% Max wheelchair distance: 75    Wheelchair 50 feet with 2 turns activity    Assist    Wheelchair 50 feet with 2 turns activity did not occur: Safety/medical concerns   Assist Level: Moderate Assistance - Patient 50 - 74%   Wheelchair 150 feet activity     Assist Wheelchair 150 feet activity did not occur: Safety/medical concerns          Medical Problem List and Plan: 1. Limitation in self-care, endurance, dysphagia, and mobility secondary to debility.             Continue CIR 2. DVT Prophylaxis/Anticoagulation: Mechanical: Sequential compression devices, below knee Bilateral lower extremities.  Lovenox started on 10/1.             Dopplers negative for DVT 3. Pain Management: Tylenol prn 4. Mood: LCSW to follow for evaluation and support.  5. Neuropsych: This patient is not fully capable of making decisions on her own behalf. 6. Skin/Wound Care: Routine pressure relief measures. Maintain adequate nutritional hydration status. Added supplements 3 times daily. 7. Fluids/Electrolytes/Nutrition: Monitor I's and O's.              Dysphagia 2 thins, advance diet as tolerated 8. Acute on chronic respiratory failure: Question history of OSA.              Keep plugged and monitor tolerance as well as O2 sats 9. Right pleural effusion: Right Pleurx drain placed 09/17 by radiology. Drain Pleurx daily-monitor for any signs of bleeding.  10. Chronic systolic CHF: Monitor weights daily for trends and watch for other signs of overload. On spironolactone, lasix and low dose ASA. No statin due to cirrhosis.                    Filed Weights   12/14/17 0534 12/15/17 0500 12/16/17 0625  Weight: 69.1 kg 69.8 kg 70.3 kg              Stable on 10/4 11. Chronic A fib: Monitor heart rate twice daily--resumed low dose BB due to  tachycardia. Was on Xarelto at home due to embolic stroke             Will consider restarting, after decannulation             Monitor heart rate with increased mobility 12. CAD s/p CABG/ICM: Will need ICD in the future.  13. H/o R-MCA stroke: Has been off Xarelto since June per husband/records. Continue low dose ASA.              Will consider restarting, after decannulation 14. H/o depression: Stable on paxil. Also on modafinil for activation.  15. GIB with hematochezia due to  esophagitis: On PPI bid.             Hemoglobin 9.0 on 10/2.             Cont to monitor 16. Cirrhosis of the liver/Hepatic encephalopathy: Continue Rifaximin (off lactulose)             Ammonia remains elevated at 36 on 10/1 17. T2DM: Hgb A1C- 5.5. Her intake variable therefore will monitor BS ac/hs and use SSI for now.              ?  Trending up on 10/3 18. Constipation: No BM since 9/24 per nursing. Increased miralax to bid. May need to resume lactulose. 19. Code status: Husband wants full code now that patient is getting better.  20. Hyponatremia             Sodium 129 on 10/3             Continue to monitor 21. AKI             Creatinine 1.18 on 10/3             Encourage fluids   LOS: 4 days A FACE TO FACE EVALUATION WAS PERFORMED  Glenda Walker Karis Juba 12/16/2017, 8:45 AM

## 2017-12-16 NOTE — Progress Notes (Signed)
Occupational Therapy Session Note  Patient Details  Name: Glenda Walker MRN: [id number] Date of Birth: Apr 21, 1955  Today's Date: 12/16/2017 OT Individual Time: 9147-8295 OT Individual Time Calculation (min): 54 min    Short Term Goals: Week 1:  OT Short Term Goal 1 (Week 1): Pt will complete Ub bathing and dressing with supervision. OT Short Term Goal 2 (Week 1): Pt will complete LB bathing sit to stand with min assist. OT Short Term Goal 3 (Week 1): Pt will complete LB dressing with mod assist sit to stand for 2 consecutive sessions. OT Short Term Goal 4 (Week 1): Pt will complete toilet transfers with mod assist stand pivot RW to 3:1 OT Short Term Goal 5 (Week 1): Pt will maintain sustained attention during selfcare tasks for 15 mins with no more than mod instructional cueing for re-direction.  Skilled Therapeutic Interventions/Progress Updates:    Pt completed wheelchair mobility from her room to just outside of the ADL apartment with overall min assist.  She would at times stop pushing and then demonstrate difficulty finding the wheel on the left side to place her hand to push.  Min assist from therapist to help guide her back to the wheel.  Pt still with increased lean to the left when pushing as well as forward neck flexion and head tilt to the left side.  Pt tearful on one occasion and when asked what was causing her to cry she could not state anything that made sense relative to the situation.  Once in the apartment had her practice stand pivot transfer to the couch with min assist and mod instructional cueing for hand placement.  She then ambulated from the couch to the elevated toilet in the bathroom with min assist.  She needed mod assist for clothing management and toilet hygiene.  Therapist also assisted with donning new brief as the current one was soiled.  Had her transfer back to the wheelchair and then therapist helped her return to the room.  She was then able to transfer to the  bed from the wheelchair with min assist to complete session.  Pt was oriented to place and day of week when asked, but still demonstrates delayed processing and response to questions most of the time.  Call button and phone in reach with bed alarm in place and continuous pulse ox on.    Therapy Documentation Precautions:  Precautions Precautions: Fall Precaution Comments: trach Restrictions Weight Bearing Restrictions: No  Vital Signs: Therapy Vitals Temp: 97.8 F (36.6 C) Temp Source: Oral Pulse Rate: 91 Resp: 20 BP: 113/83 Patient Position (if appropriate): Lying Oxygen Therapy SpO2: 99 % O2 Device: Room Air Pulse Oximetry Type: Intermittent Pain: Pain Assessment Pain Scale: Faces Pain Score: 0-No pain Faces Pain Scale: Hurts a little bit Pain Type: Acute pain Pain Location: Flank Pain Orientation: Left Pain Onset: With Activity Pain Intervention(s): Repositioned  Therapy/Group: Individual Therapy  Aime Meloche OTR/L 12/16/2017, 3:19 PM

## 2017-12-16 NOTE — Progress Notes (Deleted)
Glenda Walker PHYSICAL MEDICINE & REHABILITATION     PROGRESS NOTE  Subjective/Complaints:  Patient seen laying in bed this AM.  She slept well overnight, confirmed by sleep chart.  Husband not at bedside this AM.  She denies issues with plugging  ROS: Denies CP, SOB, nausea, vomiting, diarrhea.  Objective: Vital Signs: Blood pressure 121/86, pulse 86, temperature 98.4 F (36.9 C), temperature source Oral, resp. rate 20, weight 70.3 kg, SpO2 100 %. No results found. No results for input(s): WBC, HGB, HCT, PLT in the last 72 hours. Recent Labs    12/15/17 0454  NA 129*  K 4.9  CL 97*  GLUCOSE 151*  BUN 29*  CREATININE 1.18*  CALCIUM 9.9   CBG (last 3)  Recent Labs    12/15/17 1638 12/15/17 2343 12/16/17 0640  GLUCAP 145* 142* 111*    Wt Readings from Last 3 Encounters:  12/16/17 70.3 kg  12/10/17 73.6 kg    Physical Exam:  BP 121/86 (BP Location: Right Arm)   Pulse 86   Temp 98.4 F (36.9 C) (Oral)   Resp 20   Wt 70.3 kg   LMP  (LMP Unknown)   SpO2 100%   BMI 25.01 kg/m  Constitutional: She appears well-developed. Frail  HENT: Normocephalic and atraumatic.  Eyes: EOM are normal.  No discharge.  Neck: CTS # 6.   Cardiovascular: Irregularly irregular.  No JVD. Respiratory: Effort normal. She has decreased breath sounds.  Right lower lateral chest with Pleurex catheter in place with dry dressing.   GI:  Mildly distended. Bowel sounds are normal.  Musculoskeletal: No edema or tenderness in extremities  Neurological: She is alert and oriented  Delayed and slow speech, improving.  She was able to follow basic motor commands.  Motor: Grossly 4/5 throughout, stable  Skin: Skin is warm and dry.  Psychiatric: Her affect is blunt. Her speech is delayed. She is slowed and withdrawn.   Assessment/Plan: 1. Functional deficits secondary to debility which require 3+ hours per day of interdisciplinary therapy in a comprehensive inpatient rehab setting. Physiatrist is  providing close team supervision and 24 hour management of active medical problems listed below. Physiatrist and rehab team continue to assess barriers to discharge/monitor patient progress toward functional and medical goals.  Function:  Bathing Bathing position      Bathing parts      Bathing assist        Upper Body Dressing/Undressing Upper body dressing                    Upper body assist        Lower Body Dressing/Undressing Lower body dressing                                  Lower body assist        Toileting Toileting     Toileting steps completed by helper: Adjust clothing prior to toileting, Performs perineal hygiene, Adjust clothing after toileting    Toileting assist Assist level: Two helpers   Transfers Chair/bed Optician, dispensing          Cognition Comprehension Comprehension assist level: Follows basic conversation/direction with extra time/assistive device  Expression Expression assist level: Expresses basic needs/ideas: With extra time/assistive device  Social Interaction Social Interaction assist  level: Interacts appropriately 75 - 89% of the time - Needs redirection for appropriate language or to initiate interaction.  Problem Solving    Memory      Medical Problem List and Plan: 1.  Limitation in self-care, endurance, dysphagia, and mobility secondary to debility.  Continue CIR 2.  DVT Prophylaxis/Anticoagulation: Mechanical: Sequential compression devices, below knee Bilateral lower extremities.  Lovenox started on 10/1.  Dopplers negative for DVT 3. Pain Management: Tylenol prn 4. Mood: LCSW to follow for evaluation and support.  5. Neuropsych: This patient is not fully capable of making decisions on her own behalf. 6. Skin/Wound Care: Routine pressure relief measures.  Maintain adequate nutritional hydration status.  Added supplements 3 times daily. 7.  Fluids/Electrolytes/Nutrition: Monitor I's and O's.    Dysphagia 2 thins, advance diet as tolerated 8.  Acute on chronic respiratory failure: Question history of OSA.   Keep plugged and monitor tolerance as well as O2 sats 9.  Right pleural effusion: Right Pleurx drain placed 09/17 by radiology.  Drain Pleurx daily-monitor for any signs of bleeding.  10.  Chronic systolic CHF: Monitor weights daily for trends and watch for other signs of overload. On spironolactone, lasix and low dose ASA. No statin due to cirrhosis.    Filed Weights   12/14/17 0534 12/15/17 0500 12/16/17 0625  Weight: 69.1 kg 69.8 kg 70.3 kg   Stable on 10/4 11.  Chronic A fib: Monitor heart rate twice daily--resumed low dose BB due to tachycardia.  Was on Xarelto at home due to embolic stroke  Will consider restarting, after decannulation  Monitor heart rate with increased mobility 12. CAD s/p CABG/ICM: Will need ICD in the future.  13. H/o R-MCA stroke: Has been off Xarelto since June per husband/records. Continue low dose ASA.    Will consider restarting, after decannulation 14. H/o depression:  Stable on paxil. Also on modafinil for activation.  15. GIB with hematochezia due to esophagitis: On PPI bid.  Hemoglobin 9.0 on 10/2.  Cont to monitor 16. Cirrhosis of the liver/Hepatic encephalopathy: Continue Rifaximin (off lactulose)  Ammonia remains elevated at 36 on 10/1 17. T2DM: Hgb A1C- 5.5. Her intake variable therefore will monitor BS ac/hs and use SSI for now.   ?  Trending up on 10/3 18. Constipation: No BM since 9/24 per nursing. Increased miralax to bid. May need to resume lactulose. 19. Code status: Husband wants full code now that patient is getting better.  20. Hyponatremia  Sodium 129 on 10/3  Continue to monitor 21. AKI  Creatinine 1.18 on 10/3  Encourage fluids  LOS (Days) 4 A FACE TO FACE EVALUATION WAS PERFORMED  Glenda Walker Glenda Walker 12/16/2017 8:27 AM

## 2017-12-16 NOTE — Progress Notes (Signed)
Speech Language Pathology Daily Session Note  Patient Details  Name: Glenda Walker MRN: [id number] Date of Birth: 1955/12/24  Today's Date: 12/16/2017 SLP Individual Time: 0904-1000 SLP Individual Time Calculation (min): 56 min  Short Term Goals: Week 1: SLP Short Term Goal 1 (Week 1): Pt will consume therapeutic trials of dys 3 textures and thin liquids with mod I use of swallowing precautions and minimal overt s/s of aspiration prior to diet advancement.  SLP Short Term Goal 2 (Week 1): Pt will sustain her attention to basic, familiar tasks for 5 minute intervals with min cues for redirection.    Skilled Therapeutic Interventions:  Pt was seen for skilled ST targeting cognitive and dysphagia goals.  Pt was in bed asleep upon therapist's arrival with meal tray in front of her, mostly untouched.  Pt awakened easily and reported that she wanted to continue eating breakfast.  Pt was transferred to wheelchair via Stedy lift to maximize attention and alertness for safe PO intake.  Pt consume dys 2 textures and thin liquids with mod I use of swallowing precautions.  Pt had one instance of coughing during meal which did not appear to be related to PO intake.  After completion of meal, SLP facilitated the session with a basic, novel card game to address attention goals.  Pt was able to sustain her attention to task for ~3 minute intervals with min-mod cues for redirection.  As pt fatigued towards the end of today's therapy session, therapist needed to increase cues for redirection to task up to max assist verbal cues.  Pt was left in wheelchair at the end of today's therapy session with chair alarm set and call bell within reach.  Continue per current plan of care.      Pain Pain Assessment Pain Scale: 0-10 Pain Score: 0-No pain  Therapy/Group: Individual Therapy  Creston Klas, Melanee Spry 12/16/2017, 12:13 PM

## 2017-12-16 NOTE — Progress Notes (Signed)
Physical Therapy Session Note  Patient Details  Name: Glenda Walker MRN: [id number] Date of Birth: [birth year]  Today's Date: 12/16/2017 PT Individual Time:1000-1100 AND 1700-1730   60 min AND 30 min   Short Term Goals: Week 1:  PT Short Term Goal 1 (Week 1): pt will perform functional transfers with mod A PT Short Term Goal 2 (Week 1): pt will perform gait with max A x 10' in controlled environment  Skilled Therapeutic Interventions/Progress Updates:  Session 1. Pt received sitting in WC and agreeable to PT  WC mobility instructed by PT x 153f with supervision assist and moderate cues for safety, and use of BUE to prevent veer to the L.   Sit<>stand transfer training min-mod assist from PT. X 5 with moderate cues for proper UE placement and anterior weight shift. Prolonged reset break between bouts due to fatigue and poor attention to task  Gait training instructed by PT with min assist overall, 2 x 21fwith UE support on RW. vitals assessed following each bout of gait training HR 86-89, SpO2 95-100%.   Kinetron reciprocal movement training 5 x 30sec with 1 min rest break between bouts. Pt noted to have difficulty initiating task and sustaining >20sec without assist from PT.   [facility] and all needs met.   Session 2.  Pt received supine in bed and agreeable to PT.PT instructed pt in supine therex: SAQ, bridge, hip abduction, hip adduction squeeze, ankle DF/PF, SLR. All completed x 10 BLE with moderate cues from PT for proper speed and ROM tom maximize strengthening aspect of movement.   Pt left in bed with all needs met.        Therapy Documentation Precautions:  Precautions Precautions: Fall Precaution Comments: trach Restrictions Weight Bearing Restrictions: No Pain:   denies    Therapy/Group: Individual Therapy  AuLorie Phenix0/06/2017, 10:33 AM

## 2017-12-17 ENCOUNTER — Inpatient Hospital Stay (HOSPITAL_COMMUNITY): Payer: Medicare Other | Admitting: Speech Pathology

## 2017-12-17 ENCOUNTER — Inpatient Hospital Stay (HOSPITAL_COMMUNITY): Payer: Medicare Other | Admitting: Physical Therapy

## 2017-12-17 ENCOUNTER — Inpatient Hospital Stay (HOSPITAL_COMMUNITY): Payer: Medicare Other

## 2017-12-17 LAB — GLUCOSE, CAPILLARY
GLUCOSE-CAPILLARY: 124 mg/dL — AB (ref 70–99)
GLUCOSE-CAPILLARY: 106 mg/dL — AB (ref 70–99)
GLUCOSE-CAPILLARY: 117 mg/dL — AB (ref 70–99)
Glucose-Capillary: 128 mg/dL — ABNORMAL HIGH (ref 70–99)

## 2017-12-17 NOTE — Progress Notes (Signed)
[place] PHYSICAL MEDICINE & REHABILITATION PROGRESS NOTE  Subjective/Complaints:  Patient seen laying in bed this morning.  Husband at bedside.  Patient husband states that patient slept well overnight, per sleep chart patient slept fair overnight.  ROS:  Denies CP, SOB, nausea, vomiting, diarrhea  Objective: Vital Signs: Blood pressure 104/74, pulse 88, temperature 98.7 F (37.1 C), temperature source Oral, resp. rate 20, weight 69.3 kg, SpO2 100 %. No results found. No results for input(s): WBC, HGB, HCT, PLT in the last 72 hours. Recent Labs    12/15/17 0454  NA 129*  K 4.9  CL 97*  CO2 25  GLUCOSE 151*  BUN 29*  CREATININE 1.18*  CALCIUM 9.9    Physical Exam: Today's Vitals   12/17/17 0407 12/17/17 0520 12/17/17 0800 12/17/17 1140  BP:  104/74    Pulse: 85 76 89 88  Resp: 18 (!) 24 18 20   Temp:  98.7 F (37.1 C)    TempSrc:  Oral    SpO2: 100% 99% 99% 100%  Weight:  69.3 kg    PainSc:  0-No pain     Body mass index is 24.66 kg/m.  Constitutional: She appears well-developed. Frail HENT: Normocephalic and atraumatic.  Eyes: EOM are normal.  No discharge.  Neck: CTS # 6.  Cardiovascular:  Irregularly irregular.  No JVD. Respiratory: Effort normal.  She has  decreased breath sounds.  Right lower lateral chest with Pleurex catheter in place with dry dressing.  GI: Mildly distended. Bowel sounds are normal, stable.  Musculoskeletal: No edema or tenderness in extremities Neurological: She is alert and oriented  Delayed and slow speech,  improving.  She was able to follow basic motor commands.  Motor: Grossly 4/5 throughout,  stable Skin: Skin is warm and dry.  Psychiatric: Her affect is blunt. Her speech is delayed. She is slowedand withdrawn.   Assessment/Plan: 1. Functional deficits secondary to debility which require 3+ hours per day of interdisciplinary therapy in a comprehensive inpatient rehab setting.  Physiatrist is providing close team  supervision and 24 hour management of active medical problems listed below.  Physiatrist and rehab team continue to assess barriers to discharge/monitor patient progress toward functional and medical goals  Care Tool:  Bathing    Body parts bathed by patient: Right arm, Left arm, Front perineal area   Body parts bathed by helper: Buttocks Body parts n/a: (pt refused to bath any other body parts)   Bathing assist Assist Level: Moderate Assistance - Patient 50 - 74%     Upper Body Dressing/Undressing Upper body dressing   What is the patient wearing?: Pull over shirt    Upper body assist Assist Level: Maximal Assistance - Patient 25 - 49%    Lower Body Dressing/Undressing Lower body dressing      What is the patient wearing?: Incontinence brief, Pants     Lower body assist Assist for lower body dressing: Maximal Assistance - Patient 25 - 49%     Toileting Toileting Toileting Activity did not occur (Clothing management and hygiene only): N/A (no void or bm)  Toileting assist Assist for toileting: Moderate Assistance - Patient 50 - 74%     Transfers Chair/bed transfer  Transfers assist     Chair/bed transfer assist level: Minimal Assistance - Patient > 75%     Locomotion Ambulation   Ambulation assist   Ambulation activity did not occur: Safety/medical concerns  Assist level: Minimal Assistance - Patient > 75% Assistive device: Walker-rolling Max distance: 10  Walk 10 feet activity   Assist     Assist level: Moderate Assistance - Patient - 50 - 74% Assistive device: Walker-rolling   Walk 50 feet activity   Assist Walk 50 feet with 2 turns activity did not occur: Safety/medical concerns         Walk 150 feet activity   Assist Walk 150 feet activity did not occur: Safety/medical concerns         Walk 10 feet on uneven surface  activity   Assist Walk 10 feet on uneven surfaces activity did not occur: Safety/medical concerns          Wheelchair     Assist   Type of Wheelchair: Manual    Wheelchair assist level: Moderate Assistance - Patient 50 - 74% Max wheelchair distance: 75    Wheelchair 50 feet with 2 turns activity    Assist    Wheelchair 50 feet with 2 turns activity did not occur: Safety/medical concerns   Assist Level: Moderate Assistance - Patient 50 - 74%   Wheelchair 150 feet activity     Assist Wheelchair 150 feet activity did not occur: Safety/medical concerns          Medical Problem List and Plan: 1. Limitation in self-care, endurance, dysphagia, and mobility secondary to debility.             Continue CIR 2. DVT Prophylaxis/Anticoagulation: Mechanical: Sequential compression devices, below knee Bilateral lower extremities.  Lovenox started on 10/1.             Dopplers negative for DVT 3. Pain Management: Tylenol prn 4. Mood: LCSW to follow for evaluation and support.  5. Neuropsych: This patient is not fully capable of making decisions on her own behalf. 6. Skin/Wound Care: Routine pressure relief measures. Maintain adequate nutritional hydration status. Added supplements 3 times daily. 7. Fluids/Electrolytes/Nutrition: Monitor I's and O's.              Dysphagia 2 thins, advance diet as tolerated 8. Acute on chronic respiratory failure: Question history of OSA.              Keep plugged and monitor tolerance as well as O2 sats, tolerating will consider decannulation Monday 9. Right pleural effusion: Right Pleurx drain placed 09/17 by radiology. Drain Pleurx daily-monitor for any signs of bleeding.  10. Chronic systolic CHF: Monitor weights daily for trends and watch for other signs of overload. On spironolactone, lasix and low dose ASA. No statin due to cirrhosis.  Filed Weights   12/15/17 0500 12/16/17 0625 12/17/17 0520  Weight: 69.8 kg 70.3 kg 69.3 kg               Stable on 10/5 11. Chronic A fib: Monitor heart rate twice daily--resumed low dose BB due  to tachycardia. Was on Xarelto at home due to embolic stroke             Will consider restarting, after decannulation             Monitor heart rate with increased mobility 12. CAD s/p CABG/ICM: Will need ICD in the future.  13. H/o R-MCA stroke: Has been off Xarelto since June per husband/records. Continue low dose ASA.              Will consider restarting, after decannulation 14. H/o depression: Stable on paxil. Also on modafinil for activation.  15. GIB with hematochezia due to esophagitis: On PPI bid.  Hemoglobin 9.0 on 10/2.             Cont to monitor 16. Cirrhosis of the liver/Hepatic encephalopathy: Continue Rifaximin (off lactulose)             Ammonia remains elevated at 36 on 10/1 17. T2DM: Hgb A1C- 5.5. Her intake variable therefore will monitor BS ac/hs and use SSI for now.              Relatively controlled on 10/5 18. Constipation: No BM since 9/24 per nursing. Increased miralax to bid. May need to resume lactulose. 19. Code status: Husband wants full code now that patient is getting better.  20. Hyponatremia             Sodium 129 on 10/3  Labs ordered for Monday             Continue to monitor 21. AKI             Creatinine 1.18 on 10/3  Labs ordered for Monday             Encourage fluids   LOS: 5 days A FACE TO FACE EVALUATION WAS PERFORMED  Ankit Karis Juba 12/17/2017, 12:07 PM

## 2017-12-17 NOTE — Progress Notes (Signed)
Physical Therapy Session Note  Patient Details  Name: Glenda Walker MRN: [id number] Date of Birth: 08-11-1955  Today's Date: 12/17/2017 PT Individual Time: 1605-1700 PT Individual Time Calculation (min): 55 min   Short Term Goals: Week 1:  PT Short Term Goal 1 (Week 1): pt will perform functional transfers with mod A PT Short Term Goal 2 (Week 1): pt will perform gait with max A x 10' in controlled environment  Skilled Therapeutic Interventions/Progress Updates:   Pt received supine in bed and agreeable to PT. Supine>sit transfer with supervisoin assist and moderate cues for sequencing.   Stand pivot transfer to the R with RW and mod assist to power up into standing from EOB. Pt transported to day room.   Gait training with RW 74f with min assist +363fwith min assist. Mod multimodal cues for safety, improved attention to task, and improved foot clearance Bil.   Stand pivot transfer to Nustep with min assist from PT. Nustep reciprocal movement training 2 min x 5 bouts with max cues for attention to task, full ROM Bil, and improved breathing. Pt's SpO2 monitored throughout, and remained >95%.   BUE therex to perform chest press x 10 with 1# bar weight and overhead ball toss x 10 with moderate cues to attend to ball and full ROM with each rep.   [facility] to [facility] and all needs met.        Therapy Documentation Precautions:  Precautions Precautions: Fall Precaution Comments: trach Restrictions Weight Bearing Restrictions: No Vital Signs: Therapy Vitals Pulse Rate: 80 Resp: 16 Oxygen Therapy SpO2: 100 % O2 Device: Room Air Pain: denies   Therapy/Group: Individual Therapy  AuLorie Phenix0/07/2017, 5:10 PM

## 2017-12-17 NOTE — Progress Notes (Signed)
Occupational Therapy Session Note  Patient Details  Name: Glenda Walker MRN: [id number] Date of Birth: [birth year]  Today's Date: 12/17/2017 OT Individual Time: 4098-1191 OT Individual Time Calculation (min): 86 min    Short Term Goals: Week 1:  OT Short Term Goal 1 (Week 1): Pt will complete Ub bathing and dressing with supervision. OT Short Term Goal 2 (Week 1): Pt will complete LB bathing sit to stand with min assist. OT Short Term Goal 3 (Week 1): Pt will complete LB dressing with mod assist sit to stand for 2 consecutive sessions. OT Short Term Goal 4 (Week 1): Pt will complete toilet transfers with mod assist stand pivot RW to 3:1 OT Short Term Goal 5 (Week 1): Pt will maintain sustained attention during selfcare tasks for 15 mins with no more than mod instructional cueing for re-direction.  Skilled Therapeutic Interventions/Progress Updates:    1:1. No pain reported. Pt completes supine>sitting EOB with touching A. Pt completes stand pivot transfers throughout session with mod fading to min A with RW and VC for anterior weight shift in standing, RW management and facilitation of hand placement EOB>w/c<>BSC. Pt bathes at sink for UB with VC for sustained attention/sequencing bathing body parts. Pt easily becomes frustsrated with orienting shirt becoming tearful. Support and encouragement provided with A for orienting shirt, direct VC for hand placement threading LUE/head and A threading RUE. Pt able to thread BLE into pants and change socks by crossing into seated figure 4. Pt reports need to toilet and had B&B with A for posterior hygiene in standing. Pt able to complete clothing management with CGA. Exited session with pt set up with lunch tray, call light in reach and husband present in room   Therapy Documentation Precautions:  Precautions Precautions: Fall Precaution Comments: trach Restrictions Weight Bearing Restrictions: No   Other Treatments:     Therapy/Group:  Individual Therapy  Shon Hale 12/17/2017, 12:10 PM

## 2017-12-17 NOTE — Progress Notes (Signed)
Speech Language Pathology Daily Session Note  Patient Details  Name: Glenda Walker MRN: [id number] Date of Birth: [birth year]  Today's Date: 12/17/2017 SLP Individual Time: 1330-1410 SLP Individual Time Calculation (min): 40 min  Short Term Goals: Week 1: SLP Short Term Goal 1 (Week 1): Pt will consume therapeutic trials of dys 3 textures and thin liquids with mod I use of swallowing precautions and minimal overt s/s of aspiration prior to diet advancement.  SLP Short Term Goal 2 (Week 1): Pt will sustain her attention to basic, familiar tasks for 5 minute intervals with min cues for redirection.   SLP Short Term Goal 3 (Week 1): Pt will complete basic, familiar tasks wtih min assist cues for functional problem solving.  SLP Short Term Goal 4 (Week 1): Pt will recall basic, daily information with min cues for use of external aids.    Skilled Therapeutic Interventions: Skilled treatment session focused on cognitive goals. SLP facilitated session by providing more than a reasonable amount of time and Max A verbal cues for verbal initiation throughout a cognitive assessment. SLP initiated the [employer] and demonstrated deficits in orientation and attention. Task was not completed due to the amount of processing time and repetition needed. Suspect function impacted by fatigue (RN reported poor sleep last night). Verbal initiation improved minimally when engaging in basic conversation about family (grandchildern) despite encouragement from husband.  Trials were not attempted due to fatigue. Patient left upright in bed with alarm on and all needs within reach. Continue with current plan of care.       Pain No/Denies Pain   Therapy/Group: Individual Therapy  Yechezkel Fertig 12/17/2017, 3:05 PM

## 2017-12-18 DIAGNOSIS — D62 Acute posthemorrhagic anemia: Secondary | ICD-10-CM

## 2017-12-18 LAB — GLUCOSE, CAPILLARY
GLUCOSE-CAPILLARY: 109 mg/dL — AB (ref 70–99)
Glucose-Capillary: 130 mg/dL — ABNORMAL HIGH (ref 70–99)
Glucose-Capillary: 155 mg/dL — ABNORMAL HIGH (ref 70–99)
Glucose-Capillary: 117 mg/dL — ABNORMAL HIGH (ref 70–99)

## 2017-12-18 NOTE — Progress Notes (Signed)
[place] PHYSICAL MEDICINE & REHABILITATION PROGRESS NOTE  Subjective/Complaints:  Patient seen laying in bed this AM. Husband at bedside.  Patient slept well overnight.  No breathing issues.   ROS:  Denies CP, SOB, nausea, vomiting, diarrhea  Objective: Vital Signs: Blood pressure 118/87, pulse 89, temperature 98.1 F (36.7 C), temperature source Oral, resp. rate 17, weight 67.9 kg, SpO2 96 %. No results found. No results for input(s): WBC, HGB, HCT, PLT in the last 72 hours. No results for input(s): NA, K, CL, CO2, GLUCOSE, BUN, CREATININE, CALCIUM in the last 72 hours.  Physical Exam: Today's Vitals   12/18/17 0621 12/18/17 0757 12/18/17 1100 12/18/17 1110  BP:      Pulse:  86  89  Resp:  18  17  Temp:      TempSrc:      SpO2:  94%  96%  Weight: 67.9 kg     PainSc:   0-No pain    Body mass index is 24.16 kg/m.  Constitutional: She appears well-developed. Frail HENT: Normocephalic and atraumatic.  Eyes: EOM are normal.  No discharge.  Neck: CTS # 6.  Cardiovascular:  Irregularly irregular. No JVD. Respiratory: Effort normal.  She has  decreased breath sounds.  Right lower lateral chest with Pleurex catheter in place with dry dressing.  GI: Mildly distended. Bowel sounds are normal, stable.  Musculoskeletal: No edema or tenderness in extremities Neurological: She is alert and oriented  Delayed and slow speech,  improving.  She was able to follow basic motor commands.  Motor: Grossly 4/5 throughout,  stable Skin: Skin is warm and dry.  Psychiatric: Her affect is blunt. Her speech is delayed. She is slowedand withdrawn.   Assessment/Plan: 1. Functional deficits secondary to debility which require 3+ hours per day of interdisciplinary therapy in a comprehensive inpatient rehab setting.  Physiatrist is providing close team supervision and 24 hour management of active medical problems listed below.  Physiatrist and rehab team continue to assess barriers to  discharge/monitor patient progress toward functional and medical goals  Care Tool:  Bathing    Body parts bathed by patient: Right arm, Left arm, Front perineal area   Body parts bathed by helper: Buttocks Body parts n/a: (pt refused to bath any other body parts)   Bathing assist Assist Level: Moderate Assistance - Patient 50 - 74%     Upper Body Dressing/Undressing Upper body dressing   What is the patient wearing?: Pull over shirt    Upper body assist Assist Level: Maximal Assistance - Patient 25 - 49%    Lower Body Dressing/Undressing Lower body dressing      What is the patient wearing?: Incontinence brief, Pants     Lower body assist Assist for lower body dressing: Maximal Assistance - Patient 25 - 49%     Toileting Toileting Toileting Activity did not occur (Clothing management and hygiene only): N/A (no void or bm)  Toileting assist Assist for toileting: Moderate Assistance - Patient 50 - 74%     Transfers Chair/bed transfer  Transfers assist     Chair/bed transfer assist level: Moderate Assistance - Patient 50 - 74%     Locomotion Ambulation   Ambulation assist   Ambulation activity did not occur: Safety/medical concerns  Assist level: Minimal Assistance - Patient > 75% Assistive device: Walker-rolling Max distance: 10   Walk 10 feet activity   Assist     Assist level: Moderate Assistance - Patient - 50 - 74% Assistive device: Walker-rolling  Walk 50 feet activity   Assist Walk 50 feet with 2 turns activity did not occur: Safety/medical concerns         Walk 150 feet activity   Assist Walk 150 feet activity did not occur: Safety/medical concerns         Walk 10 feet on uneven surface  activity   Assist Walk 10 feet on uneven surfaces activity did not occur: Safety/medical concerns         Wheelchair     Assist   Type of Wheelchair: Manual    Wheelchair assist level: Moderate Assistance - Patient 50 -  74% Max wheelchair distance: 75    Wheelchair 50 feet with 2 turns activity    Assist    Wheelchair 50 feet with 2 turns activity did not occur: Safety/medical concerns   Assist Level: Moderate Assistance - Patient 50 - 74%   Wheelchair 150 feet activity     Assist Wheelchair 150 feet activity did not occur: Safety/medical concerns          Medical Problem List and Plan: 1. Limitation in self-care, endurance, dysphagia, and mobility secondary to debility.             Continue CIR 2. DVT Prophylaxis/Anticoagulation: Mechanical: Sequential compression devices, below knee Bilateral lower extremities.  Lovenox started on 10/1.             Dopplers negative for DVT 3. Pain Management: Tylenol prn 4. Mood: LCSW to follow for evaluation and support.  5. Neuropsych: This patient is not fully capable of making decisions on her own behalf. 6. Skin/Wound Care: Routine pressure relief measures. Maintain adequate nutritional hydration status. Added supplements 3 times daily. 7. Fluids/Electrolytes/Nutrition: Monitor I's and O's.              Dysphagia 2 thins, advance diet as tolerated 8. Acute on chronic respiratory failure: Question history of OSA.              Keep plugged and monitor tolerance as well as O2 sats, tolerating will consider decannulation tomorrow 9. Right pleural effusion: Right Pleurx drain placed 09/17 by radiology. Drain Pleurx daily-monitor for any signs of bleeding.  10. Chronic systolic CHF: Monitor weights daily for trends and watch for other signs of overload. On spironolactone, lasix and low dose ASA. No statin due to cirrhosis.  Filed Weights   12/16/17 0625 12/17/17 0520 12/18/17 0621  Weight: 70.3 kg 69.3 kg 67.9 kg               Stable on 10/6 11. Chronic A fib: Monitor heart rate twice daily--resumed low dose BB due to tachycardia. Was on Xarelto at home due to embolic stroke             Will consider restarting, after decannulation              Monitor heart rate with increased mobility 12. CAD s/p CABG/ICM: Will need ICD in the future.  13. H/o R-MCA stroke: Has been off Xarelto since June per husband/records. Continue low dose ASA.              Will consider restarting, after decannulation 14. H/o depression: Stable on paxil. Also on modafinil for activation.  15. GIB with hematochezia due to esophagitis: On PPI bid.             Hemoglobin 9.0 on 10/1.  Labs ordered for tomorrow             Cont  to monitor 16. Cirrhosis of the liver/Hepatic encephalopathy: Continue Rifaximin (off lactulose)             Ammonia remains elevated at 36 on 10/1 17. T2DM: Hgb A1C- 5.5. Her intake variable therefore will monitor BS ac/hs and use SSI for now.              Relatively controlled on 10/6 18. Constipation: No BM since 9/24 per nursing. Increased miralax to bid. May need to resume lactulose. 19. Code status: Husband wants full code now that patient is getting better.  20. Hyponatremia             Sodium 129 on 10/3  Labs ordered for tomorrow             Continue to monitor 21. AKI             Creatinine 1.18 on 10/3  Labs ordered for tomorrow             Encourage fluids   LOS: 6 days A FACE TO FACE EVALUATION WAS PERFORMED  Mahrukh Seguin Karis Juba 12/18/2017, 1:31 PM

## 2017-12-19 ENCOUNTER — Inpatient Hospital Stay (HOSPITAL_COMMUNITY): Payer: Medicare Other | Admitting: Speech Pathology

## 2017-12-19 ENCOUNTER — Inpatient Hospital Stay (HOSPITAL_COMMUNITY): Payer: Medicare Other | Admitting: Occupational Therapy

## 2017-12-19 ENCOUNTER — Inpatient Hospital Stay (HOSPITAL_COMMUNITY): Payer: Medicare Other | Admitting: Physical Therapy

## 2017-12-19 LAB — BASIC METABOLIC PANEL
Anion gap: 7 (ref 5–15)
BUN: 24 mg/dL — AB (ref 8–23)
CALCIUM: 10.1 mg/dL (ref 8.9–10.3)
CHLORIDE: 98 mmol/L (ref 98–111)
CO2: 24 mmol/L (ref 22–32)
CREATININE: 1.25 mg/dL — AB (ref 0.44–1.00)
GFR calc non Af Amer: 45 mL/min — ABNORMAL LOW (ref >60)
GFR, EST AFRICAN AMERICAN: 53 mL/min — AB (ref >60)
GLUCOSE: 124 mg/dL — AB (ref 70–99)
Potassium: 5 mmol/L (ref 3.5–5.1)
Sodium: 129 mmol/L — ABNORMAL LOW (ref 135–145)

## 2017-12-19 LAB — CBC
HCT: 25.7 % — ABNORMAL LOW (ref 36.0–46.0)
Hemoglobin: 7.8 g/dL — ABNORMAL LOW (ref 12.0–15.0)
MCH: 28.2 pg (ref 26.0–34.0)
MCHC: 30.4 g/dL (ref 30.0–36.0)
MCV: 92.8 fL (ref 78.0–100.0)
PLATELETS: 288 10*3/uL (ref 150–400)
RBC: 2.77 MIL/uL — ABNORMAL LOW (ref 3.87–5.11)
RDW: 18.9 % — AB (ref 11.5–15.5)
WBC: 6.9 10*3/uL (ref 4.0–10.5)

## 2017-12-19 LAB — GLUCOSE, CAPILLARY
GLUCOSE-CAPILLARY: 120 mg/dL — AB (ref 70–99)
Glucose-Capillary: 114 mg/dL — ABNORMAL HIGH (ref 70–99)
Glucose-Capillary: 144 mg/dL — ABNORMAL HIGH (ref 70–99)
Glucose-Capillary: 189 mg/dL — ABNORMAL HIGH (ref 70–99)

## 2017-12-19 MED ORDER — MODAFINIL 100 MG PO TABS
50.0000 mg | ORAL_TABLET | Freq: Every day | ORAL | Status: DC
  Administered 2017-12-19 – 2017-12-24 (×6): 50 mg via ORAL
  Administered 2017-12-25: 10:00:00 via ORAL
  Administered 2017-12-26 – 2017-12-27 (×2): 50 mg via ORAL
  Filled 2017-12-19 (×10): qty 1

## 2017-12-19 NOTE — Progress Notes (Signed)
Physical Therapy Session Note  Patient Details  Name: Glenda Walker MRN: [id number] Date of Birth: [birth year]  Today's Date: 12/19/2017 PT Individual Time: 4098-1191 PT Individual Time Calculation (min): 42 min   Short Term Goals: Week 1:  PT Short Term Goal 1 (Week 1): pt will perform functional transfers with mod A PT Short Term Goal 2 (Week 1): pt will perform gait with max A x 10' in controlled environment  Skilled Therapeutic Interventions/Progress Updates:  Pt presented in w/c sleeping but easily awoken and agreeable to therapy. Pt denies pain at current time. Pt transported to rehab gym for energy conservation. Performed gait training 26ft x 1 and 50ft x 1 with seated rest. Pt required cues for maintaining knee extension in stance phase, maintaining straight trajectory, and increasing BOS. SpO2 maintained >95% with ambulation. Participated in seated and standing LE therex with 2.5# cuff. Pt required mod cues for attention to task and completion of activity with all exercises. Performed LAQ, hamstring pulls with L1 resistance band, standing hip flexion, hip abd/add, mini squats x 15 bilaterally. Performed standing in Airex with RW. Pt required moderate cues for maintaining midline and required tactile cues for righting reactions. Pt ambulated additional 52ft to w/c and handed off to OT for next session.      Therapy Documentation Precautions:  Precautions Precautions: Fall Precaution Comments: trach Restrictions Weight Bearing Restrictions: No General:   Vital Signs: Therapy Vitals Temp: 99.1 F (37.3 C) Temp Source: Oral Pulse Rate: 81 Resp: 18 BP: 108/75 Patient Position (if appropriate): Sitting Oxygen Therapy SpO2: 100 % O2 Device: Room Air Pain: Pain Assessment Pain Scale: 0-10 Pain Score: 0-No pain    Therapy/Group: Individual Therapy  Emmalena Canny  Maxi Rodas, PTA  12/19/2017, 4:09 PM

## 2017-12-19 NOTE — Progress Notes (Signed)
Speech Language Pathology Daily Session Note  Patient Details  Name: Glenda Walker MRN: [id number] Date of Birth: Nov 23, 1955  Today's Date: 12/19/2017   Skilled treatment session #1 SLP Individual Time: 1100-1130 SLP Individual Time Calculation (min): 30 min   Skilled treatment session #2 SLP Individual Time: SLP Individual Time Calculation (min):   Short Term Goals: Week 1: SLP Short Term Goal 1 (Week 1): Pt will consume therapeutic trials of dys 3 textures and thin liquids with mod I use of swallowing precautions and minimal overt s/s of aspiration prior to diet advancement.  SLP Short Term Goal 2 (Week 1): Pt will sustain her attention to basic, familiar tasks for 5 minute intervals with min cues for redirection.   SLP Short Term Goal 3 (Week 1): Pt will complete basic, familiar tasks wtih min assist cues for functional problem solving.  SLP Short Term Goal 4 (Week 1): Pt will recall basic, daily information with min cues for use of external aids.    Skilled Therapeutic Interventions:  Skilled treatment session #1 focused on cognition goals. Despite pt's discouragement with overall progress as conveyed by her husband, pt was willing to participate in ST session. SLP facilitated session by providing more than a reasonable amount of time to count money and to perform simple change making activity with money. When verbally sequencing topics of interest - such as flowers- pt able to give instruction with accuracy and timeliness when describing how to split flowers and when to plant them. Pt with increased sustained attention for ~ 30 minutes without any cues. Pt was left in husband's care in dayroom.   Skilled treatment session #2 focused on dysphagia and cognition goals. SLP facilitated session by providing trial of advanced diet textures (fruit cup and solid graham crackers). Pt with self-imposed slow rate and small bite size. Overall good mastication and complete oral clearing. Given  pt's overall report of fatigue by other disciplines, would recommend full tray of advanced textures prior to upgrading diet. During second session, pt continued to demonstrate good sustained attention for ~ 30 minutes. Pt was left upright in wheelchair with husband present. Continue per current plan of care.      Function:  Pain Pain Assessment 0-10 Pain Score: 0-No pain  Therapy/Group: Individual Therapy  Leeanna Slaby 12/19/2017, 11:54 AM

## 2017-12-19 NOTE — Progress Notes (Signed)
Occupational Therapy Session Note  Patient Details  Name: Glenda Walker MRN: [id number] Date of Birth: 20-Oct-1955  Today's Date: 12/19/2017 OT Individual Time: 1015-1100 OT Individual Time Calculation (min): 45 min    Short Term Goals: Week 1:  OT Short Term Goal 1 (Week 1): Pt will complete Ub bathing and dressing with supervision. OT Short Term Goal 2 (Week 1): Pt will complete LB bathing sit to stand with min assist. OT Short Term Goal 3 (Week 1): Pt will complete LB dressing with mod assist sit to stand for 2 consecutive sessions. OT Short Term Goal 4 (Week 1): Pt will complete toilet transfers with mod assist stand pivot RW to 3:1 OT Short Term Goal 5 (Week 1): Pt will maintain sustained attention during selfcare tasks for 15 mins with no more than mod instructional cueing for re-direction.      Skilled Therapeutic Interventions/Progress Updates:    Pt received in w/c with spouse pushing her up and down the hallway.  Spouse brought her back to the room and asked pt if she would like to bathe.  Pt with minimal verbal communication (purposeful) with this therapist in relation to self care, but did talk about her grand kids.  Pt did require mod cues through out the session to attend to the the tasks and to participate.   She did doff her shirt without A, mod cuing to attend to task with UB bathing, and min A to set up her shirt and guide her to complete putting L arm in shirt and then she could continue task.   Pt stood to RW with S and cued pt to pull pants down to wash LB.  Pt expressed frustration by closing eyes and turning head further away from therapist and spouse who was trying to encourage her. Pt stated she was clean and did not want to change her shorts.  Spouse did say they were just put on this am.  Pt sat down in frustration.  Reassured pt she can make these choices and cued her to stand and pull pants back up, which she did with S.    Numerous attempts to have pt walk to sink  to brush teeth.  Pt kept saying "I want to go home". Reassured pt she would go home she just needs to build strength.   Pt finally agreed to brushing her teeth but wanted to do so from the w/c.   She completed task and then chair alarm placed back on pt.  Pt's spouse took her back out in the hallway to give her a ride around.    Therapy Documentation Precautions:  Precautions Precautions: Fall Precaution Comments: trach Restrictions Weight Bearing Restrictions: No    Vital Signs: Therapy Vitals Pulse Rate: (!) 109 Resp: 17 BP: (!) 136/95 Patient Position (if appropriate): Lying Oxygen Therapy SpO2: 96 % O2 Device: Room Air FiO2 (%): 21 % Pain: Pain Assessment Pain Score: 0-No pain ADL: ADL Eating: Set up Where Assessed-Eating: Bed level Grooming: Minimal assistance Where Assessed-Grooming: Edge of bed Upper Body Bathing: Minimal assistance Where Assessed-Upper Body Bathing: Edge of bed Lower Body Bathing: Maximal assistance Where Assessed-Lower Body Bathing: Edge of bed Upper Body Dressing: Minimal assistance Where Assessed-Upper Body Dressing: Edge of bed Lower Body Dressing: Maximal assistance Where Assessed-Lower Body Dressing: Edge of bed Toileting: Maximal assistance   Therapy/Group: Individual Therapy  Latravis Grine 12/19/2017, 11:32 AM

## 2017-12-19 NOTE — Progress Notes (Signed)
Occupational Therapy Weekly Progress Note  Patient Details  Name: Glenda Walker MRN: [id number] Date of Birth: [birth year]  Beginning of progress report period: December 13, 2017 End of progress report period: December 19, 2017  Today's Date: 12/19/2017 OT Individual Time: 9201-0071 OT Individual Time Calculation (min): 56 min    Patient has met 2 of 5 short term goals.  Pt is making steady progress with OT.  Currently, she needs supervision for UB bathing with min assist for dressing.  Lb bathing is at a min assist level sit to stand as well with LB dressing at a mod assist.  Functional transfers with use of the RW are at a min assist level to the toilet as well as to the bed and wheelchair.  She continues to be limited by endurance as well as sustained attention.  During simple cognitive tasks she needs max instructional cueing to sequence and to fix errors, such as playing a simple game of checkers, sequencing bathing and dressing tasks, or replicating a 2 color 10 peg design.  She also needs max instructional cueing to maintain sustained attention.  Feel she will benefit from continued OT in order to increase cognition as well as for maintaining better attention during selfcare tasks and therapeutic activities.  Will continue with current OT POC with expected discharge 10/18.      Patient continues to demonstrate the following deficits: muscle weakness, decreased cardiorespiratoy endurance, decreased initiation, decreased attention, decreased awareness, decreased problem solving, decreased memory and delayed processing and decreased standing balance and decreased balance strategies and therefore will continue to benefit from skilled OT intervention to enhance overall performance with BADL and Reduce care partner burden.  Patient progressing toward long term goals..  Continue plan of care.  OT Short Term Goals Week 2:  OT Short Term Goal 1 (Week 2): Pt will maintain sustained attention during  selfcare tasks for 15 mins with no more than mod instructional cueing for re-direction. OT Short Term Goal 2 (Week 2): Pt will complete UB bathing and dressing with supervision. OT Short Term Goal 3 (Week 2): Pt will complete LB bathing sit to stand with min assist. OT Short Term Goal 4 (Week 2): Pt will complete toilet transfer ambulating to the elevated toilet with rail and RW with supervision.   Skilled Therapeutic Interventions/Progress Updates:    Pt worked on sit to stand and standing endurance in the gym at the high/low table while engaged in cognitive task of playing checkers.  She was able to stand for intervals of 4,6,8,7, and 6 minutes while engaged in task.  She needed max instructional cueing to sequence through playing game of checkers in order to move the correct way and not go backwards, and to take jumps when presented.  Pt with sit to stand with close supervision as well as standing balance when working this session.  Returned to room at end of session with call button and phone in reach and pt requesting to stay up in the bed.  Alarm belt in place as well.    Therapy Documentation Precautions:  Precautions Precautions: Fall Precaution Comments: trach Restrictions Weight Bearing Restrictions: No  Pain: Pain Assessment Pain Scale: 0-10 Pain Score: 0-No pain  Therapy/Group: Individual Therapy  Markeese Boyajian OTR/L 12/19/2017, 4:12 PM

## 2017-12-19 NOTE — Progress Notes (Signed)
PHYSICAL MEDICINE & REHABILITATION PROGRESS NOTE  Subjective/Complaints:  Seen sitting up in bed this morning.  She did not sleep well overnight.  Husband at bedside states that this is the first time he thinks it is because he gave her some food late at night.  They deny respiratory issues.  ROS:  Denies CP, SOB, nausea, vomiting, diarrhea  Objective: Vital Signs: Blood pressure 114/76, pulse 82, temperature 97.7 F (36.5 C), temperature source Oral, resp. rate 16, weight 68.3 kg, SpO2 99 %. No results found. Recent Labs    12/19/17 0403  WBC 6.9  HGB 7.8*  HCT 25.7*  PLT 288   Recent Labs    12/19/17 0403  NA 129*  K 5.0  CL 98  CO2 24  GLUCOSE 124*  BUN 24*  CREATININE 1.25*  CALCIUM 10.1    Physical Exam: Today's Vitals   12/19/17 0055 12/19/17 0404 12/19/17 0405 12/19/17 0500  BP:   114/76   Pulse: 82  83 82  Resp: 17  18 16   Temp:   97.7 F (36.5 C)   TempSrc:   Oral   SpO2: 98%  100% 99%  Weight:  68.3 kg    PainSc:       Body mass index is 24.3 kg/m.  Constitutional: She appears well-developed. Frail HENT: Normocephalic and atraumatic.  Eyes: EOM are normal.  No discharge.  Neck: CTS # 6.  Cardiovascular:  Irregularly irregular.  No JVD. Respiratory: Effort normal.  She has  decreased breath sounds.  Right lower lateral chest with Pleurex catheter in place with dry dressing.  GI: Mildly distended. Bowel sounds are normal, stable.  Musculoskeletal: No edema or tenderness in extremities Neurological: She is alert and oriented  Delayed and slow speech,  improving.  She was able to follow basic motor commands.  Motor: Grossly 4/5 throughout,  stable Skin: Skin is warm and dry.  Psychiatric: Her affect is blunt. Her speech is delayed. She is slowedand withdrawn.   Assessment/Plan: 1. Functional deficits secondary to debility which require 3+ hours per day of interdisciplinary therapy in a comprehensive inpatient rehab  setting.  Physiatrist is providing close team supervision and 24 hour management of active medical problems listed below.  Physiatrist and rehab team continue to assess barriers to discharge/monitor patient progress toward functional and medical goals  Care Tool:  Bathing    Body parts bathed by patient: Right arm, Left arm, Front perineal area   Body parts bathed by helper: Buttocks Body parts n/a: Chest, Abdomen, Front perineal area, Buttocks   Bathing assist Assist Level: Moderate Assistance - Patient 50 - 74%     Upper Body Dressing/Undressing Upper body dressing   What is the patient wearing?: Pull over shirt    Upper body assist Assist Level: Moderate Assistance - Patient 50 - 74%    Lower Body Dressing/Undressing Lower body dressing      What is the patient wearing?: Pants     Lower body assist Assist for lower body dressing: Moderate Assistance - Patient 50 - 74%     Toileting Toileting Toileting Activity did not occur (Clothing management and hygiene only): N/A (no void or bm)  Toileting assist Assist for toileting: Moderate Assistance - Patient 50 - 74%     Transfers Chair/bed transfer  Transfers assist     Chair/bed transfer assist level: Moderate Assistance - Patient 50 - 74%     Locomotion Ambulation   Ambulation assist   Ambulation activity did not  occur: Safety/medical concerns  Assist level: Minimal Assistance - Patient > 75% Assistive device: Walker-rolling Max distance: 10   Walk 10 feet activity   Assist     Assist level: Moderate Assistance - Patient - 50 - 74% Assistive device: Walker-rolling   Walk 50 feet activity   Assist Walk 50 feet with 2 turns activity did not occur: Safety/medical concerns         Walk 150 feet activity   Assist Walk 150 feet activity did not occur: Safety/medical concerns         Walk 10 feet on uneven surface  activity   Assist Walk 10 feet on uneven surfaces activity did not  occur: Safety/medical concerns         Wheelchair     Assist   Type of Wheelchair: Manual    Wheelchair assist level: Moderate Assistance - Patient 50 - 74% Max wheelchair distance: 75    Wheelchair 50 feet with 2 turns activity    Assist    Wheelchair 50 feet with 2 turns activity did not occur: Safety/medical concerns   Assist Level: Moderate Assistance - Patient 50 - 74%   Wheelchair 150 feet activity     Assist Wheelchair 150 feet activity did not occur: Safety/medical concerns          Medical Problem List and Plan: 1. Limitation in self-care, endurance, dysphagia, and mobility secondary to debility.             Continue CIR 2. DVT Prophylaxis/Anticoagulation: Mechanical: Sequential compression devices, below knee Bilateral lower extremities.  Lovenox started on 10/1.             Dopplers negative for DVT 3. Pain Management: Tylenol prn 4. Mood: LCSW to follow for evaluation and support.  5. Neuropsych: This patient is not fully capable of making decisions on her own behalf. 6. Skin/Wound Care: Routine pressure relief measures. Maintain adequate nutritional hydration status. Added supplements 3 times daily. 7. Fluids/Electrolytes/Nutrition: Monitor I's and O's.              Dysphagia 2 thins, advance diet as tolerated 8. Acute on chronic respiratory failure: Question history of OSA.              Is tolerated being plugged well for several days, O2 sats remained stable, will decannulate today  9. Right pleural effusion: Right Pleurx drain placed 09/17 by radiology. Drain Pleurx daily-monitor for any signs of bleeding.  10. Chronic systolic CHF: Monitor weights daily for trends and watch for other signs of overload. On spironolactone, lasix and low dose ASA. No statin due to cirrhosis.  Filed Weights   12/17/17 0520 12/18/17 0621 12/19/17 0404  Weight: 69.3 kg 67.9 kg 68.3 kg               Stable on 10/7 11. Chronic A fib: Monitor heart rate  twice daily--resumed low dose BB due to tachycardia. Was on Xarelto at home due to embolic stroke             Will consider restarting, after decannulation             Monitor heart rate with increased mobility 12. CAD s/p CABG/ICM: Will need ICD in the future.  13. H/o R-MCA stroke: Has been off Xarelto since June per husband/records. Continue low dose ASA.              Will consider restarting, after decannulation 14. H/o depression: Stable on paxil. Also on modafinil  for activation.  15. GIB with hematochezia due to esophagitis: On PPI bid.            Hemoglobin 7.8 on 10/7  Hemoccult ordered             Cont to monitor 16. Cirrhosis of the liver/Hepatic encephalopathy: Continue Rifaximin (off lactulose)             Ammonia remains elevated at 36 on 10/1 17. T2DM: Hgb A1C- 5.5. Her intake variable therefore will monitor BS ac/hs and use SSI for now.              Relatively controlled on 10/7 18. Constipation: No BM since 9/24 per nursing. Increased miralax to bid. May need to resume lactulose. 19. Code status: Husband wants full code now that patient is getting better.  20. Hyponatremia             Sodium 129 on 10/7             Continue to monitor 21. AKI             Creatinine 1.25 on 10/7             Encourage fluids   LOS: 7 days A FACE TO FACE EVALUATION WAS PERFORMED  Master Touchet Karis Juba 12/19/2017, 8:10 AM

## 2017-12-19 NOTE — Progress Notes (Signed)
Order placed for trach to be removed from patient.  Patient trach removed without complications.  Gauze placed over trach site and secured.  Sats 96%.  Patient tolerated well.

## 2017-12-20 ENCOUNTER — Inpatient Hospital Stay (HOSPITAL_COMMUNITY): Payer: Medicare Other | Admitting: Occupational Therapy

## 2017-12-20 ENCOUNTER — Inpatient Hospital Stay (HOSPITAL_COMMUNITY): Payer: Medicare Other | Admitting: Speech Pathology

## 2017-12-20 ENCOUNTER — Inpatient Hospital Stay (HOSPITAL_COMMUNITY): Payer: Medicare Other | Admitting: Physical Therapy

## 2017-12-20 DIAGNOSIS — R7309 Other abnormal glucose: Secondary | ICD-10-CM

## 2017-12-20 LAB — GLUCOSE, CAPILLARY
GLUCOSE-CAPILLARY: 116 mg/dL — AB (ref 70–99)
GLUCOSE-CAPILLARY: 138 mg/dL — AB (ref 70–99)
Glucose-Capillary: 116 mg/dL — ABNORMAL HIGH (ref 70–99)
Glucose-Capillary: 123 mg/dL — ABNORMAL HIGH (ref 70–99)

## 2017-12-20 LAB — OCCULT BLOOD X 1 CARD TO LAB, STOOL: Fecal Occult Bld: NEGATIVE

## 2017-12-20 MED ORDER — PANTOPRAZOLE SODIUM 40 MG PO TBEC
40.0000 mg | DELAYED_RELEASE_TABLET | Freq: Two times a day (BID) | ORAL | Status: DC
  Administered 2017-12-20 – 2017-12-27 (×14): 40 mg via ORAL
  Filled 2017-12-20 (×14): qty 1

## 2017-12-20 NOTE — Progress Notes (Signed)
[facility] PHYSICAL MEDICINE & REHABILITATION PROGRESS NOTE  Subjective/Complaints:  Patient seen laying in bed this morning.  She states she slept well overnight.  She states "feels good without the trach".  ROS:  Denies CP, SOB, nausea, vomiting, diarrhea  Objective: Vital Signs: Blood pressure 107/64, pulse 82, temperature 97.8 F (36.6 C), temperature source Oral, resp. rate 16, weight 68.9 kg, SpO2 99 %. No results found. Recent Labs    12/19/17 0403  WBC 6.9  HGB 7.8*  HCT 25.7*  PLT 288   Recent Labs    12/19/17 0403  NA 129*  K 5.0  CL 98  CO2 24  GLUCOSE 124*  BUN 24*  CREATININE 1.25*  CALCIUM 10.1    Physical Exam: Today's Vitals   12/19/17 1400 12/19/17 1606 12/19/17 2006 12/20/17 0440  BP:  108/75 121/72 107/64  Pulse:  81 79 82  Resp:  18 18 16   Temp:  99.1 F (37.3 C) 97.9 F (36.6 C) 97.8 F (36.6 C)  TempSrc:  Oral Oral Oral  SpO2:  100% 97% 99%  Weight:    68.9 kg  PainSc: 0-No pain  0-No pain    Body mass index is 24.52 kg/m.  Constitutional: She appears well-developed. Frail HENT: Normocephalic and atraumatic.  Eyes: EOM are normal.  No discharge.  Neck: Stoma dressed Cardiovascular:  Irregularly irregular.  No JVD. Respiratory: Effort normal.  Clear. Right lower lateral chest with Pleurex catheter in place with dry dressing.  GI: Mildly distended. Bowel sounds are normal, unchanged.  Musculoskeletal: No edema or tenderness in extremities Neurological: She is alert and oriented  Delayed and slow speech,  improving.  She was able to follow basic motor commands.  Motor: Grossly 4/5 throughout,  stable Skin: Skin is warm and dry.  Psychiatric: Her affect is blunt. Her speech is delayed. She is slowedand withdrawn, improving.   Assessment/Plan: 1. Functional deficits secondary to debility which require 3+ hours per day of interdisciplinary therapy in a comprehensive inpatient rehab setting.  Physiatrist is providing close  team supervision and 24 hour management of active medical problems listed below.  Physiatrist and rehab team continue to assess barriers to discharge/monitor patient progress toward functional and medical goals  Care Tool:  Bathing    Body parts bathed by patient: Right arm, Left arm, Chest, Abdomen, Face(UB only this am)   Body parts bathed by helper: Buttocks Body parts n/a: Chest, Abdomen, Front perineal area, Buttocks   Bathing assist Assist Level: Moderate Assistance - Patient 50 - 74%     Upper Body Dressing/Undressing Upper body dressing   What is the patient wearing?: Pull over shirt    Upper body assist Assist Level: Minimal Assistance - Patient > 75%    Lower Body Dressing/Undressing Lower body dressing      What is the patient wearing?: Pants     Lower body assist Assist for lower body dressing: Moderate Assistance - Patient 50 - 74%     Toileting Toileting Toileting Activity did not occur (Clothing management and hygiene only): N/A (no void or bm)  Toileting assist Assist for toileting: Moderate Assistance - Patient 50 - 74% Assistive Device Comment: rails   Transfers Chair/bed transfer  Transfers assist     Chair/bed transfer assist level: Moderate Assistance - Patient 50 - 74%     Locomotion Ambulation   Ambulation assist   Ambulation activity did not occur: Safety/medical concerns  Assist level: Minimal Assistance - Patient > 75% Assistive device: Walker-rolling Max  distance: 10   Walk 10 feet activity   Assist     Assist level: Moderate Assistance - Patient - 50 - 74% Assistive device: Walker-rolling   Walk 50 feet activity   Assist Walk 50 feet with 2 turns activity did not occur: Safety/medical concerns         Walk 150 feet activity   Assist Walk 150 feet activity did not occur: Safety/medical concerns         Walk 10 feet on uneven surface  activity   Assist Walk 10 feet on uneven surfaces activity did not  occur: Safety/medical concerns         Wheelchair     Assist   Type of Wheelchair: Manual    Wheelchair assist level: Moderate Assistance - Patient 50 - 74% Max wheelchair distance: 75    Wheelchair 50 feet with 2 turns activity    Assist    Wheelchair 50 feet with 2 turns activity did not occur: Safety/medical concerns   Assist Level: Moderate Assistance - Patient 50 - 74%   Wheelchair 150 feet activity     Assist Wheelchair 150 feet activity did not occur: Safety/medical concerns          Medical Problem List and Plan: 1. Limitation in self-care, endurance, dysphagia, and mobility secondary to debility.             Continue CIR 2. DVT Prophylaxis/Anticoagulation: Mechanical: Sequential compression devices, below knee Bilateral lower extremities.  Lovenox started on 10/1.             Dopplers negative for DVT 3. Pain Management: Tylenol prn 4. Mood: LCSW to follow for evaluation and support.  5. Neuropsych: This patient is not fully capable of making decisions on her own behalf. 6. Skin/Wound Care: Routine pressure relief measures. Maintain adequate nutritional hydration status. Added supplements 3 times daily. 7. Fluids/Electrolytes/Nutrition: Monitor I's and O's.              Dysphagia 2 thins, advance diet as tolerated 8. Acute on chronic respiratory failure: Question history of OSA.              Decannulated on 10/7 without issue 9. Right pleural effusion: Right Pleurx drain placed 09/17 by radiology. Drain Pleurx daily-monitor for any signs of bleeding.  10. Chronic systolic CHF: Monitor weights daily for trends and watch for other signs of overload. On spironolactone, lasix and low dose ASA. No statin due to cirrhosis.  Filed Weights   12/18/17 0621 12/19/17 0404 12/20/17 0440  Weight: 67.9 kg 68.3 kg 68.9 kg               Stable on 10/8 11. Chronic A fib: Monitor heart rate twice daily--resumed low dose BB due to tachycardia. Was on  Xarelto at home due to embolic stroke             Will consider restarting, after decannulation             Monitor heart rate with increased mobility 12. CAD s/p CABG/ICM: Will need ICD in the future.  13. H/o R-MCA stroke: Has been off Xarelto since June per husband/records. Continue low dose ASA.              Will consider restarting, after decannulation 14. H/o depression: Stable on paxil. Also on modafinil for activation.  15. GIB with hematochezia due to esophagitis: On PPI bid.            Hemoglobin 7.8 on  10/7  Hemoccult negative             Cont to monitor 16. Cirrhosis of the liver/Hepatic encephalopathy: Continue Rifaximin (off lactulose)             Ammonia remains elevated at 36 on 10/1 17. T2DM: Hgb A1C- 5.5. Her intake variable therefore will monitor BS ac/hs and use SSI for now.              Labile yesterday, otherwise relatively controlled 18. Constipation: No BM since 9/24 per nursing. Increased miralax to bid. May need to resume lactulose. 19. Code status: Husband wants full code now that patient is getting better.  20. Hyponatremia             Sodium 129 on 10/7             Continue to monitor 21. AKI             Creatinine 1.25 on 10/7             Encourage fluids   LOS: 8 days A FACE TO FACE EVALUATION WAS PERFORMED  Lexii Walsh Karis Juba 12/20/2017, 8:00 AM

## 2017-12-20 NOTE — Progress Notes (Signed)
Speech Language Pathology Weekly Progress and Session Note  Patient Details  Name: Glenda Walker MRN: [id number] Date of Birth: Jul 25, 1955  Beginning of progress report period:  December 13, 2017  End of progress report period: December 20, 2017   Today's Date: 12/20/2017 SLP Individual Time: 8119-1478, 1200-1220 SLP Individual Time Calculation (min): 35 min, 20 min  Short Term Goals: Week 1: SLP Short Term Goal 1 (Week 1): Pt will consume therapeutic trials of dys 3 textures and thin liquids with mod I use of swallowing precautions and minimal overt s/s of aspiration prior to diet advancement.  SLP Short Term Goal 1 - Progress (Week 1): Met SLP Short Term Goal 2 (Week 1): Pt will sustain her attention to basic, familiar tasks for 5 minute intervals with min cues for redirection.   SLP Short Term Goal 2 - Progress (Week 1): Met SLP Short Term Goal 3 (Week 1): Pt will complete basic, familiar tasks wtih min assist cues for functional problem solving.  SLP Short Term Goal 3 - Progress (Week 1): Progressing toward goal SLP Short Term Goal 4 (Week 1): Pt will recall basic, daily information with min cues for use of external aids.   SLP Short Term Goal 4 - Progress (Week 1): Progressing toward goal    New Short Term Goals: Week 2: SLP Short Term Goal 1 (Week 2): Pt will consume therapeutic trials of regular textures and thin liquids with mod I use of swallowing precautions and minimal overt s/s of aspiration prior to diet advancement.  SLP Short Term Goal 2 (Week 2): Pt will sustain her attention to basic, familiar tasks for 5 minute intervals with supervision cues for redirection.   SLP Short Term Goal 3 (Week 2): Pt will complete basic, familiar tasks wtih min assist cues for functional problem solving.  SLP Short Term Goal 4 (Week 2): Pt will recall basic, daily information with min cues for use of external aids.    Weekly Progress Updates:   Pt has made functional gains this reporting  period and has met 2 out of 4 short term goals.  Pt is currently mod assist for tasks due to moderate cognitive deficits.  Pt has demonstrated improved sustained attention to tasks and toleration of advanced solids.  As a result, pt is consuming dys 3, thin liquids diet with supervision cues for use of swallowing precautions.  Pt and family education is ongoing.  Pt would continue to benefit from skilled ST while inpatient in order to maximize functional independence and reduce burden of care prior to discharge.  Anticipate that pt will need 24/7 supervision at discharge in addition to [facility] follow up at next level of care.   Intensity: Minumum of 1-2 x/day, 30 to 90 minutes Frequency: 3 to 5 out of 7 days Duration/Length of Stay: 15-20 days  Treatment/Interventions: Cognitive remediation/compensation;Cueing hierarchy;Functional tasks;Environmental controls;Internal/external aids;Patient/family education;Dysphagia/aspiration precaution training   Daily Session  Skilled Therapeutic Interventions:  Session 1:  Pt was seen for skilled ST targeting cognitive goals.  SLP facilitated the session with simple medication management tasks to address problem solving goals.  Pt needed mod-max cues to read medication labels and then organize pills into a medication chart due to decreased attention to task, slowed processing speed, and decreased working memory.  Discussed slow progress with pt's husband who had questions regarding potential changes to pt's discharge plan given that he and the pt are currently uninsured and paying for CIR out of pocket.  Encouraged him to reach  out to PT and OT as I suspect that pt will be more challenging for him to manage physically than cognitively as he has demonstrated appropriate cuing strategies when assisting pt with tasks and was providing pt with assistance for medications and finances prior to admission.  Will plan to follow up for dysphagia treatment at next available  appointment to determine readiness to advance diet.    Session 2:  Pt was seen for skilled ST targeting dysphagia goals.  Pt consumed dys 3 textures with thin liquids during her lunch meal with distant supervision cues for use of swallowing precautions and no overt s/s of aspiration with solids or liquids.  Recommend advancing pt's diet to dys 3, thin liquids.  Goals updated on this date to reflect current progress and plan of care.          General    Pain Pain Assessment Pain Scale: 0-10 Pain Score: 0-No pain  Therapy/Group: Individual Therapy  Krisalyn Yankowski, Selinda Orion 12/20/2017, 3:48 PM

## 2017-12-20 NOTE — Progress Notes (Signed)
Occupational Therapy Session Note  Patient Details  Name: Glenda Walker MRN: [id number] Date of Birth: 05-18-1955  Today's Date: 12/20/2017 OT Individual Time: 1300-1349 OT Individual Time Calculation (min): 49 min    Short Term Goals: Week 2:  OT Short Term Goal 1 (Week 2): Pt will maintain sustained attention during selfcare tasks for 15 mins with no more than mod instructional cueing for re-direction. OT Short Term Goal 2 (Week 2): Pt will complete UB bathing and dressing with supervision. OT Short Term Goal 3 (Week 2): Pt will complete LB bathing sit to stand with min assist. OT Short Term Goal 4 (Week 2): Pt will complete toilet transfer ambulating to the elevated toilet with rail and RW with supervision.   Skilled Therapeutic Interventions/Progress Updates:    Pt worked on standing balance and sit to stand in the therapy gym.  Min guard assist for transfer with the RW to the therapy mat.  Pt was able to stand for 16 mins straight with close supervision while engaged in cognitive sorting task.  She needed mod demonstrational cueing for sequencing ping pong balls in carton to match the picture given.  She was able to place the correct ping pong ball 75% of the time but then needed max questioning cueing to orient the colors of them according to the picture given.  Next had pt practice tub/shower transfer with use of the RW and tub bench with spouse present.  He was able to safely assist her with task with min assist.  Pt escorted back to the room via wheelchair with pt staying up in the chair to eat and wait for next PT session.  Checked spouse off to assist with toilet transfers using the RW.    Therapy Documentation Precautions:  Precautions Precautions: Fall Precaution Comments: trach Restrictions Weight Bearing Restrictions: No  Pain: Pain Assessment Pain Scale: Faces Pain Score: 0-No pain ADL: See Care Plan Section of Chart for details  Therapy/Group: Individual  Therapy  Adreena Willits OTR/L 12/20/2017, 3:39 PM

## 2017-12-20 NOTE — Progress Notes (Signed)
Physical Therapy Weekly Progress Note  Patient Details  Name: Glenda Walker MRN: [id number] Date of Birth: [birth year]  Beginning of progress report period: December 13, 2017 End of progress report period: December 20, 2017  Today's Date: 12/20/2017     Patient has met 2 of 2 short term goals.  Pt has made good progress during this current week of therapy. She continues to require increased time for activities due to decreased endurance and generalized weakness however has now improved functional transfers and gait to an overall minA level nearing CGA.   Patient continues to demonstrate the following deficits muscle weakness, decreased cardiorespiratoy endurance and impaired timing and sequencing, decreased coordination and decreased motor planning and therefore will continue to benefit from skilled PT intervention to increase functional independence with mobility.  Patient progressing toward long term goals..  Continue plan of care.  PT Short Term Goals Week 1:  PT Short Term Goal 1 (Week 1): pt will perform functional transfers with mod A PT Short Term Goal 1 - Progress (Week 1): Met PT Short Term Goal 2 (Week 1): pt will perform gait with max A x 10' in controlled environment PT Short Term Goal 2 - Progress (Week 1): Met Week 2:    STG=LTG due to ELOS       Therapy Documentation Precautions:  Precautions Precautions: Fall Precaution Comments: trach Restrictions Weight Bearing Restrictions: No Vital Signs: Therapy Vitals Temp: 97.8 F (36.6 C) Temp Source: Oral Pulse Rate: 82 Resp: 16 BP: 107/64 Patient Position (if appropriate): Lying Oxygen Therapy SpO2: 99 % O2 Device: Room Air  Therapy/Group: Individual Therapy  Rosita DeChalus 12/20/2017, 7:58 AM

## 2017-12-20 NOTE — Progress Notes (Signed)
Occupational Therapy Session Note  Patient Details  Name: Glenda Walker MRN: [id number] Date of Birth: 03/07/1956  Today's Date: 12/20/2017 OT Individual Time: 4098-1191 OT Individual Time Calculation (min): 61 min    Short Term Goals: Week 2:  OT Short Term Goal 1 (Week 2): Pt will maintain sustained attention during selfcare tasks for 15 mins with no more than mod instructional cueing for re-direction. OT Short Term Goal 2 (Week 2): Pt will complete UB bathing and dressing with supervision. OT Short Term Goal 3 (Week 2): Pt will complete LB bathing sit to stand with min assist. OT Short Term Goal 4 (Week 2): Pt will complete toilet transfer ambulating to the elevated toilet with rail and RW with supervision.   Skilled Therapeutic Interventions/Progress Updates:    Pt presents supine in bed sleeping, though able to arouse and agreeable to treatment session. Pt performing supine>sit with supervision. Pt completing stand pivot transfer EOB>BSC>w/c using RW with CGA-minA and min cues. Pt requiring overall modA for toileting, with assist for peri-care after BM and light assist for clothing management. Pt then completing bathing/dressing ADLs from w/c level at sink. Pt overall completing bathing task at minA level with intermittent cues for sequencing through task completion. Pt performing UB dressing with supervision donning overhead shirt. Pt with decreased attention levels during LB dressing requiring increased time/cues to perform, overall completing with assist to fully thread RLE into pantleg, minA in standing while pt advances shorts over hips. Pt stood at sink to brush teeth end of session with overall CGA during task performance. Pt left seated in w/c end of session with seatbelt alarm set, call bell and needs within reach, spouse present.   Therapy Documentation Precautions:  Precautions Precautions: Fall Precaution Comments: trach Restrictions Weight Bearing Restrictions:  No   Pain: Pain Assessment Pain Scale: 0-10 Pain Score: 0-No pain     Therapy/Group: Individual Therapy  Orlando Penner 12/20/2017, 12:53 PM

## 2017-12-20 NOTE — Progress Notes (Signed)
Recreational Therapy Session Note  Patient Details  Name: Glenda Walker MRN: [id number] Date of Birth: 06-20-1955 Today's Date: 12/20/2017  TR eval deferred as pt is not appropriate for TR services at this time and soon approaching discharge date.  Will continue to monitor through team. Kanoe Wanner 12/20/2017, 3:41 PM

## 2017-12-20 NOTE — Progress Notes (Signed)
Physical Therapy Session Note  Patient Details  Name: Glenda Walker MRN: [id number] Date of Birth: 06/12/1955  Today's Date: 12/20/2017 PT Individual Time: 1420-1505 PT Individual Time Calculation (min): 45 min   Short Term Goals: Week 1:  PT Short Term Goal 1 (Week 1): pt will perform functional transfers with mod A PT Short Term Goal 1 - Progress (Week 1): Met PT Short Term Goal 2 (Week 1): pt will perform gait with max A x 10' in controlled environment PT Short Term Goal 2 - Progress (Week 1): Met  Skilled Therapeutic Interventions/Progress Updates: Pt presented in w/c agreeable to therapy. Pt transported to rehab gym for energy conservation. Participated in gait training with RW 77f and 65 ft with seated rest. Pt required min cues for maintaining straight trajectory and keeping close to RW as well as maintaining full knee extension in stance phase. Pt noted to have some decreased foot clearance with fatigue. Pt transported to day room and performed stand pivot transfer to NuStep, participated x 3 min with 4 extremities and x 3 min with BLE only due to increased soreness at chest. Pt required verbal cues for sustained task and attempted to maintain steps per min >30 however pt would intermittently stop due to internal distractions. Pt returned to w/c in same manner as prior and returned to room total A. Pt remained in w/c at end of session and left with husband present and current needs met.      Therapy Documentation Precautions:  Precautions Precautions: Fall Precaution Comments: trach Restrictions Weight Bearing Restrictions: No General:   Vital Signs: Therapy Vitals Temp: 98 F (36.7 C) Pulse Rate: 63 Resp: 17 BP: 109/76 Patient Position (if appropriate): Sitting Oxygen Therapy SpO2: 100 % O2 Device: Room Air Pain: Pain Assessment Pain Scale: 0-10 Pain Score: 0-No pain   Therapy/Group: Individual Therapy  Sylver Vantassell  Tirso Laws, PTA  12/20/2017,  3:44 PM

## 2017-12-21 ENCOUNTER — Inpatient Hospital Stay (HOSPITAL_COMMUNITY): Payer: Medicare Other

## 2017-12-21 ENCOUNTER — Inpatient Hospital Stay (HOSPITAL_COMMUNITY): Payer: Medicare Other | Admitting: Speech Pathology

## 2017-12-21 ENCOUNTER — Inpatient Hospital Stay (HOSPITAL_COMMUNITY): Payer: Medicare Other | Admitting: Physical Therapy

## 2017-12-21 LAB — GLUCOSE, CAPILLARY
GLUCOSE-CAPILLARY: 143 mg/dL — AB (ref 70–99)
GLUCOSE-CAPILLARY: 151 mg/dL — AB (ref 70–99)
Glucose-Capillary: 112 mg/dL — ABNORMAL HIGH (ref 70–99)
Glucose-Capillary: 110 mg/dL — ABNORMAL HIGH (ref 70–99)

## 2017-12-21 NOTE — Progress Notes (Signed)
Speech Language Pathology Daily Session Note  Patient Details  Name: Glenda Walker MRN: [id number] Date of Birth: September 07, 1955  Today's Date: 12/21/2017 SLP Individual Time: 0805-0900 SLP Individual Time Calculation (min): 55 min  Short Term Goals: Week 2: SLP Short Term Goal 1 (Week 2): Pt will consume therapeutic trials of regular textures and thin liquids with mod I use of swallowing precautions and minimal overt s/s of aspiration prior to diet advancement.  SLP Short Term Goal 2 (Week 2): Pt will sustain her attention to basic, familiar tasks for 5 minute intervals with supervision cues for redirection.   SLP Short Term Goal 3 (Week 2): Pt will complete basic, familiar tasks wtih min assist cues for functional problem solving.  SLP Short Term Goal 4 (Week 2): Pt will recall basic, daily information with min cues for use of external aids.    Skilled Therapeutic Interventions:  Pt was seen for skilled ST targeting cognitive goals.  Pt was upright in wheelchair already upon therapist's arrival.  She had also already eaten breakfast and husband reported pt tolerated advanced solids well.  SLP facilitated the session with a novel card game targeting goals for recall, problem solving, and attention.  Pt was limited by lethargy throughout activity and needed max cues to attend to task for ~30 seconds to 1 minute intervals.  She would quickly doze off but was easily awakened and when asked how she was feeling she stated "fine."  Pt was not wearing TED hose so BP was checked to determine if hypotension was impacting alertness.  BP 104/68 which changed to 107/68 after donning hose.  No appreciable changes in mentation noted with or without compression stockings.  Pt's husband reports that pt was up frequently throughout the night going to the bathroom.  Pt was returned to room and left in wheelchair with husband at bedside.  Continue per current plan of care.       Pain Pain Assessment Pain Scale:  0-10 Pain Score: 0-No pain  Therapy/Group: Individual Therapy  Burton Gahan, Melanee Spry 12/21/2017, 8:57 AM

## 2017-12-21 NOTE — Progress Notes (Signed)
Occupational Therapy Session Note  Patient Details  Name: Glenda Walker MRN: [id number] Date of Birth: [birth year]  Today's Date: 12/21/2017 OT Individual Time: 1400-1500 OT Individual Time Calculation (min): 60 min    Short Term Goals: Week 2:  OT Short Term Goal 1 (Week 2): Pt will maintain sustained attention during selfcare tasks for 15 mins with no more than mod instructional cueing for re-direction. OT Short Term Goal 2 (Week 2): Pt will complete UB bathing and dressing with supervision. OT Short Term Goal 3 (Week 2): Pt will complete LB bathing sit to stand with min assist. OT Short Term Goal 4 (Week 2): Pt will complete toilet transfer ambulating to the elevated toilet with rail and RW with supervision.   Skilled Therapeutic Interventions/Progress Updates:    Session focused on overhead grooming task at sink level for B UE strengthening/endurance. Pt completed stand pivot transfer from w/c to regular chair with CGA. Pt required min A to shampoo hair while seated. Pt able to brush hair herself but d/t level of matted/knotted hair, required mod A to complete hair brushing. Pt took several rest breaks d/t arms getting tired with all VSS. Pt left sitting up with husband present and all needs met.   Therapy Documentation Precautions:  Precautions Precautions: Fall Precaution Comments: trach Restrictions Weight Bearing Restrictions: No    Vital Signs: Therapy Vitals Temp: 97.7 F (36.5 C) Temp Source: Oral Pulse Rate: 67 Resp: 18 BP: 115/70 Patient Position (if appropriate): Sitting Oxygen Therapy SpO2: (!) 87 % O2 Device: Room Air Pain: Pain Assessment Pain Scale: 0-10 Pain Score: 0-No pain   Therapy/Group: Individual Therapy  Curtis Sites 12/21/2017, 3:11 PM

## 2017-12-21 NOTE — Progress Notes (Signed)
[place] PHYSICAL MEDICINE & REHABILITATION PROGRESS NOTE  Subjective/Complaints:  Patient seen laying in bed this morning.  She states she slept well overnight.  She still sleepy this morning.  Husband at bedside.  ROS:  Denies CP, SOB, nausea, vomiting, diarrhea  Objective: Vital Signs: Blood pressure 107/88, pulse 82, temperature 98 F (36.7 C), temperature source Oral, resp. rate 20, weight 69.6 kg, SpO2 100 %. No results found. Recent Labs    12/19/17 0403  WBC 6.9  HGB 7.8*  HCT 25.7*  PLT 288   Recent Labs    12/19/17 0403  NA 129*  K 5.0  CL 98  CO2 24  GLUCOSE 124*  BUN 24*  CREATININE 1.25*  CALCIUM 10.1    Physical Exam: Today's Vitals   12/20/17 1543 12/20/17 2045 12/21/17 0025 12/21/17 0411  BP:  104/67  107/88  Pulse:  83  82  Resp:  18  20  Temp:  98 F (36.7 C)  98 F (36.7 C)  TempSrc:  Oral  Oral  SpO2:  100%  100%  Weight:    69.6 kg  PainSc: 0-No pain 2  Asleep    Body mass index is 24.77 kg/m.  Constitutional: She appears well-developed. Frail HENT: Normocephalic and atraumatic.  Eyes: EOM are normal.  No discharge.  Neck: Stoma dressed Cardiovascular:  Irregularly irregular.  No JVD. Respiratory: Effort normal.  Clear. Right lower lateral chest with Pleurex catheter in place with dry dressing.  GI: Mildly distended. Bowel sounds are normal, stable.  Musculoskeletal: No edema or tenderness in extremities Neurological: She is alert and oriented  Delayed and slow speech,  improving.  She was able to follow basic motor commands.  Motor: Grossly 4/5 throughout,  unchanged Skin: Skin is warm and dry.  Psychiatric: Her affect is blunt. Her speech is delayed. She is slowedand withdrawn, improving.   Assessment/Plan: 1. Functional deficits secondary to debility which require 3+ hours per day of interdisciplinary therapy in a comprehensive inpatient rehab setting.  Physiatrist is providing close team supervision and 24 hour  management of active medical problems listed below.  Physiatrist and rehab team continue to assess barriers to discharge/monitor patient progress toward functional and medical goals  Care Tool:  Bathing    Body parts bathed by patient: Right arm, Left arm, Chest, Abdomen, Front perineal area, Buttocks, Right upper leg, Left upper leg, Right lower leg, Left lower leg, Face   Body parts bathed by helper: Buttocks Body parts n/a: Chest, Abdomen, Front perineal area, Buttocks   Bathing assist Assist Level: Minimal Assistance - Patient > 75%     Upper Body Dressing/Undressing Upper body dressing   What is the patient wearing?: Pull over shirt    Upper body assist Assist Level: Supervision/Verbal cueing    Lower Body Dressing/Undressing Lower body dressing      What is the patient wearing?: Pants     Lower body assist Assist for lower body dressing: Minimal Assistance - Patient > 75%     Toileting Toileting Toileting Activity did not occur (Clothing management and hygiene only): N/A (no void or bm)  Toileting assist Assist for toileting: Moderate Assistance - Patient 50 - 74% Assistive Device Comment: BSC and RW    Transfers Chair/bed transfer  Transfers assist     Chair/bed transfer assist level: Contact Guard/Touching assist     Locomotion Ambulation   Ambulation assist   Ambulation activity did not occur: Safety/medical concerns  Assist level: Minimal Assistance - Patient >  75% Assistive device: Walker-rolling Max distance: 59ft   Walk 10 feet activity   Assist     Assist level: Minimal Assistance - Patient > 75% Assistive device: Walker-rolling   Walk 50 feet activity   Assist Walk 50 feet with 2 turns activity did not occur: Safety/medical concerns  Assist level: Minimal Assistance - Patient > 75% Assistive device: Walker-rolling    Walk 150 feet activity   Assist Walk 150 feet activity did not occur: Safety/medical concerns          Walk 10 feet on uneven surface  activity   Assist Walk 10 feet on uneven surfaces activity did not occur: Safety/medical concerns         Wheelchair     Assist   Type of Wheelchair: Manual    Wheelchair assist level: Moderate Assistance - Patient 50 - 74% Max wheelchair distance: 75    Wheelchair 50 feet with 2 turns activity    Assist    Wheelchair 50 feet with 2 turns activity did not occur: Safety/medical concerns   Assist Level: Moderate Assistance - Patient 50 - 74%   Wheelchair 150 feet activity     Assist Wheelchair 150 feet activity did not occur: Safety/medical concerns          Medical Problem List and Plan: 1. Limitation in self-care, endurance, dysphagia, and mobility secondary to debility.             Continue CIR 2. DVT Prophylaxis/Anticoagulation: Mechanical: Sequential compression devices, below knee Bilateral lower extremities.  Lovenox started on 10/1.             Dopplers negative for DVT 3. Pain Management: Tylenol prn 4. Mood: LCSW to follow for evaluation and support.  5. Neuropsych: This patient is not fully capable of making decisions on her own behalf. 6. Skin/Wound Care: Routine pressure relief measures. Maintain adequate nutritional hydration status. Added supplements 3 times daily. 7. Fluids/Electrolytes/Nutrition: Monitor I's and O's.              Advance to D3 thins on 10/8, continue to advance as tolerated 8. Acute on chronic respiratory failure: Question history of OSA.              Decannulated on 10/7 without issue 9. Right pleural effusion: Right Pleurx drain placed 09/17 by radiology. Drain Pleurx daily-monitor for any signs of bleeding.  10. Chronic systolic CHF: Monitor weights daily for trends and watch for other signs of overload. On spironolactone, lasix and low dose ASA. No statin due to cirrhosis.  Filed Weights   12/19/17 0404 12/20/17 0440 12/21/17 0411  Weight: 68.3 kg 68.9 kg 69.6 kg                Stable on 10/9 11. Chronic A fib: Monitor heart rate twice daily--resumed low dose BB due to tachycardia.   Was on Xarelto at home due to embolic stroke, will consider restarting after labs tomorrow             Monitor heart rate with increased mobility 12. CAD s/p CABG/ICM: Will need ICD in the future.  13. H/o R-MCA stroke: Has been off Xarelto since June per husband/records. Continue low dose ASA.              Will consider restarting if her labs tomorrow 14. H/o depression: Stable on paxil. Also on modafinil for activation.  15. GIB with hematochezia due to esophagitis: On PPI bid.  Hemoglobin 7.8 on 10/7  Hemoccult negative             Cont to monitor 16. Cirrhosis of the liver/Hepatic encephalopathy: Continue Rifaximin (off lactulose)             Ammonia remains elevated at 36 on 10/1 17. T2DM: Hgb A1C- 5.5. Her intake variable therefore will monitor BS ac/hs and use SSI for now.              Controlled on 10/9 18. Constipation: No BM since 9/24 per nursing. Increased miralax to bid. May need to resume lactulose. 19. Code status: Husband wants full code now that patient is getting better.  20. Hyponatremia  Sodium 129 on 10/7  Labs ordered for tomorrow  Continue to monitor 21. AKI             Creatinine 1.25 on 10/7  Labs ordered for tomorrow             Encourage fluids   LOS: 9 days A FACE TO FACE EVALUATION WAS PERFORMED  Ankit Karis Juba 12/21/2017, 8:02 AM

## 2017-12-21 NOTE — Progress Notes (Signed)
Physical Therapy Session Note  Patient Details  Name: MARCHETA HORSEY MRN: [id number] Date of Birth: 06-02-1955  Today's Date: 12/21/2017 PT Individual Time: 1100-1200 PT Individual Time Calculation (min): 60 min   Short Term Goals: Week 2:     Skilled Therapeutic Interventions/Progress Updates: Pt presented in w/c agreeable to therapy. Pt transported to rehab gym total A for energy conservation. Session focused on general BLE strengthening and ambulation. Participated in step ups in forward and lateral x 10 bilaterally with emphasis on full knee extension and sustained task. Pt then participated in side stepping with level 2 resistance band 6 ft x 4 for hip strengthening. Participated in standing TKE with level 2 resistance band x 10 bilaterally. Pt required intermittent and extended seated rests due to fatigue. Participated in peg board minimal challenge in standing without AD, pt noted to have no LOB at close S level. After therapeutic rest pt ambulated back to room CGA with min cues for increasing BOS and safety with RW. Pt returned to w/c at end of session and left with husband present and needs met.      Therapy Documentation Precautions:  Precautions Precautions: Fall Precaution Comments: trach Restrictions Weight Bearing Restrictions: No General:   Vital Signs: Therapy Vitals Temp: 97.7 F (36.5 C) Temp Source: Oral Pulse Rate: 67 Resp: 18 BP: 115/70 Patient Position (if appropriate): Sitting Oxygen Therapy SpO2: (!) 87 % O2 Device: Room Air    Therapy/Group: Individual Therapy  Paola Aleshire  Kobi Mario, PTA  12/21/2017, 3:47 PM

## 2017-12-21 NOTE — Progress Notes (Signed)
Physical Therapy Session Note  Patient Details  Name: Glenda Walker MRN: [id number] Date of Birth: 03-Nov-1955  Today's Date: 12/21/2017 PT Individual Time: 0950-1020 PT Individual Time Calculation (min): 30 min      Skilled Therapeutic Interventions/Progress Updates:   Pt dressed and up in w/c.  W/c propulsion using bil UEs, with min assist for turns and efficiency.  Stand pivot transfer w/c> mat.  Seated stretch bil heel cords, x 30 seconds, pressing heels down into floor with forefeet on wedge.  Sustained stretch and balance challenge standing with forefeet on wedge, with bil UE support on RW, x 3 mintues.  Pt exhibited R stepping strategy only for frequent intermittent LOB backwards requiring mod assist to regain. Manual cues to facilitate hip flexion, with difficulty.   Pt's R foot remains in supinated position in standing.  Grade I-II joint mobilizations to increase joint mobility.   Gait training with RW on level tile x 75' with min guard assist.  Pt left resting in w/c with needs at hand and husband present.    Therapy Documentation Precautions:  Precautions Precautions: Fall Precaution Comments: trach Restrictions Weight Bearing Restrictions: No  Pain: Pain Assessment Pain Scale: 0-10 Pain Score: 0-No pain     Therapy/Group: Individual Therapy  Rakeen Gaillard 12/21/2017, 4:32 PM

## 2017-12-22 ENCOUNTER — Inpatient Hospital Stay (HOSPITAL_COMMUNITY): Payer: Medicare Other | Admitting: Speech Pathology

## 2017-12-22 ENCOUNTER — Other Ambulatory Visit: Payer: Self-pay

## 2017-12-22 ENCOUNTER — Inpatient Hospital Stay (HOSPITAL_COMMUNITY): Payer: Medicare Other | Admitting: Occupational Therapy

## 2017-12-22 ENCOUNTER — Encounter (HOSPITAL_COMMUNITY): Payer: Self-pay

## 2017-12-22 ENCOUNTER — Inpatient Hospital Stay (HOSPITAL_COMMUNITY): Payer: Medicare Other | Admitting: Physical Therapy

## 2017-12-22 ENCOUNTER — Inpatient Hospital Stay (HOSPITAL_COMMUNITY): Payer: Medicare Other

## 2017-12-22 DIAGNOSIS — R400 Somnolence: Secondary | ICD-10-CM

## 2017-12-22 LAB — BASIC METABOLIC PANEL
ANION GAP: 10 (ref 5–15)
BUN: 29 mg/dL — ABNORMAL HIGH (ref 8–23)
CALCIUM: 10.6 mg/dL — AB (ref 8.9–10.3)
CO2: 24 mmol/L (ref 22–32)
Chloride: 95 mmol/L — ABNORMAL LOW (ref 98–111)
Creatinine, Ser: 1.2 mg/dL — ABNORMAL HIGH (ref 0.44–1.00)
GFR calc Af Amer: 55 mL/min — ABNORMAL LOW (ref >60)
GFR calc non Af Amer: 48 mL/min — ABNORMAL LOW (ref >60)
GLUCOSE: 163 mg/dL — AB (ref 70–99)
POTASSIUM: 4.7 mmol/L (ref 3.5–5.1)
Sodium: 129 mmol/L — ABNORMAL LOW (ref 135–145)

## 2017-12-22 LAB — CBC WITH DIFFERENTIAL/PLATELET
Abs Immature Granulocytes: 0.03 10*3/uL (ref 0.00–0.07)
Basophils Absolute: 0 10*3/uL (ref 0.0–0.1)
Basophils Relative: 0 %
EOS ABS: 0.3 10*3/uL (ref 0.0–0.5)
Eosinophils Relative: 4 %
HEMATOCRIT: 26.3 % — AB (ref 36.0–46.0)
Hemoglobin: 7.8 g/dL — ABNORMAL LOW (ref 12.0–15.0)
IMMATURE GRANULOCYTES: 0 %
Lymphocytes Relative: 12 %
Lymphs Abs: 0.9 10*3/uL (ref 0.7–4.0)
MCH: 27.5 pg (ref 26.0–34.0)
MCHC: 29.7 g/dL — ABNORMAL LOW (ref 30.0–36.0)
MCV: 92.6 fL (ref 80.0–100.0)
MONO ABS: 0.4 10*3/uL (ref 0.1–1.0)
MONOS PCT: 6 %
NEUTROS PCT: 78 %
Neutro Abs: 5.4 10*3/uL (ref 1.7–7.7)
Platelets: 345 10*3/uL (ref 150–400)
RBC: 2.84 MIL/uL — ABNORMAL LOW (ref 3.87–5.11)
RDW: 18.6 % — AB (ref 11.5–15.5)
WBC: 7.1 10*3/uL (ref 4.0–10.5)
nRBC: 0 % (ref 0.0–0.2)

## 2017-12-22 LAB — BLOOD GAS, ARTERIAL
Acid-base deficit: 2.4 mmol/L — ABNORMAL HIGH (ref 0.0–2.0)
Bicarbonate: 21.6 mmol/L (ref 20.0–28.0)
Drawn by: 246101
FIO2: 21
O2 Saturation: 98.1 %
PATIENT TEMPERATURE: 98.6
PCO2 ART: 35.2 mmHg (ref 32.0–48.0)
PO2 ART: 102 mmHg (ref 83.0–108.0)
pH, Arterial: 7.405 (ref 7.350–7.450)

## 2017-12-22 LAB — AMMONIA: AMMONIA: 21 umol/L (ref 9–35)

## 2017-12-22 LAB — GLUCOSE, CAPILLARY
GLUCOSE-CAPILLARY: 134 mg/dL — AB (ref 70–99)
GLUCOSE-CAPILLARY: 141 mg/dL — AB (ref 70–99)
GLUCOSE-CAPILLARY: 145 mg/dL — AB (ref 70–99)
Glucose-Capillary: 100 mg/dL — ABNORMAL HIGH (ref 70–99)

## 2017-12-22 NOTE — Progress Notes (Signed)
Occupational Therapy Session Note  Patient Details  Name: Glenda Walker MRN: [id number] Date of Birth: [birth year]  Today's Date: 12/22/2017 OT Individual Time: 8756-4332 OT Individual Time Calculation (min): 45 min    Short Term Goals: Week 1:  OT Short Term Goal 1 (Week 1): Pt will complete Ub bathing and dressing with supervision. OT Short Term Goal 1 - Progress (Week 1): Not met OT Short Term Goal 2 (Week 1): Pt will complete LB bathing sit to stand with min assist. OT Short Term Goal 2 - Progress (Week 1): Not met OT Short Term Goal 3 (Week 1): Pt will complete LB dressing with mod assist sit to stand for 2 consecutive sessions. OT Short Term Goal 3 - Progress (Week 1): Met OT Short Term Goal 4 (Week 1): Pt will complete toilet transfers with mod assist stand pivot RW to 3:1 OT Short Term Goal 4 - Progress (Week 1): Met OT Short Term Goal 5 (Week 1): Pt will maintain sustained attention during selfcare tasks for 15 mins with no more than mod instructional cueing for re-direction. OT Short Term Goal 5 - Progress (Week 1): Not met Week 2:  OT Short Term Goal 1 (Week 2): Pt will maintain sustained attention during selfcare tasks for 15 mins with no more than mod instructional cueing for re-direction. OT Short Term Goal 2 (Week 2): Pt will complete UB bathing and dressing with supervision. OT Short Term Goal 3 (Week 2): Pt will complete LB bathing sit to stand with min assist. OT Short Term Goal 4 (Week 2): Pt will complete toilet transfer ambulating to the elevated toilet with rail and RW with supervision.       Skilled Therapeutic Interventions/Progress Updates:    Pt received in w/c completing nursing care.  Pt in a bright mood and engaging well with this therapist.  Discussed her upcoming discharge date and therapy goals.  Pt agreeable to bathing and dressing.  Pt demonstrated improved body awareness and initiation but did need occasional touching or guiding A to complete bathing  fully or donning clothing.  Pt completed sit to stands at the sink with S to occasional touching A and stood with close S.  She completed grooming with set up.  Pt in chair to complete brushing her hair at the sink with lap belt alarm on and all needs met.  Therapy Documentation Precautions:  Precautions Precautions: Fall Precaution Comments: trach Restrictions Weight Bearing Restrictions: No Pain:  no c/o pain ADL: ADL Eating: Set up Where Assessed-Eating: Bed level Grooming: Minimal assistance Where Assessed-Grooming: Edge of bed Upper Body Bathing: Minimal assistance Where Assessed-Upper Body Bathing: Edge of bed Lower Body Bathing: Maximal assistance Where Assessed-Lower Body Bathing: Edge of bed Upper Body Dressing: Minimal assistance Where Assessed-Upper Body Dressing: Edge of bed Lower Body Dressing: Maximal assistance Where Assessed-Lower Body Dressing: Edge of bed Toileting: Maximal assistance   Therapy/Group: Individual Therapy  Katrinia Straker 12/22/2017, 8:48 AM

## 2017-12-22 NOTE — Progress Notes (Signed)
Physical Therapy Session Note  Patient Details  Name: Glenda Walker MRN: [id number] Date of Birth: 01/05/1956  Today's Date: 12/22/2017 PT Individual Time: 1100-1200 PT Individual Time Calculation (min): 60 min   Short Term Goals: Week 1:  PT Short Term Goal 1 (Week 1): pt will perform functional transfers with mod A PT Short Term Goal 1 - Progress (Week 1): Met PT Short Term Goal 2 (Week 1): pt will perform gait with max A x 10' in controlled environment PT Short Term Goal 2 - Progress (Week 1): Met Week 2:    STG=LTG due to ELOS  Skilled Therapeutic Interventions/Progress Updates:   Pt received supine in bed and agreeable to PT. Supine>sit transfer with supervision assist, and increased time,  Min cues also provided by PT for proper use of bed features.   Stand pivot transfer to [facility] with min assist from PT and UE support on RW from elevated bed height. Pt transported to day room in [facility].   dynamic standing balance and attention task using Wii Fit balance board. Penguin slide x 3, table tilt x 3. Moderate cues for improved use of hip strategy to shift weight as well as improved engagement in activity and decreased attention to distractions.  Following balance training, pt reports need for urination.   Toileting with supervision assist from PT to transfer onto toilet and perform peri care. Moderate assist to return to [facility] to stand from toilet without rails. Pt performed hand hygiene sitting in WC.   Gait training with RW x 135f and supervision assist fading to min assist with fatigue. Moderate cues for AD management in turns as well as pursed lip breathing to maintain full O2 saturation and reduce fatigue.   Throughout treatment, pt performed sit<>stand with min assist from PT and min cues for proper technique to prevent posterior LOB.   Patient returned to room and [facility]with call bell in reach and all needs met.           Therapy Documentation Precautions:   Precautions Precautions: Fall Precaution Comments: trach Restrictions Weight Bearing Restrictions: No Vital Signs: Therapy Vitals Pulse Rate: (!) 58 Resp: 19 BP: 125/81 Patient Position (if appropriate): Sitting Oxygen Therapy SpO2: (!) 82 % O2 Device: Room Air Pain: denies   Therapy/Group: Individual Therapy  ALorie Phenix10/12/2017, 5:47 PM

## 2017-12-22 NOTE — Progress Notes (Signed)
Spoke w/ PA re: ABG.  Per PA, hold off obtaining ABG until she can clarify w/ ordering MD.

## 2017-12-22 NOTE — Progress Notes (Signed)
Came to room for ABG, pt is out of room.  RN aware.

## 2017-12-22 NOTE — Patient Care Conference (Signed)
Inpatient RehabilitationTeam Conference and Plan of Care Update Date: 12/21/2017   Time: 2:30 PM    Patient Name: Glenda Walker      Medical Record Number: [id number]  Date of Birth: 1955-08-06 Sex: Female         Room/Bed: 4M03C/4M03C-01 Payor Info: Payor: MEDICARE / Plan: MEDICARE PART A AND B / Product Type: *No Product type* /    Admitting Diagnosis: Debility  Admit Date/Time:  12/12/2017  5:52 PM Admission Comments: No comment available   Primary Diagnosis:  <principal problem not specified> Principal Problem: <principal problem not specified>  Patient Active Problem List   Diagnosis Date Noted  . Somnolence   . Labile blood glucose   . Acute blood loss anemia   . Hyponatremia   . AKI (acute kidney injury) (HCC)   . Debility 12/12/2017  . Dysphagia   . Acute respiratory failure with hypoxia (HCC)   . Chronic systolic heart failure (HCC)   . Coronary artery disease involving coronary bypass graft of native heart without angina pectoris   . History of CVA with residual deficit   . Episode of recurrent major depressive disorder (HCC)   . Gastrointestinal hemorrhage   . Cirrhosis of liver without ascites (HCC)   . Diabetes mellitus type 2 in nonobese (HCC)   . Slow transit constipation   . Major depressive disorder, recurrent episode, mild (HCC) 11/25/2017  . Chronic respiratory failure with hypoxia (HCC) 11/01/2017  . Acute on chronic respiratory failure with hypoxia (HCC) 11/01/2017  . Acute on chronic systolic CHF (congestive heart failure) (HCC) 11/01/2017  . Acute metabolic encephalopathy 11/01/2017  . 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 Chronic atrial fibrillation   . Pleural effusion, right     Expected Discharge Date: Expected Discharge Date: 12/27/17  Team Members Present: Physician leading conference: Dr. Maryla Morrow Social Worker Present: Amada Jupiter, LCSW Nurse Present: Allayne Stack, RN PT Present: Grier Rocher, PT;Rosita Dechalus, PTA OT Present:  Roney Mans, OT SLP Present: Jackalyn Lombard, SLP PPS Coordinator present : Tora Duck, RN, CRRN     Current Status/Progress Goal Weekly Team Focus  Medical   Limitation in self-care, endurance, dysphagia, and mobility secondary to debility.  Improve mobility, DM, COPD, AKI, lethargy, hyponatremia  See above   Bowel/Bladder   incontinent at times; lbm 10/8  cont with mod assist  toilet 2-3 hr while awake; assess q shift and PRN   Swallow/Nutrition/ Hydration   Advanced to dys 3, thin liquids; intermittent supervision   mod I   trials of regular textures.    ADL's   CGA UB bathe/dress; minA overall LB self-care; modA toileting; minA stand pivot transfers; decreased sustained attention and activity tolerance   supervision overall  self-care retraining; balance; increasing activity tolerance, energy conservation, therapeutic activity, therapeutic exercise, pt/family education    Mobility   minA to CGA stand pivot transfers, gait up to 41ft minA with RW,   min A gait, supervision transfers  endurance, general strengthening, transfers, bed mobility, gait.    Communication             Safety/Cognition/ Behavioral Observations  fluctuates depending on alertness,  can be as little as min assist or as much as max assist   supervision   attention, problem solving, memory   Pain   c/o pain to R chest at tube site; relieved with PRN Tylenol  <3  assess q shift and PRN; medicate PRN   Skin   R chest tube site; skin  tear R forearm; mild redness to buttocks; trach site (trach removed)  free from infection/breakdown  assess q shift and PRN; dsg changes per order    Rehab Goals Patient on target to meet rehab goals: Yes *See Care Plan and progress notes for long and short-term goals.     Barriers to Discharge  Current Status/Progress Possible Resolutions Date Resolved   Physician    Medical stability;Trach     See above  Therapies, follow labs, encourage fluids, optimize DM meds, now  decanulated       Nursing                  PT                    OT                  SLP                SW                Discharge Planning/Teaching Needs:  Pt to return home with spouse who can provide 24/7 assistance.   Husband here daily (staying at the hospital)   Team Discussion:  Janina Mayo out and tolerating well.  Monitor labs.  More fatigued today per txs.  D3 diet.  Min-mod ADLs with decreased sustained attention.  Min - CGA with tfs;  amb 100'.  Husband likely to put in a ramp.  superivison tx goals and min assist amb goals.  Husband hopeful she might be able to d/c earlier than initial date.  Team feels this is appropriate.  Revisions to Treatment Plan:  NA    Continued Need for Acute Rehabilitation Level of Care: The patient requires daily medical management by a physician with specialized training in physical medicine and rehabilitation for the following conditions: Daily direction of a multidisciplinary physical rehabilitation program to ensure safe treatment while eliciting the highest outcome that is of practical value to the patient.: Yes Daily medical management of patient stability for increased activity during participation in an intensive rehabilitation regime.: Yes Daily analysis of laboratory values and/or radiology reports with any subsequent need for medication adjustment of medical intervention for : Cardiac problems;Pulmonary problems;Diabetes problems;Renal problems;Other   I attest that I was present, lead the team conference, and concur with the assessment and plan of the team.   Shamyah Stantz 12/22/2017, 2:25 PM

## 2017-12-22 NOTE — Progress Notes (Signed)
This is co-sign for 2 notes of student, Cornelius Moras  9:58 am and 10:27 am today.

## 2017-12-22 NOTE — Progress Notes (Signed)
Occupational Therapy Session Note  Patient Details  Name: Glenda Walker MRN: [id number] Date of Birth: 1955-10-20  Today's Date: 12/22/2017 OT Individual Time: 4098-1191 OT Individual Time Calculation (min): 43 min    Short Term Goals: Week 2:  OT Short Term Goal 1 (Week 2): Pt will maintain sustained attention during selfcare tasks for 15 mins with no more than mod instructional cueing for re-direction. OT Short Term Goal 2 (Week 2): Pt will complete UB bathing and dressing with supervision. OT Short Term Goal 3 (Week 2): Pt will complete LB bathing sit to stand with min assist. OT Short Term Goal 4 (Week 2): Pt will complete toilet transfer ambulating to the elevated toilet with rail and RW with supervision.   Skilled Therapeutic Interventions/Progress Updates:    Pt completed transfer from wheelchair to the bed with min assist in the ADL apartment.  Mod instructional cueing for straightening up in the bed.  Next had pt transfer back to the wheelchair and took her into the ADL kitchen.  Had pt discuss items needed to scramble and egg.  She was able to tell me how she would go about scrambling and egg at home with sequencing verbally about putting milk in with the egg and mixing it in a bowl.  She then reported having a heated pan and frying it slowly.  After discussion of how to complete it, therapist then got out an egg, butter, and pan to attempt frying an egg.  Pt was told to get up and wash her hands and then give it a try.  After standing to wash her hands, she then took both the eggs and the butter and placed them back into the refrigerator instead of attempting to make an egg.  In doing so,  pt needed min assist to keep from losing her balance as she attempted to hold the items with one hand while stepping forward with the walker. When therapist stated he was wanting her to make and egg, she did not acknowledge it and continued attempting to hold the pan with one hand while attempting to  walk with the walker, again demonstrating significant LOB to the right and scissoring with her LEs.  Mod assist to regain balance and then min to direct pt to the wheelchair.  Attempted to show pt the correct way to move items in the kitchen with use of the counter, however, pt not maintaining sustained attention to therapist when he was attempting to show her.  Finished session with functional mobility with use of the RW and min assist in the hallway, since time had now become too limited to try and make the egg.  Will attempt again at a later time.  Pt left in wheelchair at end of session with safety alarm belt in place and call button and phone in reach as well.     Therapy Documentation Precautions:  Precautions Precautions: Fall Precaution Comments: trach Restrictions Weight Bearing Restrictions: No  Pain: Pain Assessment Pain Scale: 0-10 Pain Score: 8  Pain Type: Acute pain Pain Location: Chest Pain Orientation: Right;Mid;Lower Pain Frequency: Occasional Pain Onset: With Activity(pleur vac) Pain Intervention(s): Medication (See eMAR)  Therapy/Group: Individual Therapy  Lavra Imler OTR/L 12/22/2017, 3:54 PM

## 2017-12-22 NOTE — Progress Notes (Cosign Needed Addendum)
Patient transported via bed to xray at 0950. Side rails up x4. Followup when return.

## 2017-12-22 NOTE — Progress Notes (Signed)
Speech Language Pathology Daily Session Note  Patient Details  Name: Glenda Walker MRN: [id number] Date of Birth: Sep 26, 1955  Today's Date: 12/22/2017 SLP Individual Time: 1345-1415 SLP Individual Time Calculation (min): 30 min (Make up time for earlier missed minutes)  Short Term Goals: Week 2: SLP Short Term Goal 1 (Week 2): Pt will consume therapeutic trials of regular textures and thin liquids with mod I use of swallowing precautions and minimal overt s/s of aspiration prior to diet advancement.  SLP Short Term Goal 2 (Week 2): Pt will sustain her attention to basic, familiar tasks for 5 minute intervals with supervision cues for redirection.   SLP Short Term Goal 3 (Week 2): Pt will complete basic, familiar tasks wtih min assist cues for functional problem solving.  SLP Short Term Goal 4 (Week 2): Pt will recall basic, daily information with min cues for use of external aids.    Skilled Therapeutic Interventions:  Pt was seen for skilled ST targeting goals for cognition and dysphagia.  Pt was sleeping in her wheelchair upon therapist's arrival but awakened to therapist's voice and simple environmental modifications to maximize arousal.  SLP facilitated the session with a functional snack of regular textures to continue working towards diet advancement.  Pt consumed advanced solids with no overt s/s of aspiration and no difficulty masticating and clearing boluses from the oral cavity.  Pt could recall at least 2-3 specific details of previous therapy sessions with min assist question cues and more than a reasonable amount of time.  Pt remained lethargic throughout therapy session but became much more animated when engaged in personally meaningful conversations regarding her family and cooking.  Pt was able to recall specific components of a recipe with min question cues.  Pt was left in wheelchair with chair alarm set and call bell within reach.  Continue per current plan of care.       Pain Pain Assessment Pain Scale: 0-10 Pain Score: 0-No pain  Therapy/Group: Individual Therapy  Taisa Deloria, Melanee Spry 12/22/2017, 2:26 PM

## 2017-12-22 NOTE — Progress Notes (Signed)
[facility] PHYSICAL MEDICINE & REHABILITATION PROGRESS NOTE  Subjective/Complaints:  Patient seen laying in bed this morning.  She slept fairly poor sleep chart.  Husband not at bedside this morning.  ROS:  Denies CP, SOB, nausea, vomiting, diarrhea  Objective: Vital Signs: Blood pressure 131/82, pulse 86, temperature 98.5 F (36.9 C), resp. rate 19, weight 69.5 kg, SpO2 100 %. No results found. No results for input(s): WBC, HGB, HCT, PLT in the last 72 hours. No results for input(s): NA, K, CL, CO2, GLUCOSE, BUN, CREATININE, CALCIUM in the last 72 hours.  Physical Exam: Today's Vitals   12/21/17 2015 12/21/17 2116 12/22/17 0407 12/22/17 0500  BP: 106/67  131/82   Pulse: 80  86   Resp: 18  19   Temp: 97.6 F (36.4 C)  98.5 F (36.9 C)   TempSrc: Oral     SpO2: 100%  100%   Weight:    69.5 kg  PainSc:  3      Body mass index is 24.73 kg/m.  Constitutional: She appears well-developed. Frail HENT: Normocephalic and atraumatic.  Eyes: EOM are normal.  No discharge.  Neck: Stoma dressed Cardiovascular:  Irregularly irregular.  No JVD. Respiratory: Effort normal.  Clear. Right lower lateral chest with Pleurex catheter in place with dry dressing.  GI: Mildly distended. Bowel sounds are normal, unchanged.  Musculoskeletal: No edema or tenderness in extremities Neurological: She is alert and oriented, slightly somnolent Delayed and slow speech,  unchanged.  She was able to follow basic motor commands.  Motor: Grossly 4/5 throughout,  unchanged Skin: Skin is warm and dry.  Psychiatric: Her affect is blunt. Her speech is delayed. She is slowedand withdrawn, improving overall.   Assessment/Plan: 1. Functional deficits secondary to debility which require 3+ hours per day of interdisciplinary therapy in a comprehensive inpatient rehab setting.  Physiatrist is providing close team supervision and 24 hour management of active medical problems listed below.  Physiatrist and  rehab team continue to assess barriers to discharge/monitor patient progress toward functional and medical goals  Care Tool:  Bathing    Body parts bathed by patient: Right arm, Left arm, Chest, Abdomen, Front perineal area, Buttocks, Right upper leg, Left upper leg, Right lower leg, Left lower leg, Face   Body parts bathed by helper: Buttocks Body parts n/a: Chest, Abdomen, Front perineal area, Buttocks   Bathing assist Assist Level: Minimal Assistance - Patient > 75%     Upper Body Dressing/Undressing Upper body dressing   What is the patient wearing?: Pull over shirt    Upper body assist Assist Level: Supervision/Verbal cueing    Lower Body Dressing/Undressing Lower body dressing      What is the patient wearing?: Pants     Lower body assist Assist for lower body dressing: Minimal Assistance - Patient > 75%     Toileting Toileting Toileting Activity did not occur (Clothing management and hygiene only): N/A (no void or bm)  Toileting assist Assist for toileting: Moderate Assistance - Patient 50 - 74% Assistive Device Comment: BSC and RW    Transfers Chair/bed transfer  Transfers assist     Chair/bed transfer assist level: Contact Guard/Touching assist     Locomotion Ambulation   Ambulation assist   Ambulation activity did not occur: Safety/medical concerns  Assist level: Minimal Assistance - Patient > 75% Assistive device: Walker-rolling Max distance: 156ft   Walk 10 feet activity   Assist     Assist level: Minimal Assistance - Patient > 75% Assistive  device: Walker-rolling   Walk 50 feet activity   Assist Walk 50 feet with 2 turns activity did not occur: Safety/medical concerns  Assist level: Minimal Assistance - Patient > 75% Assistive device: Walker-rolling    Walk 150 feet activity   Assist Walk 150 feet activity did not occur: Safety/medical concerns         Walk 10 feet on uneven surface  activity   Assist Walk 10 feet on  uneven surfaces activity did not occur: Safety/medical concerns         Wheelchair     Assist   Type of Wheelchair: Manual    Wheelchair assist level: Moderate Assistance - Patient 50 - 74% Max wheelchair distance: 75    Wheelchair 50 feet with 2 turns activity    Assist    Wheelchair 50 feet with 2 turns activity did not occur: Safety/medical concerns   Assist Level: Moderate Assistance - Patient 50 - 74%   Wheelchair 150 feet activity     Assist Wheelchair 150 feet activity did not occur: Safety/medical concerns          Medical Problem List and Plan: 1. Limitation in self-care, endurance, dysphagia, and mobility secondary to debility.             Continue CIR 2. DVT Prophylaxis/Anticoagulation: Mechanical: Sequential compression devices, below knee Bilateral lower extremities.  Lovenox started on 10/1.             Dopplers negative for DVT 3. Pain Management: Tylenol prn 4. Mood: LCSW to follow for evaluation and support.  5. Neuropsych: This patient is not fully capable of making decisions on her own behalf. 6. Skin/Wound Care: Routine pressure relief measures. Maintain adequate nutritional hydration status. Added supplements 3 times daily. 7. Fluids/Electrolytes/Nutrition: Monitor I's and O's.              Advance to D3 thins on 10/8, continue to advance as tolerated 8. Acute on chronic respiratory failure: Question history of OSA.              Decannulated on 10/7 without issue 9. Right pleural effusion: Right Pleurx drain placed 09/17 by radiology. Drain Pleurx daily-monitor for any signs of bleeding.   Chest x-ray ordered on 10/10  ABG ordered on 10/10 10. Chronic systolic CHF: Monitor weights daily for trends and watch for other signs of overload. On spironolactone, lasix and low dose ASA. No statin due to cirrhosis.  Filed Weights   12/20/17 0440 12/21/17 0411 12/22/17 0500  Weight: 68.9 kg 69.6 kg 69.5 kg               Stable on  10/10 11. Chronic A fib: Monitor heart rate twice daily--resumed low dose BB due to tachycardia.   Was on Xarelto at home due to embolic stroke, will consider restarting after labs tomorrow             Monitor heart rate with increased mobility 12. CAD s/p CABG/ICM: Will need ICD in the future.  13. H/o R-MCA stroke: Has been off Xarelto since June per husband/records. Continue low dose ASA.              Will consider restarting pending lab work 14. H/o depression: Stable on paxil. Also on modafinil for activation.  15. GIB with hematochezia due to esophagitis: On PPI bid.            Hemoglobin 7.8 on 10/7  Hemoccult negative  Cont to monitor 16. Cirrhosis of the liver/Hepatic encephalopathy: Continue Rifaximin (off lactulose)             Ammonia remains elevated at 36 on 10/1, repeat ordered on 10/10 17. T2DM: Hgb A1C- 5.5. Her intake variable therefore will monitor BS ac/hs and use SSI for now.              Slightly elevated on 10/10, however relatively controlled otherwise 18. Constipation: No BM since 9/24 per nursing. Increased miralax to bid. May need to resume lactulose. 19. Code status: Husband wants full code now that patient is getting better.  20. Hyponatremia  Sodium 129 on 10/7  Labs pending  Continue to monitor 21. AKI             Creatinine 1.25 on 10/7  Labs pending             Encourage fluids   LOS: 10 days A FACE TO FACE EVALUATION WAS PERFORMED   Karis Juba 12/22/2017, 8:28 AM

## 2017-12-22 NOTE — Progress Notes (Cosign Needed)
Patient back to room 3 at 1020 from xray department via bed x1 assist. Bed rails up x4. Call light in reach

## 2017-12-22 NOTE — Progress Notes (Signed)
SLP Cancellation Note  Patient Details Name: Glenda Walker MRN: [id number] DOB: [birth year]   Cancelled treatment:        Attempted to see pt for scheduled treatment session; however, pt was off unit for procedure.  When checking back on pt after she returned from x ray, she was soundly asleep and nursing was at bedside attempting to wake her to give medications.  As a result, pt missed 60 min of skilled ST due to being off unit for procedure, then for lethargy and nursing care.                                                                                                  Amarrion Pastorino, Melanee Spry 12/22/2017, 12:23 PM

## 2017-12-23 ENCOUNTER — Inpatient Hospital Stay (HOSPITAL_COMMUNITY): Payer: Medicare Other | Admitting: Speech Pathology

## 2017-12-23 ENCOUNTER — Inpatient Hospital Stay (HOSPITAL_COMMUNITY): Payer: Medicare Other | Admitting: Physical Therapy

## 2017-12-23 ENCOUNTER — Inpatient Hospital Stay (HOSPITAL_COMMUNITY): Payer: Medicare Other | Admitting: Occupational Therapy

## 2017-12-23 LAB — GLUCOSE, CAPILLARY
GLUCOSE-CAPILLARY: 112 mg/dL — AB (ref 70–99)
Glucose-Capillary: 114 mg/dL — ABNORMAL HIGH (ref 70–99)
Glucose-Capillary: 118 mg/dL — ABNORMAL HIGH (ref 70–99)
Glucose-Capillary: 157 mg/dL — ABNORMAL HIGH (ref 70–99)

## 2017-12-23 NOTE — Progress Notes (Signed)
Physical Therapy Session Note  Patient Details  Name: Glenda Walker MRN: [id number] Date of Birth: 1955/04/21  Today's Date: 12/23/2017 PT Individual Time: 1003-1100 PT Individual Time Calculation (min): 57 min   Short Term Goals: Week 1:  PT Short Term Goal 1 (Week 1): pt will perform functional transfers with mod A PT Short Term Goal 1 - Progress (Week 1): Met PT Short Term Goal 2 (Week 1): pt will perform gait with max A x 10' in controlled environment PT Short Term Goal 2 - Progress (Week 1): Met Week 2:  PT Short Term Goal 1 (Week 2): STG=LTG due to ELOS  Skilled Therapeutic Interventions/Progress Updates:   Pt received sitting in WC and agreeable to PT. Pt tranported to rehab gym in [facility].   Dynamic gait training with RW to weave through 4 cones x 2.  Stepping over 4 obstacles x 4 (1") with RW. Gait training over unlevel surface 42f x 2 with RW. Min cues for attention to task and AD management around obstacles from PT to improve safety.   Standing balance to perform reaching task to place clothes pins on tree in sequential order. Min cues for recall of order and number of pins per rail. Additional standing balance on ariex pad to toss bean bags to corn hole board X 6 Bil, with 1 UE support. Pt repeated x 5 Bil with no UE support. Min-supervision assist from PT for safety to prevent posterior LOB.   Throughout treatment, pt performed sit<>stand with supervision assist and min cues for proper UE placement to improve use of BLE.   [facility] returned to [facility] and [facility] and all needs met.          Therapy Documentation Precautions:  Precautions Precautions: Fall Precaution Comments: trach Restrictions Weight Bearing Restrictions: No Pain: Pain Assessment Pain Scale: 0-10 Pain Score: 0-No pain    Therapy/Group: Individual Therapy  ALorie Phenix10/01/2018, 11:01 AM

## 2017-12-23 NOTE — Progress Notes (Signed)
Speech Language Pathology Daily Session Note  Patient Details  Name: Glenda Walker MRN: [id number] Date of Birth: 1955-12-18  Today's Date: 12/23/2017 SLP Individual Time: 4098-1191; 1510- 1535 SLP Individual Time Calculation (min): 40 min; 25 min   Short Term Goals: Week 2: SLP Short Term Goal 1 (Week 2): Pt will consume therapeutic trials of regular textures and thin liquids with mod I use of swallowing precautions and minimal overt s/s of aspiration prior to diet advancement.  SLP Short Term Goal 2 (Week 2): Pt will sustain her attention to basic, familiar tasks for 5 minute intervals with supervision cues for redirection.   SLP Short Term Goal 3 (Week 2): Pt will complete basic, familiar tasks wtih min assist cues for functional problem solving.  SLP Short Term Goal 4 (Week 2): Pt will recall basic, daily information with min cues for use of external aids.    Skilled Therapeutic Interventions:  Session 1:  Pt was seen for skilled ST targeting goals for cognition.  Pt was in bed finishing breakfast upon therapist's arrival, both pt and husband continue to endorse good toleration of advanced diet.  Pt requested to go to the bathroom and ambulated to the commode with supervision cues for safety awareness and navigation around obstacles.  Pt was continent of bladder.  Once pt was off the commode and seated in the wheelchair, therapist facilitated the session with a previously taught card game to measure progress from initial attempt.  Pt demonstrated markedly improved attention to task, task initiation, and processing speed as evidenced by only needing supervision cues to complete task.  However, pt did begin getting more fatigued as session progressed which resulted in her needing up to mod cues by the end of session.  Pt was left in wheelchair with husband at bedside and call bell within reach.  Continue per current plan of care.   Session 2:  Pt was seen for additional, scheduled make up  session.  SLP facilitated the session with a functional snack of regular textures and thin liquids to continue working towards diet advancement.  Pt demonstrated no overt s/s of aspiration with solids or liquids and had no difficulty masticating and clearing advanced solids from the oral cavity.  As a result, recommend advancing pt to regular textures and thin liquids.  Continue per current plan of care.    Pain Pain Assessment Pain Scale: 0-10 Pain Score: 0-No pain  Therapy/Group: Individual Therapy  Avigdor Dollar, Melanee Spry 12/23/2017, 8:38 AM

## 2017-12-23 NOTE — Progress Notes (Signed)
Speech Language Pathology Discharge Summary  Patient Details  Name: Glenda Walker MRN: [id number] Date of Birth: [birth year]  Today's Date: 12/26/2017 SLP Individual Time: 0930-1030 SLP Individual Time Calculation (min): 60 min   Skilled Therapeutic Interventions:  Skilled treatment session focused on completing education with pt and husband regarding recommendation for safe discharge. SLP reviewed need for Min A to supervision at discharge. Reviewed creation of routine within home, compensatory memory strategies and husband was able to return knowledge of information. He was able to create examples of how to implement each strategy. All questions answered to their satisfaction.    Patient has met 6 of 6 long term goals.  Patient to discharge at overall Min;Supervision level.    Clinical Impression/Discharge Summary:   Pt has made functional gains while inpatient and is discharging having met 5 out of 6 short term goals.  Pt is currently min assist for tasks due to moderate cognitive deficits and is consuming a regular textures, thin liquids diet with mod I use of swallowing precautions.  Pt/family education is complete at this time.  Pt has demonstrated improved attention to tasks and toleration of a regular diet.  Pt is discharging home with recommendations for ST follow up at next level of care  Care Partner:  Caregiver Able to Provide Assistance: Yes  Type of Caregiver Assistance: Physical;Cognitive  Recommendation:  [facility] SLP;24 hour supervision/assistance  Rationale for SLP Follow Up: Maximize cognitive function and independence;Reduce caregiver burden   Equipment:     Reasons for discharge: Treatment goals met   Patient/Family Agrees with Progress Made and Goals Achieved: Yes    [name] 12/26/2017, 10:24 AM

## 2017-12-23 NOTE — Progress Notes (Signed)
Occupational Therapy Session Note  Patient Details  Name: Glenda Walker MRN: [id number] Date of Birth: 03/22/1955  Today's Date: 12/23/2017 OT Individual Time: 1115-1200 OT Individual Time Calculation (min): 45 min    Short Term Goals: Week 1:  OT Short Term Goal 1 (Week 1): Pt will complete Ub bathing and dressing with supervision. OT Short Term Goal 1 - Progress (Week 1): Not met OT Short Term Goal 2 (Week 1): Pt will complete LB bathing sit to stand with min assist. OT Short Term Goal 2 - Progress (Week 1): Not met OT Short Term Goal 3 (Week 1): Pt will complete LB dressing with mod assist sit to stand for 2 consecutive sessions. OT Short Term Goal 3 - Progress (Week 1): Met OT Short Term Goal 4 (Week 1): Pt will complete toilet transfers with mod assist stand pivot RW to 3:1 OT Short Term Goal 4 - Progress (Week 1): Met OT Short Term Goal 5 (Week 1): Pt will maintain sustained attention during selfcare tasks for 15 mins with no more than mod instructional cueing for re-direction. OT Short Term Goal 5 - Progress (Week 1): Not met Week 2:  OT Short Term Goal 1 (Week 2): Pt will maintain sustained attention during selfcare tasks for 15 mins with no more than mod instructional cueing for re-direction. OT Short Term Goal 2 (Week 2): Pt will complete UB bathing and dressing with supervision. OT Short Term Goal 3 (Week 2): Pt will complete LB bathing sit to stand with min assist. OT Short Term Goal 4 (Week 2): Pt will complete toilet transfer ambulating to the elevated toilet with rail and RW with supervision.      Skilled Therapeutic Interventions/Progress Updates:    Pt received in w/c already dressed for the day. Pt declined bathing as she had a bath yesterday. Pt's spouse present. Pt agreeable to exercise in the gym. She ambulated with RW with min A halfway to the gym and then needed to sit down to rest.  In the gym she worked on UE exercises with 2# dowel bar for chest presses and  rowing exercises.  Sit to stand while holding bar with mod A 3x.  She then worked on arm pushups on armrests for 5x with 3 sets.  Pt very talkative today, and reports she is feeling more energetic.  Discussed possibility of a shower, but need to check on if ok to shower with chest tubes covered.  Pt's spouse returned pt to her room.   Therapy Documentation Precautions:  Precautions Precautions: Fall Precaution Comments: trach Restrictions Weight Bearing Restrictions: No    Vital Signs: Therapy Vitals Temp: 98.5 F (36.9 C) Pulse Rate: 85 Resp: 19 Patient Position (if appropriate): Lying Oxygen Therapy O2 Device: Room Air Pain: Pain Assessment Pain Scale: 0-10 Pain Score: 0-No pain ADL: ADL Eating: Set up Where Assessed-Eating: Bed level Grooming: Minimal assistance Where Assessed-Grooming: Edge of bed Upper Body Bathing: Minimal assistance Where Assessed-Upper Body Bathing: Edge of bed Lower Body Bathing: Maximal assistance Where Assessed-Lower Body Bathing: Edge of bed Upper Body Dressing: Minimal assistance Where Assessed-Upper Body Dressing: Edge of bed Lower Body Dressing: Maximal assistance Where Assessed-Lower Body Dressing: Edge of bed Toileting: Maximal assistance   Therapy/Group: Individual Therapy  SAGUIER,JULIA 12/23/2017, 9:15 AM

## 2017-12-23 NOTE — Progress Notes (Signed)
Occupational Therapy Session Note  Patient Details  Name: MADICYN MESINA MRN: [id number] Date of Birth: [birth year]  Today's Date: 12/23/2017 OT Individual Time: 1330-1420 OT Individual Time Calculation (min): 50 min    Short Term Goals: Week 2:  OT Short Term Goal 1 (Week 2): Pt will maintain sustained attention during selfcare tasks for 15 mins with no more than mod instructional cueing for re-direction. OT Short Term Goal 2 (Week 2): Pt will complete UB bathing and dressing with supervision. OT Short Term Goal 3 (Week 2): Pt will complete LB bathing sit to stand with min assist. OT Short Term Goal 4 (Week 2): Pt will complete toilet transfer ambulating to the elevated toilet with rail and RW with supervision.   Skilled Therapeutic Interventions/Progress Updates:    Pt completed toilet transfer to the bathroom with use of the RW to start session.  She was able to complete with min guard assist including ambulating out to the sink and washing her hands.  She then transitioned to the therapy gym with min assist to work on standing endurance as well as cognitive processing.  She was able to complete sit to stand X 2 during session with min guard assist.  In standing at the high/low table had pt work on cognitive task of sequencing pegs according to diagram given.  She was able to complete 2 grids in a timely manner consisting of 8 and 10 pieces.  When attempted 15 piece grid she needed overall mod instructional cueing to complete, with pt initially getting 8 out of 15 correct.  Discussed progress with spouse at end of session with return to the room and pt left with husband and safety belt in place.    Therapy Documentation Precautions:  Precautions Precautions: Fall Precaution Comments: trach Restrictions Weight Bearing Restrictions: No  Pain: Pain Assessment Pain Scale: 0-10 Pain Score: 0-No pain  Therapy/Group: Individual Therapy  Semaj Coburn OTR/L 12/23/2017, 4:42 PM

## 2017-12-23 NOTE — Progress Notes (Signed)
[place] PHYSICAL MEDICINE & REHABILITATION PROGRESS NOTE  Subjective/Complaints:  Patient seen laying in bed this morning.  Husband is at bedside.  Patient and husband state that patient slept well overnight.  Slept fairly sleep chart.  Husband is very appreciative of care.  ROS:  Denies CP, SOB, nausea, vomiting, diarrhea  Objective: Vital Signs: Blood pressure 115/68, pulse 85, temperature 98.5 F (36.9 C), resp. rate 19, weight 69.5 kg, SpO2 100 %. Dg Chest 2 View  Result Date: 12/22/2017 CLINICAL DATA:  Somnolence. EXAM: CHEST - 2 VIEW COMPARISON:  12/10/2017 and prior studies FINDINGS: Cardiomegaly, CABG changes and atrial clip again noted. RIGHT pleural catheter and LEFT PICC line with tip overlying the LEFT axillary region again noted Pulmonary vascular congestion is present. Improved LEFT basilar aeration with trace pleural effusions noted. No pneumothorax. IMPRESSION: Improved LEFT basilar aeration without other significant change. Cardiomegaly, pulmonary vascular congestion and trace pleural effusions. Electronically Signed   By: Harmon Pier M.D.   On: 12/22/2017 11:54   Recent Labs    12/22/17 0846  WBC 7.1  HGB 7.8*  HCT 26.3*  PLT 345   Recent Labs    12/22/17 0846  NA 129*  K 4.7  CL 95*  CO2 24  GLUCOSE 163*  BUN 29*  CREATININE 1.20*  CALCIUM 10.6*    Physical Exam: Today's Vitals   12/22/17 1953 12/22/17 2053 12/23/17 0616 12/23/17 0747  BP:      Pulse:   85   Resp:   19   Temp:   98.5 F (36.9 C)   TempSrc:      SpO2:      Weight:      PainSc: 3  Asleep  0-No pain   Body mass index is 24.73 kg/m.  Constitutional: She appears well-developed. Frail HENT: Normocephalic and atraumatic.  Eyes: EOM are normal.  No discharge.  Neck: Stoma dressed Cardiovascular:  Irregularly irregular.  No JVD. Respiratory: Effort normal.  Clear. Right lower lateral chest with Pleurex catheter in place with dry dressing.  GI: Mildly distended. Bowel sounds  are normal, unchanged.  Musculoskeletal: No edema or tenderness in extremities Neurological: She is alert and oriented, arousable Delayed and slow speech,  improved.  She was able to follow basic motor commands.  Motor: Grossly 4/5 throughout,  stable Skin: Skin is warm and dry.  Psychiatric: Her affect is blunt. Her speech is delayed. She is slowedand withdrawn, improving overall.   Assessment/Plan: 1. Functional deficits secondary to debility which require 3+ hours per day of interdisciplinary therapy in a comprehensive inpatient rehab setting.  Physiatrist is providing close team supervision and 24 hour management of active medical problems listed below.  Physiatrist and rehab team continue to assess barriers to discharge/monitor patient progress toward functional and medical goals  Care Tool:  Bathing    Body parts bathed by patient: Right arm, Left arm, Chest, Abdomen, Front perineal area, Buttocks, Right upper leg, Left upper leg, Right lower leg, Left lower leg, Face   Body parts bathed by helper: Buttocks Body parts n/a: Chest, Abdomen, Front perineal area, Buttocks   Bathing assist Assist Level: Minimal Assistance - Patient > 75%     Upper Body Dressing/Undressing Upper body dressing   What is the patient wearing?: Pull over shirt    Upper body assist Assist Level: Supervision/Verbal cueing    Lower Body Dressing/Undressing Lower body dressing      What is the patient wearing?: Pants     Lower body assist  Assist for lower body dressing: Minimal Assistance - Patient > 75%     Toileting Toileting Toileting Activity did not occur (Clothing management and hygiene only): N/A (no void or bm)  Toileting assist Assist for toileting: Moderate Assistance - Patient 50 - 74% Assistive Device Comment: RW   Transfers Chair/bed transfer  Transfers assist     Chair/bed transfer assist level: Minimal Assistance - Patient > 75%      Locomotion Ambulation   Ambulation assist   Ambulation activity did not occur: Safety/medical concerns  Assist level: Minimal Assistance - Patient > 75% Assistive device: Walker-rolling Max distance: 117ft   Walk 10 feet activity   Assist     Assist level: Minimal Assistance - Patient > 75% Assistive device: Walker-rolling   Walk 50 feet activity   Assist Walk 50 feet with 2 turns activity did not occur: Safety/medical concerns  Assist level: Minimal Assistance - Patient > 75% Assistive device: Walker-rolling    Walk 150 feet activity   Assist Walk 150 feet activity did not occur: Safety/medical concerns         Walk 10 feet on uneven surface  activity   Assist Walk 10 feet on uneven surfaces activity did not occur: Safety/medical concerns         Wheelchair     Assist   Type of Wheelchair: Manual    Wheelchair assist level: Moderate Assistance - Patient 50 - 74% Max wheelchair distance: 75    Wheelchair 50 feet with 2 turns activity    Assist    Wheelchair 50 feet with 2 turns activity did not occur: Safety/medical concerns   Assist Level: Moderate Assistance - Patient 50 - 74%   Wheelchair 150 feet activity     Assist Wheelchair 150 feet activity did not occur: Safety/medical concerns          Medical Problem List and Plan: 1. Limitation in self-care, endurance, dysphagia, and mobility secondary to debility.             Continue CIR 2. DVT Prophylaxis/Anticoagulation: Mechanical: Sequential compression devices, below knee Bilateral lower extremities.  Lovenox started on 10/1, changed to Xarelto on 10/11.             Dopplers negative for DVT 3. Pain Management: Tylenol prn 4. Mood: LCSW to follow for evaluation and support.  5. Neuropsych: This patient is not fully capable of making decisions on her own behalf. 6. Skin/Wound Care: Routine pressure relief measures. Maintain adequate nutritional hydration status.  Added supplements 3 times daily. 7. Fluids/Electrolytes/Nutrition: Monitor I's and O's.              Advance to D3 thins on 10/8, continue to advance as tolerated 8. Acute on chronic respiratory failure: Question history of OSA.              Decannulated on 10/7 without issue 9. Right pleural effusion: Right Pleurx drain placed 09/17 by radiology. Drain Pleurx daily-monitor for any signs of bleeding.   Chest x-ray ordered on 10/10  ABG on 10/10 relatively unremarkable 10. Chronic systolic CHF: Monitor weights daily for trends and watch for other signs of overload. On spironolactone, lasix and low dose ASA. No statin due to cirrhosis.  Filed Weights   12/20/17 0440 12/21/17 0411 12/22/17 0500  Weight: 68.9 kg 69.6 kg 69.5 kg               Stable on 10/11 11. Chronic A fib: Monitor heart rate twice daily--resumed low dose  BB due to tachycardia.   Was on Xarelto at home due to embolic stroke, resumed Xarelto started 10/11             Monitor heart rate with increased mobility 12. CAD s/p CABG/ICM: Will need ICD in the future.  13. H/o R-MCA stroke: Has been off Xarelto since June per husband/records. Continue low dose ASA.              Xarelto restarted on 10/11 14. H/o depression: Stable on paxil. Also on modafinil for activation.  15. GIB with hematochezia due to esophagitis: On PPI bid.            Hemoglobin 7.8 on 10/10  Hemoccult negative             Cont to monitor 16. Cirrhosis of the liver/Hepatic encephalopathy: Continue Rifaximin (off lactulose)             Ammonia r within normal limits on 10/10 17. T2DM: Hgb A1C- 5.5. Her intake variable therefore will monitor BS ac/hs and use SSI for now.              Slightly labile on 10/11 18. Constipation: No BM since 9/24 per nursing. Increased miralax to bid. May need to resume lactulose. 19. Code status: Husband wants full code now that patient is getting better.  20. Hyponatremia  Sodium 129 on 10/10  Continue to  monitor 21. AKI             Creatinine 1.20 on 10/10             Encourage fluids   LOS: 11 days A FACE TO FACE EVALUATION WAS PERFORMED  Ankit Karis Juba 12/23/2017, 8:00 AM

## 2017-12-24 ENCOUNTER — Inpatient Hospital Stay (HOSPITAL_COMMUNITY): Payer: Medicare Other | Admitting: Occupational Therapy

## 2017-12-24 LAB — GLUCOSE, CAPILLARY
GLUCOSE-CAPILLARY: 233 mg/dL — AB (ref 70–99)
GLUCOSE-CAPILLARY: 122 mg/dL — AB (ref 70–99)
Glucose-Capillary: 141 mg/dL — ABNORMAL HIGH (ref 70–99)
Glucose-Capillary: 116 mg/dL — ABNORMAL HIGH (ref 70–99)

## 2017-12-24 NOTE — Progress Notes (Addendum)
Referring Physician(s): * No referring provider recorded for this case *  Supervising Physician: Richarda Overlie  Patient Status:  [facility] - In-pt  Chief Complaint: Recurrent right pleural effusion  Subjective: Patient had a PleurX catheter placed 11/29/17 for recurrent right pleural effusion while on [facility]. At that time patient was on trach/vent with signs of distress with only minimal accumulation of her pleural effusion. After much discussion, plan was made to move forward with Pleurx catheter placement to assist in weaning from the vent.  Patient was ultimately able to wean and her condition improved. She was discharged from [facility] to inpatient rehab at [facility].  Her PleurX remains in place.  IR consulted for possible removal, if appropriate.   Patient sitting up in wheel chair at time of visit.  Son at bedside.  Denies concerns related to Pleurx catheter.  States her husband has been managing the tube without issues. They have been draining daily with 100-350 mL output.   Allergies: Hydrocodone  Medications: Prior to Admission medications   Medication Sig Start Date End Date Taking? Authorizing Provider  acetaminophen (TYLENOL) 325 MG tablet 650 mg every 6 (six) hours as needed for mild pain, moderate pain or fever.   Yes [provider]  ALPRAZolam Prudy Feeler) 0.5 MG tablet Take 0.5 mg by mouth every 6 (six) hours as needed for anxiety.   Yes [provider]  Amino Acids-Protein Hydrolys (FEEDING SUPPLEMENT, PRO-STAT 64,) LIQD Take 30 mLs by mouth 3 (three) times daily.   Yes [provider]  antiseptic oral rinse (BIOTENE) LIQD 15 mLs by Mouth Rinse route as needed for dry mouth.   Yes [provider]  aspirin EC 81 MG tablet 81 mg daily.    Yes [provider]  dextrose 50 % solution Inject 11-25 mLs into the vein as needed for low blood sugar. Per sliding scale  22 give 25 ml 22-28 give 23ml 29-35 give 19  ml 36-41 give 17ml 42-48 give 15ml 49-55 give 13ml 56-61 give 11ml   Yes [provider]  Dextrose-Sodium Chloride (DEXTROSE 5 % AND 0.45% NACL) infusion Inject 1,000 mLs into the vein continuous as needed. Drip rate 50 ml/hr   Yes [provider]  furosemide (LASIX) 20 MG tablet 40 mg 2 (two) times daily.   Yes [provider]  insulin lispro (HUMALOG) 100 UNIT/ML injection Inject 0-12 Units into the skin 4 (four) times daily -  before meals and at bedtime. Per sliding scale  70-150 = 0u 151-200=2u 201-250=4u 251-300=6u 301-350=8u 351-400=10u >400 give 12u, call MD and obtain stat lab verification.   Yes [provider]  levothyroxine (SYNTHROID, LEVOTHROID) 150 MCG tablet 150 mcg daily at 6 (six) AM.    Yes [provider]  magnesium oxide (MAG-OX) 400 (241.3 Mg) MG tablet 400 mg every 6 (six) hours as needed. 1.6-1.7 400mg  q6h x 4 doses If mag is <1.5 meq see iv administration  Repeat level 8 hors after replacment    Yes [provider]  magnesium sulfate 1-5 GM/100ML-% INFUSION Inject 2 g into the vein as needed (for magnesium levels). If MG < 0.9 call MD 0.9-1.1 give 2g IV over 1hr x 3 doses 1.2-1.4 give 2g IV over 1hr x 2 doses 1.5-1.7 give 2g IV over 1hr x 1 dose  Repeat magnesium level every 8 hours after replacment.   Yes [provider]  Melatonin 3 MG TABS Place 3 mg into feeding tube at bedtime.  Yes [provider]  metoCLOPramide (REGLAN) 5 MG tablet 5 mg every 6 (six) hours.    Yes [provider]  Metoprolol Tartrate (METOPROLOL TARTARATE 1 MG/ML SYRINGE, ,) Inject 5 mg into the vein every 6 (six) hours as needed (high heart rate).   Yes [provider]  modafinil (PROVIGIL) 100 MG tablet Place 100 mg into feeding tube daily.    Yes [provider]  morphine 2 MG/ML injection Inject 1 mg into the vein every 6 (six) hours as needed (pain).   Yes [provider]   Omega-3 Fatty Acids (THE VERY FINEST FISH OIL) LIQD Place 4,000 mg into feeding tube daily.    Yes [provider]  ondansetron (ZOFRAN) 4 MG/2ML SOLN injection Inject 4 mg into the vein every 6 (six) hours as needed for nausea or vomiting.   Yes [provider]  pantoprazole sodium (PROTONIX) 40 mg/20 mL PACK 40 mg 2 (two) times daily.   Yes [provider]  PARoxetine (PAXIL) 20 MG tablet 20 mg daily.    Yes [provider]  polyethylene glycol (MIRALAX / GLYCOLAX) packet 17 g every other day.    Yes [provider]  Potassium Bicarbonate (POTASSIUM EFFERVESCENT PO) 40 mEq every 4 (four) hours as needed.    Yes [provider]  potassium chloride 10 MEQ/100ML Inject 10 mEq into the vein as needed. Below 2.6 notify MD, SKG atat, IV q4h x 3 doses  2.7-3.0 IV q4h x 2 doses  3.1-3.4 IV q4h x 1 dose 3.5-5.1 no additional KCL   Yes [provider]  potassium chloride SA (K-DUR,KLOR-CON) 20 MEQ tablet Take 20 mEq by mouth every 4 (four) hours as needed. 3.1-3.4 po x 1 dose  2.7-3.0 q4h x2 doses  Below 2.6 see IV administration 09/27/17  Yes [provider]  rifaximin (XIFAXAN) 550 MG TABS tablet Take 550 mg by mouth 2 (two) times daily.   Yes [provider]  simethicone (MYLICON) 80 MG chewable tablet Chew 80 mg by mouth 4 (four) times daily.   Yes [provider]  sodium polystyrene (KAYEXALATE) 15 GM/60ML suspension 15-30 g as needed (give 30g for k > 5.5 meq/L. notify MD). Give 15g for k 5.2-5.5 give 30g for k > 5.5 meq/L. notify MD   Yes [provider]  spironolactone (ALDACTONE) 25 MG tablet 25 mg 2 (two) times daily.    Yes [provider]  heparin 5000 UNIT/ML injection Inject 5,000 Units into the skin every 8 (eight) hours. Rotate injection site    [provider]    Vital Signs: BP 102/75 (BP Location: Right Arm)   Pulse (!) 121   Temp 97.7  F (36.5 C)   Resp 16   Wt 161 lb 9.6 oz (73.3 kg)   LMP  (LMP Unknown)   SpO2 100%   BMI 26.08 kg/m   Physical Exam  NAD, alert Chest:  R PleurX in place. Non-tender. No issues with site.  Serous-appearing fluid in tubing.   Imaging: Dg Chest 2 View  Result Date: 12/22/2017 CLINICAL DATA:  Somnolence. EXAM: CHEST - 2 VIEW COMPARISON:  12/10/2017 and prior studies FINDINGS: Cardiomegaly, CABG changes and atrial clip again noted. RIGHT pleural catheter and LEFT PICC line with tip overlying the LEFT axillary region again noted Pulmonary vascular congestion is present. Improved LEFT basilar aeration with trace pleural effusions noted. No pneumothorax. IMPRESSION: Improved LEFT basilar aeration without other significant change. Cardiomegaly,  pulmonary vascular congestion and trace pleural effusions. Electronically Signed   By: Harmon Pier M.D.   On: 12/22/2017 11:54    Labs:  CBC: Recent Labs    12/11/17 0521 12/13/17 0751 12/19/17 0403 12/22/17 0846  WBC 7.4 9.2 6.9 7.1  HGB 8.5* 9.0* 7.8* 7.8*  HCT 27.4* 29.1* 25.7* 26.3*  PLT 263 280 288 345    COAGS: Recent Labs    11/01/17 0521 11/10/17 0944  INR 1.15 1.23    BMP: Recent Labs    12/13/17 0751 12/15/17 0454 12/19/17 0403 12/22/17 0846  NA 129* 129* 129* 129*  K 4.0 4.9 5.0 4.7  CL 94* 97* 98 95*  CO2 25 25 24 24   GLUCOSE 109* 151* 124* 163*  BUN 33* 29* 24* 29*  CALCIUM 10.3 9.9 10.1 10.6*  CREATININE 1.31* 1.18* 1.25* 1.20*  GFRNONAA 43* 49* 45* 48*  GFRAA 50* 56* 53* 55*    LIVER FUNCTION TESTS: Recent Labs    11/01/17 0521  11/08/17 0530  11/27/17 0724  12/01/17 0602 12/03/17 0701 12/05/17 0700 12/13/17 0751  BILITOT 1.2  --  1.1  --  0.8  --   --   --   --  0.6  AST 20  --  33  --  24  --   --   --   --  23  ALT 15  --  25  --  18  --   --   --   --  18  ALKPHOS 139*  --  302*  --  181*  --   --   --   --  180*  PROT 5.8*  --  6.3*  --  5.2*  --   --   --   --  7.4  ALBUMIN 2.2*   < >  2.2*   < > 1.9*   < > 2.1* 2.2* 2.3* 2.8*   < > = values in this interval not displayed.    Assessment and Plan: Recurrent right pleural effusion Patient states she feel she/family are leaning towards keeping PleurX catheter at this time.  Discussed all options including: (1) keeping the PleurX, continue current draining schedule (2) keeping the Pleurx, draining less often or only when she feels symptomatic.  (3) Removing the Pleurx and resuming thoracentesis for likely recurrent effusion as needed.   Discussed with Dr. Lowella Dandy who recommends not draining for several days to see if fluid reaccumulates and causes smyptoms. Biggest risk for Pleurx remaining in place is infection.  Patient will also need home health supplies delivered by a managing service (rehab, PCP, etc..).   Electronically Signed: Hoyt Koch, PA 12/24/2017, 2:17 PM   I spent a total of 15 Minutes at the the patient's bedside AND on the patient's hospital floor or unit, greater than 50% of which was counseling/coordinating care for recurrent right pleural effusion.

## 2017-12-24 NOTE — Progress Notes (Signed)
Occupational Therapy Session Note  Patient Details  Name: Glenda Walker MRN: [id number] Date of Birth: 09/01/1955  Today's Date: 12/24/2017 OT Individual Time: 0900-0930 OT Individual Time Calculation (min): 30 min    Short Term Goals: Week 1:  OT Short Term Goal 1 (Week 1): Pt will complete Ub bathing and dressing with supervision. OT Short Term Goal 1 - Progress (Week 1): Not met OT Short Term Goal 2 (Week 1): Pt will complete LB bathing sit to stand with min assist. OT Short Term Goal 2 - Progress (Week 1): Not met OT Short Term Goal 3 (Week 1): Pt will complete LB dressing with mod assist sit to stand for 2 consecutive sessions. OT Short Term Goal 3 - Progress (Week 1): Met OT Short Term Goal 4 (Week 1): Pt will complete toilet transfers with mod assist stand pivot RW to 3:1 OT Short Term Goal 4 - Progress (Week 1): Met OT Short Term Goal 5 (Week 1): Pt will maintain sustained attention during selfcare tasks for 15 mins with no more than mod instructional cueing for re-direction. OT Short Term Goal 5 - Progress (Week 1): Not met Week 2:  OT Short Term Goal 1 (Week 2): Pt will maintain sustained attention during selfcare tasks for 15 mins with no more than mod instructional cueing for re-direction. OT Short Term Goal 2 (Week 2): Pt will complete UB bathing and dressing with supervision. OT Short Term Goal 3 (Week 2): Pt will complete LB bathing sit to stand with min assist. OT Short Term Goal 4 (Week 2): Pt will complete toilet transfer ambulating to the elevated toilet with rail and RW with supervision.       Skilled Therapeutic Interventions/Progress Updates:    Pt received in w/c completing medication routine with nursing. Pt fully dressed and agreeable to working on sit ><stand and standing balance exercises.  Pt stood from w/c to RW consistently with S.  In standing, she worked on decreasing UE support on RW while marching in place, forward toe taps, and lateral hip sways for 3  sets total.  Pt worked on overhead reaching in standing with single arms and then both arms with close S.  Pt tolerated exercises well.  Pt resting in w/c with chair belt alarm on and all needs met.   Therapy Documentation Precautions:  Precautions Precautions: Fall Precaution Comments: trach Restrictions Weight Bearing Restrictions: No     Pain: Pain Assessment Pain Scale: 0-10 Pain Score: 0-No pain ADL: ADL Eating: Set up Where Assessed-Eating: Bed level Grooming: Minimal assistance Where Assessed-Grooming: Edge of bed Upper Body Bathing: Minimal assistance Where Assessed-Upper Body Bathing: Edge of bed Lower Body Bathing: Maximal assistance Where Assessed-Lower Body Bathing: Edge of bed Upper Body Dressing: Minimal assistance Where Assessed-Upper Body Dressing: Edge of bed Lower Body Dressing: Maximal assistance Where Assessed-Lower Body Dressing: Edge of bed Toileting: Maximal assistance    Therapy/Group: Individual Therapy  Vicki Chaffin 12/24/2017, 12:49 PM

## 2017-12-24 NOTE — Progress Notes (Addendum)
[place] PHYSICAL MEDICINE & REHABILITATION PROGRESS NOTE  Subjective/Complaints:   Oriented to person and [place] not cone, oriented to Sat  ROS:  Denies CP, SOB, nausea, vomiting, diarrhea  Objective: Vital Signs: Blood pressure 102/75, pulse (!) 121, temperature 97.7 F (36.5 C), resp. rate 16, weight 73.3 kg, SpO2 100 %. Dg Chest 2 View  Result Date: 12/22/2017 CLINICAL DATA:  Somnolence. EXAM: CHEST - 2 VIEW COMPARISON:  12/10/2017 and prior studies FINDINGS: Cardiomegaly, CABG changes and atrial clip again noted. RIGHT pleural catheter and LEFT PICC line with tip overlying the LEFT axillary region again noted Pulmonary vascular congestion is present. Improved LEFT basilar aeration with trace pleural effusions noted. No pneumothorax. IMPRESSION: Improved LEFT basilar aeration without other significant change. Cardiomegaly, pulmonary vascular congestion and trace pleural effusions. Electronically Signed   By: Harmon Pier M.D.   On: 12/22/2017 11:54   Recent Labs    12/22/17 0846  WBC 7.1  HGB 7.8*  HCT 26.3*  PLT 345   Recent Labs    12/22/17 0846  NA 129*  K 4.7  CL 95*  CO2 24  GLUCOSE 163*  BUN 29*  CREATININE 1.20*  CALCIUM 10.6*    Physical Exam: Today's Vitals   12/24/17 0014 12/24/17 0114 12/24/17 0532 12/24/17 0617  BP:   102/75   Pulse:   (!) 121   Resp:   16   Temp:   97.7 F (36.5 C)   TempSrc:      SpO2:   100%   Weight:    73.3 kg  PainSc: 5  Asleep     Body mass index is 26.08 kg/m.  Constitutional: She appears well-developed. Frail HENT: Normocephalic and atraumatic.  Eyes: EOM are normal.  No discharge.  Neck: Stoma dressed Cardiovascular:  Irregularly irregular.  No JVD. Respiratory: Effort normal.  Clear. Right lower lateral chest with Pleurex catheter in place with dry dressing.  GI: Mildly distended. Bowel sounds are normal, unchanged.  Musculoskeletal: No edema or tenderness in extremities Neurological: She is alert and  oriented, slightly somnolent Delayed and slow speech,  unchanged.  She was able to follow basic motor commands.  Motor: Grossly 4/5 throughout,  unchanged Skin: Skin is warm and dry.  Psychiatric: Her affect is blunt. Her speech is delayed. She is slowedand withdrawn, improving overall.  Ext no pitting edema in LE Assessment/Plan: 1. Functional deficits secondary to debility which require 3+ hours per day of interdisciplinary therapy in a comprehensive inpatient rehab setting.  Physiatrist is providing close team supervision and 24 hour management of active medical problems listed below.  Physiatrist and rehab team continue to assess barriers to discharge/monitor patient progress toward functional and medical goals  Care Tool:  Bathing    Body parts bathed by patient: Right arm, Left arm, Chest, Abdomen, Front perineal area, Buttocks, Right upper leg, Left upper leg, Right lower leg, Left lower leg, Face   Body parts bathed by helper: Buttocks Body parts n/a: Chest, Abdomen, Front perineal area, Buttocks   Bathing assist Assist Level: Minimal Assistance - Patient > 75%     Upper Body Dressing/Undressing Upper body dressing   What is the patient wearing?: Pull over shirt    Upper body assist Assist Level: Supervision/Verbal cueing    Lower Body Dressing/Undressing Lower body dressing      What is the patient wearing?: Pants     Lower body assist Assist for lower body dressing: Minimal Assistance - Patient > 75%  Toileting Toileting Toileting Activity did not occur Press photographer and hygiene only): N/A (no void or bm)  Toileting assist Assist for toileting: Contact Guard/Touching assist Assistive Device Comment: RW   Transfers Chair/bed transfer  Transfers assist     Chair/bed transfer assist level: Minimal Assistance - Patient > 75%     Locomotion Ambulation   Ambulation assist   Ambulation activity did not occur: Safety/medical  concerns  Assist level: Contact Guard/Touching assist Assistive device: Walker-rolling Max distance: 49ft    Walk 10 feet activity   Assist     Assist level: Supervision/Verbal cueing Assistive device: Walker-rolling   Walk 50 feet activity   Assist Walk 50 feet with 2 turns activity did not occur: Safety/medical concerns  Assist level: Contact Guard/Touching assist Assistive device: Walker-rolling    Walk 150 feet activity   Assist Walk 150 feet activity did not occur: Safety/medical concerns         Walk 10 feet on uneven surface  activity   Assist Walk 10 feet on uneven surfaces activity did not occur: Safety/medical concerns         Wheelchair     Assist   Type of Wheelchair: Manual    Wheelchair assist level: Moderate Assistance - Patient 50 - 74% Max wheelchair distance: 75    Wheelchair 50 feet with 2 turns activity    Assist    Wheelchair 50 feet with 2 turns activity did not occur: Safety/medical concerns   Assist Level: Moderate Assistance - Patient 50 - 74%   Wheelchair 150 feet activity     Assist Wheelchair 150 feet activity did not occur: Safety/medical concerns          Medical Problem List and Plan: 1. Limitation in self-care, endurance, dysphagia, and mobility secondary to debility.             Continue CIR PT, OT, SLP 2. DVT Prophylaxis/Anticoagulation: Mechanical: Sequential compression devices, below knee Bilateral lower extremities.  Lovenox started on 10/1.             Dopplers negative for DVT 3. Pain Management: Tylenol prn 4. Mood: LCSW to follow for evaluation and support.  5. Neuropsych: This patient is not fully capable of making decisions on her own behalf. 6. Skin/Wound Care: Routine pressure relief measures. Maintain adequate nutritional hydration status. Added supplements 3 times daily. 7. Fluids/Electrolytes/Nutrition: Monitor I's and O's.              Advance to D3 thins on 10/8, continue  to advance as tolerated 8. Acute on chronic respiratory failure: Question history of OSA.              Decannulated on 10/7 without issue 9. Right pleural effusion: Right Pleurx drain placed 09/17 by radiology. Drain Pleurx daily-monitor for any signs of bleeding.   Chest x-ray ordered on 10/10  ABG ordered on 10/10 10. Chronic systolic CHF: Monitor weights daily for trends and watch for other signs of overload. On spironolactone, lasix and low dose ASA. No statin due to cirrhosis.  Filed Weights   12/21/17 0411 12/22/17 0500 12/24/17 0617  Weight: 69.6 kg 69.5 kg 73.3 kg            wt up on 10/12 no signs of peripheral edema, no abnormal lung sounds 11. Chronic A fib: Monitor heart rate twice daily--resumed low dose BB due to tachycardia.   Was on Xarelto at home due to embolic stroke, will consider restarting after labs tomorrow  Monitor heart rate with increased mobility 12. CAD s/p CABG/ICM: Will need ICD in the future.  13. H/o R-MCA stroke: Has been off Xarelto since June per husband/records. Continue low dose ASA.              Will consider restarting pending lab work 14. H/o depression: Stable on paxil. Also on modafinil for activation.  15. GIB with hematochezia due to esophagitis: On PPI bid.            Hemoglobin 7.8 on 10/7  Hemoccult negative             Cont to monitor 16. Cirrhosis of the liver/Hepatic encephalopathy: Continue Rifaximin (off lactulose)             Ammonia remains elevated at 36 on 10/1, repeat ordered on 10/10 17. T2DM: Hgb A1C- 5.5. Her intake variable therefore will monitor BS ac/hs and use SSI for now.              Slightly elevated on 10/10, however relatively controlled otherwise 18. Constipation: No BM since 9/24 per nursing. Increased miralax to bid. May need to resume lactulose. 19. Code status: Husband wants full code now that patient is getting better.  20. Hyponatremia  Sodium 129 on 10/7 and 10/10   Continue to  monitor 21. AKI             Creatinine 1.25 on 10/7, 1.20 on 10/10                LOS: 12 days A FACE TO FACE EVALUATION WAS PERFORMED  Erick Colace 12/24/2017, 7:33 AM

## 2017-12-25 ENCOUNTER — Inpatient Hospital Stay (HOSPITAL_COMMUNITY): Payer: Medicare Other

## 2017-12-25 ENCOUNTER — Inpatient Hospital Stay (HOSPITAL_COMMUNITY): Payer: Medicare Other | Admitting: Physical Therapy

## 2017-12-25 LAB — GLUCOSE, CAPILLARY
GLUCOSE-CAPILLARY: 144 mg/dL — AB (ref 70–99)
Glucose-Capillary: 114 mg/dL — ABNORMAL HIGH (ref 70–99)
Glucose-Capillary: 117 mg/dL — ABNORMAL HIGH (ref 70–99)
Glucose-Capillary: 118 mg/dL — ABNORMAL HIGH (ref 70–99)

## 2017-12-25 LAB — OCCULT BLOOD X 1 CARD TO LAB, STOOL
FECAL OCCULT BLD: NEGATIVE
Fecal Occult Bld: NEGATIVE

## 2017-12-25 NOTE — Progress Notes (Signed)
Physical Therapy Session Note  Patient Details  Name: Glenda Walker MRN: [id number] Date of Birth: 26-Jan-1956  Today's Date: 12/25/2017 PT Individual Time: 0900-0958 PT Individual Time Calculation (min): 58 min   Short Term Goals: Week 2:  PT Short Term Goal 1 (Week 2): STG=LTG due to ELOS  Skilled Therapeutic Interventions/Progress Updates:    Pt seated in w/c upon PT arrival, agreeable to therapy tx and denies pain. Pt transported to the gym in w/c. Pt ambulated x 80 ft with RW and CGA working on endurance. Pt ambulated from gym>rehab apartment x60 ft with RW and CGA, pt performed transfer on/off rehab bed and performed bed mobility with supervision. Pt ambulated back to gym x 60 ft with RW and CGA. Pt worked on dynamic standing balance this session without UE support while tossing horseshoes, x 2 trials with min guard assist. Pt transported to the dayroom and transferred to nustep with supervision, stand pivot. Pt used nustep x 6 minutes for global strengthening and endurance. Pt transported to ortho gym, and performed car transfer from w/c<>car with min assist stand pivot. Pt transported back to room and left seated in w/c in care of husband.   Therapy Documentation Precautions:  Precautions Precautions: Fall Precaution Comments: trach Restrictions Weight Bearing Restrictions: No    Therapy/Group: Individual Therapy  Cresenciano Genre, PT, DPT 12/25/2017, 7:54 AM

## 2017-12-25 NOTE — Progress Notes (Signed)
Physical Therapy Session Note  Patient Details  Name: Glenda Walker MRN: [id number] Date of Birth: [birth year]  Today's Date: 12/25/2017 PT Individual Time: 1100-1156 PT Individual Time Calculation (min): 56 min   Short Term Goals: Week 1:  PT Short Term Goal 1 (Week 1): pt will perform functional transfers with mod A PT Short Term Goal 1 - Progress (Week 1): Met PT Short Term Goal 2 (Week 1): pt will perform gait with max A x 10' in controlled environment PT Short Term Goal 2 - Progress (Week 1): Met  Skilled Therapeutic Interventions/Progress Updates:  Pt was seen bedside in the am. Pt performed multiple sit to stand transfers with rolling walker and S. Pt performed stand pivot transfers with rolling walker and S to c/g with verbal cues. Pt ambulated 75 feet x 2 with rolling walker and c/g with verbal cues. Pt performed step taps 3 sets x 10 reps each. Pt performed slalom course 2 x 40 feet and 20 feet with rolling walker and c/g with verbal cues. Pt c/o headache atend of session, vitals as above. Pt returned to room and notified pt's nurse. Pt sitting up in w/c with lunch and husband at bedside. Pt requires occasional verbal cues and redirection to remain focused on tasks during treatment session.   Therapy Documentation Precautions:  Precautions Precautions: Fall Precaution Comments: trach Restrictions Weight Bearing Restrictions: No General:  VITALS:  BP 91/59 HR 87 O2 sats: 100%  Pain: No c/o pain.    Therapy/Group: Individual Therapy  Dub Amis 12/25/2017, 10:48 AM

## 2017-12-25 NOTE — Progress Notes (Signed)
[place] PHYSICAL MEDICINE & REHABILITATION PROGRESS NOTE  Subjective/Complaints:   Husband took pt to bathroom, normal stool on commode No airleak from trach site  ROS:  Denies CP, SOB, nausea, vomiting, diarrhea  Objective: Vital Signs: Blood pressure 110/67, pulse 86, temperature 97.6 F (36.4 C), resp. rate 18, weight 73.3 kg, SpO2 100 %. No results found. Recent Labs    12/22/17 0846  WBC 7.1  HGB 7.8*  HCT 26.3*  PLT 345   Recent Labs    12/22/17 0846  NA 129*  K 4.7  CL 95*  CO2 24  GLUCOSE 163*  BUN 29*  CREATININE 1.20*  CALCIUM 10.6*    Physical Exam: Today's Vitals   12/24/17 1532 12/24/17 2051 12/24/17 2132 12/25/17 0501  BP: 110/74 105/70  110/67  Pulse: 78 79  86  Resp: 19 16  18   Temp:  (!) 97.3 F (36.3 C)  97.6 F (36.4 C)  TempSrc:  Oral    SpO2:  100%  100%  Weight:      PainSc:   8     Body mass index is 26.08 kg/m.  Constitutional: She appears well-developed. Frail HENT: Normocephalic and atraumatic.  Eyes: EOM are normal.  No discharge.  Neck: Stoma dressed Cardiovascular:  Irregularly irregular.  No JVD. Respiratory: Effort normal.  Clear. Right lower lateral chest with Pleurex catheter in place with dry dressing.  GI: Mildly distended. Bowel sounds are normal, unchanged.  Musculoskeletal: No edema or tenderness in extremities Neurological: She is alert and oriented, slightly somnolent Delayed and slow speech,  unchanged.  She was able to follow basic motor commands.  Motor: Grossly 4/5 throughout,  unchanged Skin: Skin is warm and dry.  Psychiatric: Her affect is blunt. Her speech is delayed. She is slowedand withdrawn, improving overall.  Ext no pitting edema in LE Assessment/Plan: 1. Functional deficits secondary to debility which require 3+ hours per day of interdisciplinary therapy in a comprehensive inpatient rehab setting.  Physiatrist is providing close team supervision and 24 hour management of active  medical problems listed below.  Physiatrist and rehab team continue to assess barriers to discharge/monitor patient progress toward functional and medical goals  Care Tool:  Bathing    Body parts bathed by patient: Right arm, Left arm, Chest, Abdomen, Front perineal area, Buttocks, Right upper leg, Left upper leg, Right lower leg, Left lower leg, Face   Body parts bathed by helper: Buttocks Body parts n/a: Chest, Abdomen, Front perineal area, Buttocks   Bathing assist Assist Level: Minimal Assistance - Patient > 75%     Upper Body Dressing/Undressing Upper body dressing   What is the patient wearing?: Pull over shirt, Button up shirt    Upper body assist Assist Level: Minimal Assistance - Patient > 75%    Lower Body Dressing/Undressing Lower body dressing      What is the patient wearing?: Pants     Lower body assist Assist for lower body dressing: Minimal Assistance - Patient > 75%     Toileting Toileting Toileting Activity did not occur (Clothing management and hygiene only): N/A (no void or bm)  Toileting assist Assist for toileting: Minimal Assistance - Patient > 75% Assistive Device Comment: RW   Transfers Chair/bed transfer  Transfers assist     Chair/bed transfer assist level: Supervision/Verbal cueing     Locomotion Ambulation   Ambulation assist   Ambulation activity did not occur: Safety/medical concerns  Assist level: Contact Guard/Touching assist Assistive device: Walker-rolling Max distance: 16ft  Walk 10 feet activity   Assist     Assist level: Contact Guard/Touching assist Assistive device: Walker-rolling   Walk 50 feet activity   Assist Walk 50 feet with 2 turns activity did not occur: Safety/medical concerns  Assist level: Contact Guard/Touching assist Assistive device: Walker-rolling    Walk 150 feet activity   Assist Walk 150 feet activity did not occur: Safety/medical concerns         Walk 10 feet on uneven  surface  activity   Assist Walk 10 feet on uneven surfaces activity did not occur: Safety/medical concerns         Wheelchair     Assist   Type of Wheelchair: Manual    Wheelchair assist level: Moderate Assistance - Patient 50 - 74% Max wheelchair distance: 75    Wheelchair 50 feet with 2 turns activity    Assist    Wheelchair 50 feet with 2 turns activity did not occur: Safety/medical concerns   Assist Level: Moderate Assistance - Patient 50 - 74%   Wheelchair 150 feet activity     Assist Wheelchair 150 feet activity did not occur: Safety/medical concerns          Medical Problem List and Plan: 1. Limitation in self-care, endurance, dysphagia, and mobility secondary to debility.             Continue CIR PT, OT, SLP 2. DVT Prophylaxis/Anticoagulation: Mechanical: Sequential compression devices, below knee Bilateral lower extremities.  Lovenox started on 10/1.             Dopplers negative for DVT 3. Pain Management: Tylenol prn 4. Mood: LCSW to follow for evaluation and support.  5. Neuropsych: This patient is not fully capable of making decisions on her own behalf. 6. Skin/Wound Care: Routine pressure relief measures. Maintain adequate nutritional hydration status. Added supplements 3 times daily. 7. Fluids/Electrolytes/Nutrition: Monitor I's and O's.              Advance to D3 thins on 10/8, continue to advance as tolerated 8. Acute on chronic respiratory failure: Question history of OSA.              Decannulated on 10/7 without issue 9. Right pleural effusion: Right Pleurx drain placed 09/17 by radiology. Drain Pleurx daily-monitor for any signs of bleeding. Clamped per IR, reassess  this week  10. Chronic systolic CHF: Monitor weights daily for trends and watch for other signs of overload. On spironolactone, lasix and low dose ASA. No statin due to cirrhosis.  Filed Weights   12/21/17 0411 12/22/17 0500 12/24/17 0617  Weight: 69.6 kg 69.5  kg 73.3 kg            wt up on 10/12 no signs of peripheral edema, no abnormal lung sounds,  11. Chronic A fib: Monitor heart rate twice daily--resumed low dose BB due to tachycardia.   Was on Xarelto at home due to embolic stroke,              Monitor heart rate with increased mobility Vitals:   12/24/17 2051 12/25/17 0501  BP: 105/70 110/67  Pulse: 79 86  Resp: 16 18  Temp: (!) 97.3 F (36.3 C) 97.6 F (36.4 C)  SpO2: 100% 100%   12. CAD s/p CABG/ICM: Will need ICD in the future.  13. H/o R-MCA stroke: Has been off Xarelto since June per husband/records. Continue low dose ASA.              Will consider  restarting pending lab work 14. H/o depression: Stable on paxil. Also on modafinil for activation.  15. GIB with hematochezia due to esophagitis: On PPI bid.            Hemoglobin 7.8 on 10/7, stable 7.8 on 10/10  Hemoccult negative             Cont to monitor 16. Cirrhosis of the liver/Hepatic encephalopathy: Continue Rifaximin (off lactulose)             Ammonia remains elevated at 36 on 10/1, repeat normal 24 on  10/10 17. T2DM: Hgb A1C- 5.5. Her intake variable therefore will monitor BS ac/hs and use SSI for now.              Slightly elevated on 10/10, however relatively controlled otherwise 18. Constipation: No BM since 9/24 per nursing. Increased miralax to bid. May need to resume lactulose. 19. Code status: Husband wants full code now that patient is getting better.  20. Hyponatremia  Sodium 129 on 10/7 and 10/10   Continue to monitor 21. AKI             Creatinine 1.25 on 10/7, 1.20 on 10/10                LOS: 13 days A FACE TO FACE EVALUATION WAS PERFORMED  Erick Colace 12/25/2017, 6:47 AM

## 2017-12-26 ENCOUNTER — Inpatient Hospital Stay (HOSPITAL_COMMUNITY): Payer: Medicare Other | Admitting: Speech Pathology

## 2017-12-26 ENCOUNTER — Inpatient Hospital Stay (HOSPITAL_COMMUNITY): Payer: Medicare Other | Admitting: Physical Therapy

## 2017-12-26 ENCOUNTER — Inpatient Hospital Stay (HOSPITAL_COMMUNITY): Payer: Medicare Other | Admitting: Occupational Therapy

## 2017-12-26 LAB — CBC
HCT: 24.6 % — ABNORMAL LOW (ref 36.0–46.0)
Hemoglobin: 7.4 g/dL — ABNORMAL LOW (ref 12.0–15.0)
MCH: 27.2 pg (ref 26.0–34.0)
MCHC: 30.1 g/dL (ref 30.0–36.0)
MCV: 90.4 fL (ref 80.0–100.0)
NRBC: 0 % (ref 0.0–0.2)
PLATELETS: 291 10*3/uL (ref 150–400)
RBC: 2.72 MIL/uL — ABNORMAL LOW (ref 3.87–5.11)
RDW: 18.6 % — AB (ref 11.5–15.5)
WBC: 7.3 10*3/uL (ref 4.0–10.5)

## 2017-12-26 LAB — BASIC METABOLIC PANEL
Anion gap: 9 (ref 5–15)
Anion gap: 8 (ref 5–15)
BUN: 37 mg/dL — ABNORMAL HIGH (ref 8–23)
BUN: 35 mg/dL — AB (ref 8–23)
CALCIUM: 10.2 mg/dL (ref 8.9–10.3)
CALCIUM: 10.3 mg/dL (ref 8.9–10.3)
CHLORIDE: 95 mmol/L — AB (ref 98–111)
CHLORIDE: 97 mmol/L — AB (ref 98–111)
CO2: 23 mmol/L (ref 22–32)
CO2: 24 mmol/L (ref 22–32)
CREATININE: 1.35 mg/dL — AB (ref 0.44–1.00)
Creatinine, Ser: 1.38 mg/dL — ABNORMAL HIGH (ref 0.44–1.00)
GFR calc non Af Amer: 40 mL/min — ABNORMAL LOW (ref >60)
GFR, EST AFRICAN AMERICAN: 47 mL/min — AB (ref >60)
GFR, EST AFRICAN AMERICAN: 48 mL/min — AB (ref >60)
GFR, EST NON AFRICAN AMERICAN: 41 mL/min — AB (ref >60)
Glucose, Bld: 109 mg/dL — ABNORMAL HIGH (ref 70–99)
Glucose, Bld: 164 mg/dL — ABNORMAL HIGH (ref 70–99)
Potassium: 4.9 mmol/L (ref 3.5–5.1)
Potassium: 4.8 mmol/L (ref 3.5–5.1)
SODIUM: 127 mmol/L — AB (ref 135–145)
SODIUM: 129 mmol/L — AB (ref 135–145)

## 2017-12-26 LAB — GLUCOSE, CAPILLARY
GLUCOSE-CAPILLARY: 130 mg/dL — AB (ref 70–99)
Glucose-Capillary: 101 mg/dL — ABNORMAL HIGH (ref 70–99)
Glucose-Capillary: 153 mg/dL — ABNORMAL HIGH (ref 70–99)
Glucose-Capillary: 141 mg/dL — ABNORMAL HIGH (ref 70–99)

## 2017-12-26 LAB — OCCULT BLOOD X 1 CARD TO LAB, STOOL: Fecal Occult Bld: NEGATIVE

## 2017-12-26 LAB — CORTISOL-AM, BLOOD: CORTISOL - AM: 14.3 ug/dL (ref 6.7–22.6)

## 2017-12-26 NOTE — Progress Notes (Signed)
Occupational Therapy Session Note  Patient Details  Name: Glenda Walker MRN: [id number] Date of Birth: February 10, 1956  Today's Date: 12/26/2017 OT Individual Time: 4742-5956 OT Individual Time Calculation (min): 45 min    Short Term Goals: Week 1:  OT Short Term Goal 1 (Week 1): Pt will complete Ub bathing and dressing with supervision. OT Short Term Goal 1 - Progress (Week 1): Not met OT Short Term Goal 2 (Week 1): Pt will complete LB bathing sit to stand with min assist. OT Short Term Goal 2 - Progress (Week 1): Not met OT Short Term Goal 3 (Week 1): Pt will complete LB dressing with mod assist sit to stand for 2 consecutive sessions. OT Short Term Goal 3 - Progress (Week 1): Met OT Short Term Goal 4 (Week 1): Pt will complete toilet transfers with mod assist stand pivot RW to 3:1 OT Short Term Goal 4 - Progress (Week 1): Met OT Short Term Goal 5 (Week 1): Pt will maintain sustained attention during selfcare tasks for 15 mins with no more than mod instructional cueing for re-direction. OT Short Term Goal 5 - Progress (Week 1): Not met Week 2:  OT Short Term Goal 1 (Week 2): Pt will maintain sustained attention during selfcare tasks for 15 mins with no more than mod instructional cueing for re-direction. OT Short Term Goal 2 (Week 2): Pt will complete UB bathing and dressing with supervision. OT Short Term Goal 3 (Week 2): Pt will complete LB bathing sit to stand with min assist. OT Short Term Goal 4 (Week 2): Pt will complete toilet transfer ambulating to the elevated toilet with rail and RW with supervision.       Skilled Therapeutic Interventions/Progress Updates:    Pt received in w/c with spouse reporting that she had just finished toileting.  He stated that with her RW she completed transfer and toileting with him just by her side. (S) Pt agreeable to doffing clothing to bathe and redress. Overall, pt continues to need min-mod verbal cues to ensure she is bathing all areas  thoroughly and min cues to orient clothing to enable her to bathe and dress with supervision.  Pt completed all sit to stands with S using UE support once standing initially and then she was able to release hands to pull her pants up.  Pt crossed her legs to don socks. Pt's spouse states he is ready to take her home.  Pt tolerated therapy well and sitting in w/c with spouse by her side.  Therapy Documentation Precautions:  Precautions Precautions: Fall Restrictions Weight Bearing Restrictions: No    Vital Signs: Therapy Vitals Temp: 97.8 F (36.6 C) Temp Source: Oral Pulse Rate: 70 Resp: 19 BP: 94/64 Patient Position (if appropriate): Lying Oxygen Therapy SpO2: 100 % O2 Device: Room Air Pain: Pain Assessment Pain Score: 0-No pain ADL: ADL Eating: Set up Where Assessed-Eating: Bed level Grooming: Supervision/safety Where Assessed-Grooming: Sitting at sink Upper Body Bathing: Minimal assistance Where Assessed-Upper Body Bathing: Wheelchair Lower Body Bathing: Supervision/safety, Moderate cueing Where Assessed-Lower Body Bathing: Wheelchair, Sitting at sink Upper Body Dressing: Moderate cueing, Supervision/safety Where Assessed-Upper Body Dressing: Wheelchair Lower Body Dressing: Supervision/safety, Minimal cueing Where Assessed-Lower Body Dressing: Wheelchair Toileting: Supervision/safety Where Assessed-Toileting: Glass blower/designer: Close supervision Toilet Transfer Method: Counselling psychologist: Grab bars   Therapy/Group: Individual Therapy  [place] 12/26/2017, 10:02 AM

## 2017-12-26 NOTE — Progress Notes (Signed)
Occupational Therapy Discharge Summary  Patient Details  Name: Glenda Walker MRN: [id number] Date of Birth: October 15, 1955  Today's Date: 12/26/2017 OT Individual Time: 1407-1500 OT Individual Time Calculation (min): 53 min   Session Note:  Pt worked on [employer] strengthening with use of the yellow therapy putty and max instructional cueing for following handout of exercises given.  Pt's spouse provided with handout as well for reference and can assist her at discharge with exercises.  Had pt also work on locating small beads placed in the putty and working on pulling them out one of a time.  Pt with decreased emergent awareness as when she was working with the putty she let part of it drop down to the floor without any awareness of it.  Completed BUE theraband exercises with use of the light resistance orange therapy band.  Pt completed 1 set of 10 reps for shoulder horizontal abduction, triceps extension, and biceps flexion.  She needed max demonstrational cueing for technique.  Did not issue HEP for therex as pt needs too much cuing and physical assist at this time.  Defer this until [employer] can work with her further.  Had her ambulate from the therapy gym back to the room with close supervision using the RW for support.  Pt left with spouse at end of session with pt requesting to stay up in the wheelchair.   Patient has met 10 of 10 long term goals due to improved activity tolerance, improved balance, postural control, ability to compensate for deficits, improved attention and improved awareness.  Patient to discharge at overall Supervision level.  Patient's care partner is independent to provide the necessary physical and cognitive assistance at discharge.    Reasons goals not met: NA  Recommendation:  Patient will benefit from ongoing skilled OT services in home health setting to continue to advance functional skills in the area of BADL and Reduce care partner burden.  Pt continues to demonstrate  significant cognitive impairments with limitations in sustained attention, safety awareness, memory, and sequencing.  In addition, she also still demonstrates limitations in endurance as well as balance, resulting in the need for 24 hr supervision and use of DME for ADL tasks.  Feel she will need 24 hour supervision and continued HHOT for progression.   Equipment: tub bench  Reasons for discharge: treatment goals met and discharge from hospital  Patient/family agrees with progress made and goals achieved: Yes  OT Discharge Precautions/Restrictions  Precautions Precautions: Fall Restrictions Weight Bearing Restrictions: No Pain Pain Assessment Pain Scale: Faces Pain Score: 0-No pain ADL ADL Eating: Set up Where Assessed-Eating: Bed level Grooming: Supervision/safety Where Assessed-Grooming: Sitting at sink Upper Body Bathing: Minimal assistance Where Assessed-Upper Body Bathing: Wheelchair Lower Body Bathing: Supervision/safety, Moderate cueing Where Assessed-Lower Body Bathing: Wheelchair, Sitting at sink Upper Body Dressing: Moderate cueing, Supervision/safety Where Assessed-Upper Body Dressing: Wheelchair Lower Body Dressing: Supervision/safety, Minimal cueing Where Assessed-Lower Body Dressing: Wheelchair Toileting: Supervision/safety Where Assessed-Toileting: Glass blower/designer: Close supervision Armed forces technical officer Method: Counselling psychologist: Ambulance person Transfer: Close supervison Clinical cytogeneticist Method: Administrator, arts: Facilities manager: Close supervision Social research officer, government Method: Heritage manager: Radio broadcast assistant Vision Baseline Vision/History: Wears glasses Wears Glasses: At all times Patient Visual Report: Blurring of vision Vision Assessment?: Yes Eye Alignment: Within Functional Limits Ocular Range of Motion: Within Functional Limits Alignment/Gaze  Preference: Head tilt(left head tilt) Tracking/Visual Pursuits: Able to track stimulus in all quads without difficulty Convergence: Within  functional limits Visual Fields: No apparent deficits Perception  Perception: Within Functional Limits Praxis Praxis: Intact Cognition Overall Cognitive Status: Impaired/Different from baseline Arousal/Alertness: Awake/alert Orientation Level: Oriented to person;Oriented to place;Oriented to situation Attention: Sustained;Selective Focused Attention: Appears intact Sustained Attention: Impaired Sustained Attention Impairment: Functional basic Selective Attention: Impaired Selective Attention Impairment: Functional basic Memory: Impaired Memory Impairment: Decreased recall of new information;Decreased short term memory Decreased Short Term Memory: Verbal basic;Functional basic Awareness: Impaired Awareness Impairment: Emergent impairment;Intellectual impairment Safety/Judgment: Impaired Comments: Pt still exhibits decreased sustained attention to selfcare tasks, requiring mod instructional cueing overall to sequence through basic ADL tasks and for safe completion of functional transfers.   Sensation Sensation Light Touch: Appears Intact Hot/Cold: Appears Intact Proprioception: Appears Intact Coordination Gross Motor Movements are Fluid and Coordinated: Yes Fine Motor Movements are Fluid and Coordinated: Yes Coordination and Movement Description: Pt still with slower movements in BUEs Motor  Motor Motor: Abnormal postural alignment and control Motor - Discharge Observations: generalized weakness, still with head tilt to the left in sitting and standing as well as cervical flexion Mobility  Transfers Sit to Stand: Supervision/Verbal cueing Stand to Sit: Supervision/Verbal cueing  Trunk/Postural Assessment  Cervical Assessment Cervical Assessment: Exceptions to WFL(forward cervical flexion with head tilt to the right) Thoracic  Assessment Thoracic Assessment: Exceptions to WFL(thoracic kyphosis) Lumbar Assessment Lumbar Assessment: Exceptions to WFL(increased posterior pelvic tilt in unsupported sitting)  Balance Balance Balance Assessed: Yes Static Sitting Balance Static Sitting - Balance Support: Feet supported Static Sitting - Level of Assistance: 6: Modified independent (Device/Increase time) Dynamic Sitting Balance Dynamic Sitting - Balance Support: No upper extremity supported;Feet supported Dynamic Sitting - Level of Assistance: 6: Modified independent (Device/Increase time) Static Standing Balance Static Standing - Level of Assistance: 5: Stand by assistance Dynamic Standing Balance Dynamic Standing - Balance Support: During functional activity Dynamic Standing - Level of Assistance: 5: Stand by assistance Extremity/Trunk Assessment RUE Assessment RUE Assessment: Exceptions to [facility] Active Range of Motion (AROM) Comments: WFLS General Strength Comments: Shoulder strength 3+/5, triceps 4/5, grip strength 4/5, grip strength 3+/5 LUE Assessment LUE Assessment: Within Functional Limits Active Range of Motion (AROM) Comments: WFLs General Strength Comments: shoulder strength 3+/5, triceps 4/5, biceps, 4/5 and grip strength 4/5   Hakim Minniefield OTR/L 12/26/2017, 4:30 PM

## 2017-12-26 NOTE — Progress Notes (Signed)
Physical Therapy Discharge Summary  Patient Details  Name: Glenda Walker MRN: [id number] Date of Birth: Sep 01, 1955  Today's Date: 12/26/2017 PT Individual Time: 1130-1200 PT Individual Time Calculation (min): 30 min    Patient has met 9 of 9 long term goals due to improved activity tolerance, improved balance, improved postural control, increased strength, improved attention and improved awareness.  Patient to discharge at an ambulatory level min guard.   Patient's care partner is independent to provide the necessary physical and cognitive assistance at discharge.  Reasons goals not met: All goals met  Recommendation:  Patient will benefit from ongoing skilled PT services in home health setting to continue to advance safe functional mobility, address ongoing impairments in strength, balance, activity tolerance, and minimize fall risk.  Equipment: w/c  Reasons for discharge: treatment goals met and discharge from hospital  Patient/family agrees with progress made and goals achieved: Yes  PT Discharge Precautions/Restrictions Restrictions Weight Bearing Restrictions: No Vital Signs Therapy Vitals Pulse Rate: 70 BP: 94/64 Pain Pain Assessment Pain Scale: 0-10 Pain Score: 0-No pain  Cognition Overall Cognitive Status: Impaired/Different from baseline Arousal/Alertness: Awake/alert Orientation Level: Oriented to person;Oriented to place;Oriented to situation Attention: Selective;Sustained Sustained Attention: Impaired Sustained Attention Impairment: Verbal complex;Functional complex Selective Attention: Impaired Selective Attention Impairment: Verbal basic;Functional basic Memory: Impaired Memory Impairment: Decreased recall of new information;Decreased short term memory Decreased Short Term Memory: Verbal basic;Functional basic Awareness: Impaired Awareness Impairment: Intellectual impairment;Emergent impairment;Anticipatory impairment Problem Solving:  Impaired Problem Solving Impairment: Verbal complex;Functional complex Executive Function: (all areas impacted by lower level deficits) Safety/Judgment: Impaired Comments: decreased recall of deficits prohibits safety awareness Sensation Sensation Light Touch: Appears Intact Coordination Gross Motor Movements are Fluid and Coordinated: Yes Heel Shin Test: slow speed, decreased excursion Motor  Motor Motor - Discharge Observations: generalized weakness  Mobility Bed Mobility Bed Mobility: Supine to Sit;Sit to Supine Supine to Sit: Supervision/Verbal cueing Sit to Supine: Supervision/Verbal cueing Transfers Transfers: Sit to Stand;Stand Pivot Transfers Sit to Stand: Supervision/Verbal cueing Stand to Sit: Supervision/Verbal cueing Stand Pivot Transfers: Contact Guard/Touching assist Transfer (Assistive device): Rolling walker Locomotion  Gait Ambulation: Yes Gait Assistance: Contact Guard/Touching assist Gait Distance (Feet): 50 Feet Assistive device: Rolling walker Gait Gait: Yes Gait Pattern: Impaired Gait Pattern: Poor foot clearance - right;Poor foot clearance - left;Decreased trunk rotation;Lateral hip instability;Right foot flat;Left foot flat;Decreased stride length Gait velocity: significantly reduced for age/gender norms Stairs / Additional Locomotion Stairs: Yes Stairs Assistance: Contact Guard/Touching assist Stair Management Technique: Step to pattern;Forwards;Two rails Number of Stairs: 6 Height of Stairs: 3 Wheelchair Mobility Wheelchair Mobility: No  Trunk/Postural Assessment  Cervical Assessment Cervical Assessment: Exceptions to WFL(forward head) Thoracic Assessment Thoracic Assessment: Exceptions to WFL(increased kyphosis) Lumbar Assessment Lumbar Assessment: Exceptions to WFL(reduced lumbar lordosis) Postural Control Postural Control: Deficits on evaluation Righting Reactions: delayed  Balance Balance Balance Assessed: Yes Static Sitting  Balance Static Sitting - Balance Support: Feet supported Static Sitting - Level of Assistance: 6: Modified independent (Device/Increase time) Dynamic Sitting Balance Dynamic Sitting - Balance Support: No upper extremity supported;Feet supported Dynamic Sitting - Level of Assistance: 6: Modified independent (Device/Increase time) Static Standing Balance Static Standing - Balance Support: During functional activity;Bilateral upper extremity supported Static Standing - Level of Assistance: 5: Stand by assistance Dynamic Standing Balance Dynamic Standing - Balance Support: During functional activity;Bilateral upper extremity supported Dynamic Standing - Level of Assistance: 5: Stand by assistance Extremity Assessment  RUE Assessment RUE Assessment: Within Functional Limits General Strength Comments: WFL for mobility activities LUE Assessment  LUE Assessment: Within Functional Limits General Strength Comments: WFL for mobility activities RLE Assessment General Strength Comments: grossly 4/5 throughout LLE Assessment General Strength Comments: grossly 4/5  Skilled Therapeutic Intervention: Pt received in w/c, denies pain and agreeable to treatment. Assessed all mobility as above with husband providing min guard for gait and stairs. Reports they plan to use w/c for home entry and avoid pt having to step up/down threshold with RW. Pt noted to have incontinent bladder accident; upon investigation it appeared that pt had incontinence earlier in the morning and brief/pants were changed but pt sat back down on wet cushion. Therapist replaced cushion, husband provided assist for changing shorts. Pt and husband with no further questions or concerns regarding d/c home at this time. Remained in w/c with setup for lunch, husband present and all needs in reach.    Benjiman Core Seigle 12/26/2017, 12:09 PM

## 2017-12-26 NOTE — Progress Notes (Signed)
[place] PHYSICAL MEDICINE & REHABILITATION PROGRESS NOTE  Subjective/Complaints:  Patient seen sitting up in bed this morning.  She states she slept well overnight.  Confirmed with sleep chart.  She still sleepy this morning.  Husband with questions regarding Xarelto and insulin.  ROS:  Denies CP, SOB, nausea, vomiting, diarrhea  Objective: Vital Signs: Blood pressure 118/73, pulse 88, temperature 97.8 F (36.6 C), temperature source Oral, resp. rate 19, weight 72.2 kg, SpO2 100 %. No results found. Recent Labs    12/26/17 0418  WBC 7.3  HGB 7.4*  HCT 24.6*  PLT 291   Recent Labs    12/26/17 0418  NA 127*  K 4.9  CL 95*  CO2 23  GLUCOSE 109*  BUN 37*  CREATININE 1.38*  CALCIUM 10.2    Physical Exam: Today's Vitals   12/25/17 2327 12/26/17 0023 12/26/17 0500 12/26/17 0606  BP:    118/73  Pulse:    88  Resp:    19  Temp:    97.8 F (36.6 C)  TempSrc:    Oral  SpO2:    100%  Weight:   72.2 kg   PainSc: 3  Asleep     Body mass index is 25.69 kg/m.  Constitutional: She appears well-developed. Frail HENT: Normocephalic and atraumatic.  Eyes: EOM are normal.  No discharge.  Neck: Stoma dressed Cardiovascular:  Irregularly irregular.  No JVD. Respiratory: Effort normal.  Clear. Right lower lateral chest with Pleurex catheter in place with dry dressing.  GI: Distended. Bowel sounds are normal, stable.  Musculoskeletal: No edema or tenderness in extremities Neurological: She is alert and oriented Delayed and slow speech,  improved  She was able to follow basic motor commands.  Motor: Grossly 4/5 throughout,  stable Skin: Skin is warm and dry.  Psychiatric: Her affect is blunt. Her speech is delayed. She is slowedand withdrawn, improving overall.  Ext no pitting edema in LE  Assessment/Plan: 1. Functional deficits secondary to debility which require 3+ hours per day of interdisciplinary therapy in a comprehensive inpatient rehab setting.  Physiatrist  is providing close team supervision and 24 hour management of active medical problems listed below.  Physiatrist and rehab team continue to assess barriers to discharge/monitor patient progress toward functional and medical goals  Care Tool:  Bathing    Body parts bathed by patient: Right arm, Left arm, Chest, Abdomen, Front perineal area, Buttocks, Right upper leg, Left upper leg, Right lower leg, Left lower leg, Face   Body parts bathed by helper: Buttocks Body parts n/a: Chest, Abdomen, Front perineal area, Buttocks   Bathing assist Assist Level: Minimal Assistance - Patient > 75%     Upper Body Dressing/Undressing Upper body dressing   What is the patient wearing?: Pull over shirt, Button up shirt    Upper body assist Assist Level: Minimal Assistance - Patient > 75%    Lower Body Dressing/Undressing Lower body dressing      What is the patient wearing?: Pants     Lower body assist Assist for lower body dressing: Minimal Assistance - Patient > 75%     Toileting Toileting Toileting Activity did not occur (Clothing management and hygiene only): N/A (no void or bm)  Toileting assist Assist for toileting: Contact Guard/Touching assist Assistive Device Comment: RW   Transfers Chair/bed transfer  Transfers assist     Chair/bed transfer assist level: Contact Guard/Touching assist     Locomotion Ambulation   Ambulation assist   Ambulation activity did not  occur: Safety/medical concerns  Assist level: Contact Guard/Touching assist Assistive device: Walker-rolling Max distance: 75   Walk 10 feet activity   Assist     Assist level: Contact Guard/Touching assist Assistive device: Walker-rolling   Walk 50 feet activity   Assist Walk 50 feet with 2 turns activity did not occur: Safety/medical concerns  Assist level: Contact Guard/Touching assist Assistive device: Walker-rolling    Walk 150 feet activity   Assist Walk 150 feet activity did not  occur: Safety/medical concerns         Walk 10 feet on uneven surface  activity   Assist Walk 10 feet on uneven surfaces activity did not occur: Safety/medical concerns         Wheelchair     Assist   Type of Wheelchair: Manual    Wheelchair assist level: Moderate Assistance - Patient 50 - 74% Max wheelchair distance: 75    Wheelchair 50 feet with 2 turns activity    Assist    Wheelchair 50 feet with 2 turns activity did not occur: Safety/medical concerns   Assist Level: Moderate Assistance - Patient 50 - 74%   Wheelchair 150 feet activity     Assist Wheelchair 150 feet activity did not occur: Safety/medical concerns          Medical Problem List and Plan: 1. Limitation in self-care, endurance, dysphagia, and mobility secondary to debility.             Continue CIR 2. DVT Prophylaxis/Anticoagulation: Mechanical: Sequential compression devices, below knee Bilateral lower extremities.  Lovenox started on 10/1.             Dopplers negative for DVT 3. Pain Management: Tylenol prn 4. Mood: LCSW to follow for evaluation and support.  5. Neuropsych: This patient is not fully capable of making decisions on her own behalf. 6. Skin/Wound Care: Routine pressure relief measures. Maintain adequate nutritional hydration status. Added supplements 3 times daily. 7. Fluids/Electrolytes/Nutrition: Monitor I's and O's.              Advance to D3 thins on 10/8, continue to advance as tolerated 8. Acute on chronic respiratory failure: Question history of OSA.              Decannulated on 10/7 without issue 9. Right pleural effusion: Right Pleurx drain placed 09/17 by radiology. Drain Pleurx daily-monitor for any signs of bleeding. Clamped per VIR, notes reviewed, reassess this week  10. Chronic systolic CHF: Monitor weights daily for trends and watch for other signs of overload. On spironolactone, lasix and low dose ASA. No statin due to cirrhosis.  Filed  Weights   12/22/17 0500 12/24/17 0617 12/26/17 0500  Weight: 69.5 kg 73.3 kg 72.2 kg    ?Trending up on 10/14, repeat weight ordered 11. Chronic A fib: Monitor heart rate twice daily--resumed low dose BB due to tachycardia.   Was on Xarelto at home due to embolic stroke,              Monitor heart rate with increased mobility Vitals:   12/25/17 1952 12/26/17 0606  BP: 113/74 118/73  Pulse: 84 88  Resp: 19 19  Temp: 98.5 F (36.9 C) 97.8 F (36.6 C)  SpO2: 100% 100%   12. CAD s/p CABG/ICM: Will need ICD in the future.  13. H/o R-MCA stroke: Has been off Xarelto since June per husband/records. Continue low dose ASA.  14. H/o depression: Stable on paxil. Also on modafinil for activation.  15. GIB with  hematochezia due to esophagitis: On PPI bid.            Hemoglobin 7.4 on 10/14  Hemoccult negative             Cont to monitor 16. Cirrhosis of the liver/Hepatic encephalopathy: Continue Rifaximin (off lactulose)             Ammonia remains elevated at 36 on 10/1, repeat normal 24 on  10/10  ?increase in distension, will follow up 17. T2DM: Hgb A1C- 5.5. Her intake variable therefore will monitor BS ac/hs and use SSI for now.              Improving on 10/14 18. Constipation: No BM since 9/24 per nursing. Increased miralax to bid. May need to resume lactulose. 19. Code status: Husband wants full code now that patient is getting better.  20. Hyponatremia  Sodium 127 on 10/14  Labs reordered  Continue to monitor 21. AKI             Creatinine 1.38 on 10/14  LOS: 14 days A FACE TO FACE EVALUATION WAS PERFORMED  Ankit Karis Juba 12/26/2017, 8:26 AM

## 2017-12-27 MED ORDER — SPIRONOLACTONE 25 MG PO TABS: 25.0000 mg | ORAL_TABLET | Freq: Two times a day (BID) | ORAL | 0 refills | Status: AC

## 2017-12-27 MED ORDER — POLYETHYLENE GLYCOL 3350 17 G PO PACK: 17.0000 g | PACK | Freq: Two times a day (BID) | ORAL | 0 refills | Status: AC

## 2017-12-27 MED ORDER — MELATONIN 3 MG PO TABS: 3.0000 mg | ORAL_TABLET | Freq: Every day | ORAL | 0 refills | Status: AC

## 2017-12-27 MED ORDER — METOCLOPRAMIDE HCL 5 MG PO TABS: 5.0000 mg | ORAL_TABLET | Freq: Three times a day (TID) | ORAL | 0 refills | Status: AC

## 2017-12-27 MED ORDER — ACETAMINOPHEN 325 MG PO TABS: 325.0000 mg | ORAL_TABLET | ORAL | Status: AC | PRN

## 2017-12-27 MED ORDER — FUROSEMIDE 20 MG PO TABS: 20.0000 mg | ORAL_TABLET | Freq: Two times a day (BID) | ORAL | 0 refills | Status: AC

## 2017-12-27 MED ORDER — METOPROLOL TARTRATE 25 MG PO TABS: 12.5000 mg | ORAL_TABLET | Freq: Two times a day (BID) | ORAL | 0 refills | Status: AC

## 2017-12-27 MED ORDER — PAROXETINE HCL 40 MG PO TABS: 40.0000 mg | ORAL_TABLET | Freq: Every day | ORAL | 0 refills | Status: AC

## 2017-12-27 MED ORDER — RIFAXIMIN 550 MG PO TABS: 550.0000 mg | ORAL_TABLET | Freq: Two times a day (BID) | ORAL | 0 refills | Status: AC

## 2017-12-27 MED ORDER — PANTOPRAZOLE SODIUM 40 MG PO TBEC: 40.0000 mg | DELAYED_RELEASE_TABLET | Freq: Two times a day (BID) | ORAL | 0 refills | Status: AC

## 2017-12-27 MED ORDER — RIVAROXABAN 15 MG PO TABS: 15.0000 mg | ORAL_TABLET | Freq: Every day | ORAL | 0 refills | Status: AC

## 2017-12-27 MED ORDER — LEVOTHYROXINE SODIUM 150 MCG PO TABS: 150.0000 ug | ORAL_TABLET | Freq: Every day | ORAL | 0 refills | Status: AC

## 2017-12-27 MED ORDER — RIVAROXABAN 15 MG PO TABS: 15.0000 mg | ORAL_TABLET | Freq: Every day | ORAL | Status: DC

## 2017-12-27 NOTE — Progress Notes (Signed)
330 Patient complain of pain (5 out 10) to right side patient's husband drained pleurux with nurse supervision, output 100cc. Patient tolerated procedure.

## 2017-12-27 NOTE — Progress Notes (Signed)
Social Work  Discharge Note  The overall goal for the admission was met for:   Discharge location: Yes - home with spouse who can provide 24/7 assistance  Length of Stay: Yes - 15 days  Discharge activity level: Yes -min assist overall  Home/community participation: Yes  Services provided included: MD, RD, PT, OT, SLP, RN, TR, Pharmacy, Neuropsych and SW  Financial Services: Medicare  Follow-up services arranged: Home Health: RN, PT, OT, ST via [place] [facility], DME: tub bench - spouse to pick up on his own and Patient/Family has no preference for HH/DME agencies  Comments (or additional information):  Patient/Family verbalized understanding of follow-up arrangements: Yes  Individual responsible for coordination of the follow-up plan: spouse  Confirmed correct DME delivered: spouse to pick up tub bench    Kenon Delashmit

## 2017-12-27 NOTE — Discharge Summary (Addendum)
Physician Discharge Summary  Patient ID: Glenda Walker MRN: [id number] DOB/AGE: 1956-01-12 62 y.o.  Admit date: 12/12/2017 Discharge date: 12/27/2017  Discharge Diagnoses:  Principal Problem:   Debility Active Problems:   Pleural effusion, right--s/p Pleurex catheter   Chronic systolic heart failure (HCC)   Coronary artery disease involving coronary bypass graft of native heart without angina pectoris   History of CVA with residual deficit   Cirrhosis of liver without ascites (HCC)   Diabetes mellitus type 2 in nonobese (HCC)   Slow transit constipation   Chronic hyponatremia   AKI (acute kidney injury) ([place])   Acute blood loss anemia   Discharged Condition: stable.   Significant Diagnostic Studies: Ct Abdomen Wo Contrast  Result Date: 12/01/2017 CLINICAL DATA:  Preop planning for gastrostomy tube EXAM: CT ABDOMEN WITHOUT CONTRAST TECHNIQUE: Multidetector CT imaging of the abdomen was performed following the standard protocol without IV contrast. COMPARISON:  11/10/2017 FINDINGS: Lower chest: Near complete resolution of the right pleural effusion. Persistent small/moderate left pleural effusion with consolidation/atelectasis in the visualized left lower lung. Previous median sternotomy. Four-chamber cardiac enlargement. Hepatobiliary: Peripherally calcified stone in the lumen of the distended gallbladder as before. Liver unremarkable. No definite biliary ductal dilatation identified. Pancreas: Unremarkable. No pancreatic ductal dilatation or surrounding inflammatory changes. Spleen: Normal in size without focal abnormality. Adrenals/Urinary Tract: Adrenal glands are unremarkable. Kidneys are normal, without renal calculi, focal lesion, or hydronephrosis. Stomach/Bowel: Nasogastric tube decompresses the stomach. Visualized portions of small bowel and colon are nondistended. The transverse colon is interposed between the anterior abdominal wall and liver. Limited percutaneous window  evident for gastrostomy placement, however a safe percutaneous approach had been evident on the prior study. Vascular/Lymphatic: Moderate aortoiliac atheromatous plaque, ectatic distal aorta measured at least 2.8 cm diameter. No definite abdominal adenopathy. Other: Mild ascites.  No free air. Musculoskeletal: Body wall anasarca. Previous median sternotomy. Negative for fracture or worrisome bone lesion. IMPRESSION: 1. Abdominal ascites, a relative contraindication to percutaneous gastrostomy placement. 2. Colonic interposition between the decompressed stomach in the anterior abdominal wall. If percutaneous gastrostomy is attempted, recommend colonic opacification to avoid transgression. 3. Interval resolution of right pleural effusion. 4. Cholelithiasis 5. Ectatic abdominal aorta at risk for aneurysm development. Recommend followup by ultrasound in 5 years. This recommendation follows ACR consensus guidelines: White Paper of the ACR Incidental Findings Committee II on Vascular Findings. J Am Coll Radiol 2013; 10:789-794. Electronically Signed   By: Lucrezia Europe M.D.   On: 12/01/2017 14:35   Dg Chest 2 View  Result Date: 12/22/2017 CLINICAL DATA:  Somnolence. EXAM: CHEST - 2 VIEW COMPARISON:  12/10/2017 and prior studies FINDINGS: Cardiomegaly, CABG changes and atrial clip again noted. RIGHT pleural catheter and LEFT PICC line with tip overlying the LEFT axillary region again noted Pulmonary vascular congestion is present. Improved LEFT basilar aeration with trace pleural effusions noted. No pneumothorax. IMPRESSION: Improved LEFT basilar aeration without other significant change. Cardiomegaly, pulmonary vascular congestion and trace pleural effusions. Electronically Signed   By: Margarette Canada M.D.   On: 12/22/2017 11:54   Dg Chest Port 1 View  Result Date: 12/10/2017 CLINICAL DATA:  Pleural effusions EXAM: PORTABLE CHEST 1 VIEW COMPARISON:  12/02/2017 FINDINGS: Tracheostomy tube stable. Right PleurX stable. NG  tube removed. Atrial appendage stable unchanged. Cardiomegaly stable. Vascular congestion without Kerley B lines to suggest edema stable. IMPRESSION: Stable vascular congestion. Electronically Signed   By: Marybelle Killings M.D.   On: 12/10/2017 09:44   Dg Chest [facility]  Result Date: 12/02/2017 CLINICAL DATA:  Respiratory failure EXAM: PORTABLE CHEST 1 VIEW COMPARISON:  11/30/2017 FINDINGS: Tracheostomy tube and nasogastric catheter are again noted and stable. Postsurgical changes are again seen. Right PleurX catheter is noted in satisfactory position. Stable left retrocardiac atelectasis is noted. Improvement in right-sided pleural effusion is noted when compare with the prior exam. Some minimal right basilar atelectasis remains. IMPRESSION: Bibasilar atelectasis left greater than right stable from the previous exam. Decreased pleural fluid on the right. Electronically Signed   By: Inez Catalina M.D.   On: 12/02/2017 08:18   Dg Chest Port 1 View  Result Date: 11/30/2017 CLINICAL DATA:  Follow-up pleural effusion EXAM: PORTABLE CHEST 1 VIEW COMPARISON:  11/28/2017 FINDINGS: Cardiac shadow remains enlarged. Postsurgical changes are noted. Tracheostomy tube and nasogastric catheter are noted in satisfactory position. New right PleurX catheter is noted in satisfactory position. Interval reduction in right-sided pleural effusion is seen. No pneumothorax is noted. Bibasilar atelectatic changes are seen. IMPRESSION: Reduction in right-sided pleural effusion following PleurX catheter placement. Bibasilar atelectatic changes are seen. Electronically Signed   By: Inez Catalina M.D.   On: 11/30/2017 10:41   Dg Swallowing Func-speech Pathology  Result Date: 12/27/2017 Please refer to "Notes" tab for Speech Pathology notes.   Labs:  Basic Metabolic Panel: BMP Latest Ref Rng & Units 12/26/2017 12/26/2017 12/22/2017  Glucose 70 - 99 mg/dL 164(H) 109(H) 163(H)  BUN 8 - 23 mg/dL 35(H) 37(H) 29(H)  Creatinine 0.44  - 1.00 mg/dL 1.35(H) 1.38(H) 1.20(H)  Sodium 135 - 145 mmol/L 129(L) 127(L) 129(L)  Potassium 3.5 - 5.1 mmol/L 4.8 4.9 4.7  Chloride 98 - 111 mmol/L 97(L) 95(L) 95(L)  CO2 22 - 32 mmol/L '24 23 24  ' Calcium 8.9 - 10.3 mg/dL 10.3 10.2 10.6(H)    CBC: CBC Latest Ref Rng & Units 12/26/2017 12/22/2017 12/19/2017  WBC 4.0 - 10.5 K/uL 7.3 7.1 6.9  Hemoglobin 12.0 - 15.0 g/dL 7.4(L) 7.8(L) 7.8(L)  Hematocrit 36.0 - 46.0 % 24.6(L) 26.3(L) 25.7(L)  Platelets 150 - 400 K/uL 291 345 288    Hepatic Function Latest Ref Rng & Units 12/13/2017 12/05/2017 12/03/2017  Total Protein 6.5 - 8.1 g/dL 7.4 - -  Albumin 3.5 - 5.0 g/dL 2.8(L) 2.3(L) 2.2(L)  AST 15 - 41 U/L 23 - -  ALT 0 - 44 U/L 18 - -  Alk Phosphatase 38 - 126 U/L 180(H) - -  Total Bilirubin 0.3 - 1.2 mg/dL 0.6 - -    CBG: Recent Labs  Lab 12/25/17 2138 12/26/17 0624 12/26/17 1135 12/26/17 1709 12/26/17 2142  GLUCAP 118* 101* 130* 153* 141*    Brief HPI:   Glenda Walker is a 62 year old female with history of CAD status post CABG, right-MCA stroke due to A. fib/atrial thrombus 07/2016, T2DM, cirrhosis of the liver; who was treated for acute cholecystitis with moderate right pleural effusion with antibiotics and discharged to home in June.  She was readmitted to [facility] from OSH on 10/10/2017 with AMS, hypotension and respiratory failure requiring intubation.  Hospital course significant for shock with severe heart failure--EF < 25%, PNA, ARF, ileus with colonic pseudoobstruction requiring colonic decompression, hepatic encephalopathy with ammonia levels > 200, GI bleed as well as difficulty with vent wean requiring tracheostomy.  EP recommended ICD "once stable and rehabilitated".  She required TPN with trickle tube feeds, was extubated to ATC and transferred to [facility] on 10/31/2017 for vent wean.  Moderate ascites noted on CT of abdomen and patient was not  felt to be a candidate for PEG placement.   She had difficulty with vent wean requiring  right thoracocentesis on 8/30 and 9/09.  Ultimately right tunneled pleurx catheter was placed by Dr. Carman Ching on  09/17. She was able to tolerate weaning to ATC and was downsized to CFS #6 by 9/26.  She was tolerating plugging during the day as well as advancement of diet to dysphagia 2, thin liquids.  Intake was noted to be variable.  She was showing improvement in activity tolerance and was making progress with therapy.  She was felt to be a good candidate for intensive rehab program and cleared medically on 9/30.  Vitals:  Blood pressure 135/87, pulse 88, temperature 98.6 F (37 C), temperature source Oral, resp. rate 16, weight 70.9 kg, SpO2 100 %. Physical Exam  Constitutional: She is oriented to person, place, and time. She appears well-developed and well-nourished. No distress.  HENT:  Head: Normocephalic and atraumatic.  Mouth/Throat: Oropharynx is clear and moist.  Eyes: Pupils are equal, round, and reactive to light. Conjunctivae are normal.  Neck: Normal range of motion. Neck supple.  Stoma with small opening which is clean and dry.   Cardiovascular: Normal rate. An irregular rhythm present.  Pulmonary/Chest: Effort normal and breath sounds normal. No respiratory distress.  Dry dressing right lateral chest wall at pleurex site.   Abdominal: Soft. Bowel sounds are normal. She exhibits distension. There is no tenderness. There is no guarding.  Musculoskeletal: She exhibits no edema or tenderness.  Neurological: She is alert and oriented to person, place, and time.  Speech clear and more spontaneous. Delayed processing but speed has improved. BUE/BLE strength 4/5.   Skin: Skin is warm and dry. She is not diaphoretic.  Psychiatric: Her affect is blunt. Her speech is delayed. She is slowed. Cognition and memory are impaired. She expresses inappropriate judgment.  Nursing note and vitals reviewed.   Hospital Course: ADAYAH AROCHO was admitted to rehab 12/12/2017 for inpatient  therapies to consist of PT, ST and OT at least three hours five days a week. Past admission physiatrist, therapy team and rehab RN have worked together to provide customized collaborative inpatient rehab.  She tolerated plugging around-the-clock and was decannulated on 10/07 without issues.  Pleurx catheter has been drained daily and follow-up chest x-ray showed improvement in left basilar aeration with cardiomegaly and trace pleural effusions.  ABG on room air showed no signs of hypoxia.  Ammonia levels at admission were elevated at 46.  She was maintained on rifaximin and repeat levels of 10/10 was within normal limits at 21.  Chronic hyponatremia has been stable with Na at 129.  BLE dopplers were negative for DVT and she was maintained on SQ Lovenox for DVT prophylaxis.   Serial CBC showed that anemia of chronic disease is stable overall and no signs of bleeding noted.  Low-dose aspirin was resumed due to cardiac history and stool guaiacs x4 have been negative.  Per discussion with husband,  Xarelto was resumed at discharge and patient will need need CBC drawn in 1 week to monitor for stability.  She has tolerated increase in activity without any cardiac symptoms.  Heart rate has been monitored on twice daily basis and is stable on low-dose beta-blocker.  She was monitored for signs of overload and weights have been monitored daily.  She continues on furosemide and Spironolactone twice daily.  Routine check of renal status showed rise in serum creatinine to 1.35--question acute on chronic renal failure versus  baseline. .   Weight at discharge is up at 156 Lbs.   Husband has been advised to monitor weights daily and watch for signs of overload.  Drainage from Pleurx catheter is currently down to 100 cc daily.  Discussions were held with radiology, patient and husband about removing the catheter v/s draining catheter less frequently and monitoring for symptoms.  Patient preferred to go home with catheter in  place. Husband was advised to drain catheter every other day to once a week and document output.  Radiology will contact patient in a couple weeks for input regarding decision on catheter removal. Her diet was advanced to dysphagia 3 and po intake is slowly improving. Nutritional supplements were offered on twice daily basis to help with low calorie malnutrition. Blood sugars have been controlled with use of SSI insulin and family instructed on adherence to carb modified diet after discharge  She has made steady progress  but continues to exhibit significant cognitive deficits impacting attention, safety awareness and memory. She has progressed to min assist level and will continue to receive follow up [place], [place], [place] and HHST by [place] after discharge.    Rehab course: During patient's stay in rehab weekly team conferences were held to monitor patient's progress, set goals and discuss barriers to discharge. At admission, patient required max assist with mobility and with ADLs.  She demonstrated delayed processing with tendency to fall asleep and required min assist for basic tasks.  She  has had improvement in activity tolerance, balance, postural control as well as ability to compensate for deficits.  She requires cues with supervision for transfers and is able to ambulate 8' with RW and CGA  She is tolerating regular diet without signs or symptoms of aspiration. She requires min assist with basic tasks due to moderate cognitive deficits. Husband has been educated on all aspects of care, safety, mobility, importance of creating a routine as well as use of compensatory strategies to help with memory.    Disposition: Home.   Diet: Heart healthy. Limit sweets and starches.   Special Instructions: 1. Weight daily and contact MD if weight goes up by 2 lbs overnight or 5 lbs over 2 days, difficulty breathing or lying flat.  2. Drain Pleurex catheter every other day or prn discomfort. Try to  space it out to twice a week. 3. Will need CBC/BMET repeated in one week.  4. Increase fluid intake.    Discharge Instructions    Ambulatory referral to Physical Medicine Rehab   Complete by:  As directed    1-2 weeks transitional care appt      Allergies as of 12/27/2017      Reactions   Hydrocodone Nausea And Vomiting      Medication List    STOP taking these medications   ALPRAZolam 0.5 MG tablet Commonly known as:  XANAX   antiseptic oral rinse Liqd   dextrose 5 % and 0.45% NaCl infusion   dextrose 50 % solution   feeding supplement (PRO-STAT 64) Liqd   heparin 5000 UNIT/ML injection   insulin lispro 100 UNIT/ML injection Commonly known as:  HUMALOG   magnesium oxide 400 (241.3 Mg) MG tablet Commonly known as:  MAG-OX   magnesium sulfate 1-5 GM/100ML-% INFUSION   METOPROLOL TARTARATE 1 MG/ML SYRINGE (5ML) Replaced by:  metoprolol tartrate 25 MG tablet   modafinil 100 MG tablet Commonly known as:  PROVIGIL   morphine 2 MG/ML injection   ondansetron 4 MG/2ML Soln injection  Commonly known as:  ZOFRAN   pantoprazole sodium 40 mg/20 mL Pack Commonly known as:  PROTONIX Replaced by:  pantoprazole 40 MG tablet   potassium chloride 10 MEQ/100ML   potassium chloride SA 20 MEQ tablet Commonly known as:  K-DUR,KLOR-CON   POTASSIUM EFFERVESCENT PO   simethicone 80 MG chewable tablet Commonly known as:  MYLICON   sodium polystyrene 15 GM/60ML suspension Commonly known as:  KAYEXALATE   THE VERY FINEST FISH OIL Liqd     TAKE these medications   acetaminophen 325 MG tablet Commonly known as:  TYLENOL Take 1-2 tablets (325-650 mg total) by mouth every 4 (four) hours as needed for mild pain. What changed:    how much to take  how to take this  when to take this  reasons to take this   aspirin EC 81 MG tablet 81 mg daily.   furosemide 20 MG tablet Commonly known as:  LASIX Take 1 tablet (20 mg total) by mouth 2 (two) times daily. What  changed:    how much to take  how to take this   levothyroxine 150 MCG tablet Commonly known as:  SYNTHROID, LEVOTHROID Take 1 tablet (150 mcg total) by mouth daily at 6 (six) AM. What changed:  how to take this   Melatonin 3 MG Tabs Take 1 tablet (3 mg total) by mouth at bedtime. What changed:  how to take this   metoCLOPramide 5 MG tablet Commonly known as:  REGLAN Take 1 tablet (5 mg total) by mouth 3 (three) times daily before meals. What changed:    how to take this  when to take this   metoprolol tartrate 25 MG tablet Commonly known as:  LOPRESSOR Take 0.5 tablets (12.5 mg total) by mouth 2 (two) times daily. Replaces:  METOPROLOL TARTARATE 1 MG/ML SYRINGE (5ML)   pantoprazole 40 MG tablet Commonly known as:  PROTONIX Take 1 tablet (40 mg total) by mouth 2 (two) times daily. Replaces:  pantoprazole sodium 40 mg/20 mL Pack   PARoxetine 40 MG tablet Commonly known as:  PAXIL Take 1 tablet (40 mg total) by mouth daily. What changed:    medication strength  how much to take  how to take this   polyethylene glycol packet Commonly known as:  MIRALAX / GLYCOLAX Take 17 g by mouth 2 (two) times daily. What changed:    how to take this  when to take this   rifaximin 550 MG Tabs tablet Commonly known as:  XIFAXAN Take 1 tablet (550 mg total) by mouth 2 (two) times daily.   Rivaroxaban 15 MG Tabs tablet Commonly known as:  XARELTO Take 1 tablet (15 mg total) by mouth daily with supper.   spironolactone 25 MG tablet Commonly known as:  ALDACTONE Take 1 tablet (25 mg total) by mouth 2 (two) times daily. What changed:  how to take this      Follow-up Information    Jamse Arn, MD Follow up.   Specialty:  Physical Medicine and Rehabilitation Contact information: [address] [place] [address] [phone]        Dr. Daralene Milch Follow up on 01/02/2018.   Why:  @ 1:00 pm (hospital follow up appointment) Contact  information: [place], [place] [address]  [phone]       Cherlynn Polo I, MD Follow up on 12/27/2017.   Specialty:  Cardiology Contact information: [place] [address] [phone]  Nevin Bloodgood Carter,PA Follow up on 01/03/2018.   Why:  Appointment at 10:30 am for post hospital follow up.  Contact information: [facility] [address] [place], [place]       Markus Daft, MD Follow up.   Specialties:  Interventional Radiology, Radiology Contact information: [address] [place] [address] [phone]           Patient seen and examined by me on day of discharge.  Signed: Bary Leriche 12/29/2017, 11:16 AM

## 2017-12-27 NOTE — Plan of Care (Signed)
  Problem: Consults Goal: RH GENERAL PATIENT EDUCATION Description See Patient Education module for education specifics. Outcome: Completed/Met Goal: Skin Care Protocol Initiated - if Braden Score 18 or less Description If consults are not indicated, leave blank or document N/A Outcome: Completed/Met Goal: Nutrition Consult-if indicated Outcome: Completed/Met Goal: Diabetes Guidelines if Diabetic/Glucose > 140 Description If diabetic or lab glucose is > 140 mg/dl - Initiate Diabetes/Hyperglycemia Guidelines & Document Interventions  Outcome: Completed/Met   Problem: RH BOWEL ELIMINATION Goal: RH STG MANAGE BOWEL WITH ASSISTANCE Description STG Manage Bowel with Assistance. Mod  Outcome: Completed/Met Goal: RH STG MANAGE BOWEL W/MEDICATION W/ASSISTANCE Description STG Manage Bowel with Medication with Assistance. Mod  Outcome: Completed/Met   Problem: RH BLADDER ELIMINATION Goal: RH STG MANAGE BLADDER WITH ASSISTANCE Description STG Manage Bladder With Assistance. Mod  Outcome: Completed/Met   Problem: RH SKIN INTEGRITY Goal: RH STG SKIN FREE OF INFECTION/BREAKDOWN Description Free of breakdown and infection while on rehab with mod assist  Outcome: Completed/Met Goal: RH STG MAINTAIN SKIN INTEGRITY WITH ASSISTANCE Description STG Maintain Skin Integrity With Assistance. mod  Outcome: Completed/Met Goal: RH STG ABLE TO PERFORM INCISION/WOUND CARE W/ASSISTANCE Description STG Able To Perform Incision/Wound Care With Assistance. mod  Outcome: Completed/Met   Problem: RH SAFETY Goal: RH STG ADHERE TO SAFETY PRECAUTIONS W/ASSISTANCE/DEVICE Description STG Adhere to Safety Precautions With Assistance/Device. mod  Outcome: Completed/Met Goal: RH STG DECREASED RISK OF FALL WITH ASSISTANCE Description STG Decreased Risk of Fall With Assistance. mod  Outcome: Completed/Met   Problem: RH PAIN MANAGEMENT Goal: RH STG PAIN MANAGED AT OR BELOW PT'S PAIN  GOAL Description Less than 3  Outcome: Completed/Met   Problem: RH KNOWLEDGE DEFICIT GENERAL Goal: RH STG INCREASE KNOWLEDGE OF SELF CARE AFTER HOSPITALIZATION Description Patient or family will be able to verbalize management of condition with cues/handouts  Outcome: Completed/Met   Problem: RH Vision Goal: RH LTG Vision (Specify) Outcome: Completed/Met   Problem: RH Pre-functional (Specify) Goal: RH LTG Pre-functional (Specify) Outcome: Completed/Met    Pt to DC home this shift. Awaiting for IV team to DC midline. Will f/up when DC

## 2017-12-27 NOTE — Progress Notes (Signed)
Spoke with [facility] LPN and made aware that the patient has a midline and she can remove the midline.

## 2017-12-27 NOTE — Progress Notes (Signed)
Pt midline removed to LUA per order. Plerux cath in place. Husband packed all belongings. Pt and husband taken to main lobby for DC.

## 2017-12-27 NOTE — Progress Notes (Signed)
Please see discharge summary.  We will see patient for transitional care management in 1 to 2 weeks.

## 2017-12-27 NOTE — Progress Notes (Signed)
Patient PleurX catheter remains in place at this time.   PA contacted about continuing discussion re: possible removal. Family would like to leave in place at this time.  Patient is discharging home today.  Discussed with RN; home health supplies to be arranged by rehab services at this time.  Patient has a recurrent effusion which husband has been managing daily by draining small amounts.  Recommend spacing out drainage from every day to a few times per week or when symptomatic.   Loyce Dys, MS RD PA-C 10:08 AM

## 2017-12-27 NOTE — Discharge Instructions (Signed)
Inpatient Rehab Discharge Instructions  Glenda Walker Discharge date and time: 12/27/17   Activities/Precautions/ Functional Status: Activity: no lifting, driving, or strenuous exercise till cleared by MD Diet: cardiac diet Limit sweets and carbs.  Wound Care: Keep area clean and dry    Functional status:  ___ No restrictions     ___ Walk up steps independently _X__ 24/7 supervision/assistance   ___ Walk up steps with assistance ___ Intermittent supervision/assistance  ___ Bathe/dress independently ___ Walk with walker     _X__ Bathe/dress with assistance ___ Walk Independently    ___ Shower independently ___ Walk with assistance    ___ Shower with assistance _X__ No alcohol     ___ Return to work/school ________    COMMUNITY REFERRALS UPON DISCHARGE:    Home Health:   PT     OT     ST    RN                      Agency:  [facility]   Phone: [phone]     Special Instructions: 1. Drain Pleurex catheter every other day--or more frequently if you develop shortness of breath. Keep area clean and dry. Contact radiology in case of problems/issues. Document output.  2. Weight daily. Contact MD if weight goes up by 2 lbs overnight or 5 lbs over 2 days, you develop shortness of breath or have difficulty lying sleeping in bed. .  3. Monitor for any signs of bleeding.   4. NO showers till Pleurex come out.    Rivaroxaban oral tablets What is this medicine? RIVAROXABAN (ri va ROX a ban) is an anticoagulant (blood thinner). It is used to treat blood clots in the lungs or in the veins. It is also used after knee or hip surgeries to prevent blood clots. It is also used to lower the chance of stroke in people with a medical condition called atrial fibrillation. This medicine may be used for other purposes; ask your health care provider or pharmacist if you have questions. COMMON BRAND NAME(S): Xarelto, Xarelto Starter Pack What should I tell my health care provider  before I take this medicine? They need to know if you have any of these conditions: -bleeding disorders -bleeding in the brain -blood in your stools (black or tarry stools) or if you have blood in your vomit -history of stomach bleeding -kidney disease -liver disease -low blood counts, like low white cell, platelet, or red cell counts -recent or planned spinal or epidural procedure -take medicines that treat or prevent blood clots -an unusual or allergic reaction to rivaroxaban, other medicines, foods, dyes, or preservatives -pregnant or trying to get pregnant -breast-feeding How should I use this medicine? Take this medicine by mouth with a glass of water. Follow the directions on the prescription label. Take your medicine at regular intervals. Do not take it more often than directed. Do not stop taking except on your doctor's advice. Stopping this medicine may increase your risk of a blood clot. Be sure to refill your prescription before you run out of medicine. If you are taking this medicine after hip or knee replacement surgery, take it with or without food. If you are taking this medicine for atrial fibrillation, take it with your evening meal. If you are taking this medicine to treat blood clots, take it with food at the same time each day. If you are unable to swallow your tablet, you may crush the tablet and  mix it in applesauce. Then, immediately eat the applesauce. You should eat more food right after you eat the applesauce containing the crushed tablet. Talk to your pediatrician regarding the use of this medicine in children. Special care may be needed. Overdosage: If you think you have taken too much of this medicine contact a poison control center or emergency room at once. NOTE: This medicine is only for you. Do not share this medicine with others. What if I miss a dose? If you take your medicine once a day and miss a dose, take the missed dose as soon as you remember. If you take  your medicine twice a day and miss a dose, take the missed dose immediately. In this instance, 2 tablets may be taken at the same time. The next day you should take 1 tablet twice a day as directed. What may interact with this medicine? Do not take this medicine with any of the following medications: -defibrotide This medicine may also interact with the following medications: -aspirin and aspirin-like medicines -certain antibiotics like erythromycin, azithromycin, and clarithromycin -certain medicines for fungal infections like ketoconazole and itraconazole -certain medicines for irregular heart beat like amiodarone, quinidine, dronedarone -certain medicines for seizures like carbamazepine, phenytoin -certain medicines that treat or prevent blood clots like warfarin, enoxaparin, and dalteparin -conivaptan -diltiazem -felodipine -indinavir -lopinavir; ritonavir -NSAIDS, medicines for pain and inflammation, like ibuprofen or naproxen -ranolazine -rifampin -ritonavir -SNRIs, medicines for depression, like desvenlafaxine, duloxetine, levomilnacipran, venlafaxine -SSRIs, medicines for depression, like citalopram, escitalopram, fluoxetine, fluvoxamine, paroxetine, sertraline -St. John's wort -verapamil This list may not describe all possible interactions. Give your health care provider a list of all the medicines, herbs, non-prescription drugs, or dietary supplements you use. Also tell them if you smoke, drink alcohol, or use illegal drugs. Some items may interact with your medicine. What should I watch for while using this medicine? Visit your doctor or health care professional for regular checks on your progress. Notify your doctor or health care professional and seek emergency treatment if you develop breathing problems; changes in vision; chest pain; severe, sudden headache; pain, swelling, warmth in the leg; trouble speaking; sudden numbness or weakness of the face, arm or leg. These can be  signs that your condition has gotten worse. If you are going to have surgery or other procedure, tell your doctor that you are taking this medicine. What side effects may I notice from receiving this medicine? Side effects that you should report to your doctor or health care professional as soon as possible: -allergic reactions like skin rash, itching or hives, swelling of the face, lips, or tongue -back pain -redness, blistering, peeling or loosening of the skin, including inside the mouth -signs and symptoms of bleeding such as bloody or black, tarry stools; red or dark-brown urine; spitting up blood or brown material that looks like coffee grounds; red spots on the skin; unusual bruising or bleeding from the eye, gums, or nose Side effects that usually do not require medical attention (report to your doctor or health care professional if they continue or are bothersome): -dizziness -muscle pain This list may not describe all possible side effects. Call your doctor for medical advice about side effects. You may report side effects to FDA at 1-800-FDA-1088. Where should I keep my medicine? Keep out of the reach of children. Store at room temperature between 15 and 30 degrees C (59 and 86 degrees F). Throw away any unused medicine after the expiration date. NOTE: This sheet is a  summary. It may not cover all possible information. If you have questions about this medicine, talk to your doctor, pharmacist, or health care provider.  2018 Elsevier/Gold Standard (2015-11-19 16:29:33)  My questions have been answered and I understand these instructions. I will adhere to these goals and the provided educational materials after my discharge from the hospital.  Patient/Caregiver Signature _______________________________ Date __________  Clinician Signature _______________________________________ Date __________  Please bring this form and your medication list with you to all your follow-up doctor's  appointments.

## 2017-12-29 ENCOUNTER — Telehealth: Payer: Self-pay | Admitting: *Deleted

## 2017-12-29 NOTE — Telephone Encounter (Signed)
Transitional Care call-I spoke with Mr Sensabaugh    1. Are you/is patient experiencing any problems since coming home? Are there any questions regarding any aspect of care?NO 2. Are there any questions regarding medications administration/dosing? Are meds being taken as prescribed? Patient should review meds with caller to confirm They have all medications.  The Rifaximin is terribly expensive (nearly $1000) and they got 4 days from pharmacy. The son is working on getting something discount-wise from the drug company. I encouraged them to call Amada Jupiter back if they continue to have difficulties. 3. Have there been any falls? NO 4. Has Home Health been to the house and/or have they contacted you? If not, have you tried to contact them? Can we help you contact them? YES 5. Are bowels and bladder emptying properly? Are there any unexpected incontinence issues? If applicable, is patient following bowel/bladder programs? NO 6. Any fevers, problems with breathing, unexpected pain? NO, she did have some pain prior to draining her pleural tube but it is better now. Mr Grandpre is draining qod. 7. Are there any skin problems or new areas of breakdown? NO 8. Has the patient/family member arranged specialty MD follow up (ie cardiology/neurology/renal/surgical/etc)?  Can we help arrange? YES 9. Does the patient need any other services or support that we can help arrange? NO 10. Are caregivers following through as expected in assisting the patient? YES 11. Has the patient quit smoking, drinking alcohol, or using drugs as recommended? N/A--they are weighing daily as directed  Appointment Thursday 01/05/18 @2 :00 arrive by 1:40 to see Dr Allena Katz Instructed to watch for packet in mailbox and address reviewed. [id number]

## 2018-01-05 ENCOUNTER — Encounter: Payer: Self-pay | Admitting: Physical Medicine & Rehabilitation

## 2018-01-05 ENCOUNTER — Encounter: Payer: Medicare Other | Attending: Physical Medicine & Rehabilitation | Admitting: Physical Medicine & Rehabilitation

## 2018-01-05 ENCOUNTER — Encounter: Payer: Medicare Other | Admitting: Physical Medicine & Rehabilitation

## 2018-01-05 VITALS — BP 94/63 | HR 81 | Resp 14

## 2018-01-05 DIAGNOSIS — R1310 Dysphagia, unspecified: Secondary | ICD-10-CM | POA: Insufficient documentation

## 2018-01-05 DIAGNOSIS — Z930 Tracheostomy status: Secondary | ICD-10-CM | POA: Diagnosis not present

## 2018-01-05 DIAGNOSIS — R269 Unspecified abnormalities of gait and mobility: Secondary | ICD-10-CM | POA: Insufficient documentation

## 2018-01-05 DIAGNOSIS — Z8673 Personal history of transient ischemic attack (TIA), and cerebral infarction without residual deficits: Secondary | ICD-10-CM | POA: Diagnosis not present

## 2018-01-05 DIAGNOSIS — I482 Chronic atrial fibrillation, unspecified: Secondary | ICD-10-CM | POA: Insufficient documentation

## 2018-01-05 DIAGNOSIS — I480 Paroxysmal atrial fibrillation: Secondary | ICD-10-CM

## 2018-01-05 DIAGNOSIS — I2510 Atherosclerotic heart disease of native coronary artery without angina pectoris: Secondary | ICD-10-CM | POA: Insufficient documentation

## 2018-01-05 DIAGNOSIS — Z951 Presence of aortocoronary bypass graft: Secondary | ICD-10-CM | POA: Insufficient documentation

## 2018-01-05 DIAGNOSIS — Z87891 Personal history of nicotine dependence: Secondary | ICD-10-CM | POA: Diagnosis not present

## 2018-01-05 DIAGNOSIS — I5023 Acute on chronic systolic (congestive) heart failure: Secondary | ICD-10-CM | POA: Diagnosis not present

## 2018-01-05 DIAGNOSIS — D62 Acute posthemorrhagic anemia: Secondary | ICD-10-CM | POA: Insufficient documentation

## 2018-01-05 DIAGNOSIS — J90 Pleural effusion, not elsewhere classified: Secondary | ICD-10-CM | POA: Diagnosis not present

## 2018-01-05 DIAGNOSIS — R5381 Other malaise: Secondary | ICD-10-CM | POA: Diagnosis not present

## 2018-01-05 DIAGNOSIS — Z79899 Other long term (current) drug therapy: Secondary | ICD-10-CM | POA: Diagnosis not present

## 2018-01-05 DIAGNOSIS — E119 Type 2 diabetes mellitus without complications: Secondary | ICD-10-CM | POA: Insufficient documentation

## 2018-01-05 DIAGNOSIS — E871 Hypo-osmolality and hyponatremia: Secondary | ICD-10-CM

## 2018-01-05 DIAGNOSIS — Z9889 Other specified postprocedural states: Secondary | ICD-10-CM | POA: Insufficient documentation

## 2018-01-05 DIAGNOSIS — I110 Hypertensive heart disease with heart failure: Secondary | ICD-10-CM | POA: Insufficient documentation

## 2018-01-05 DIAGNOSIS — Z952 Presence of prosthetic heart valve: Secondary | ICD-10-CM | POA: Insufficient documentation

## 2018-01-05 DIAGNOSIS — I5022 Chronic systolic (congestive) heart failure: Secondary | ICD-10-CM

## 2018-01-05 DIAGNOSIS — K7290 Hepatic failure, unspecified without coma: Secondary | ICD-10-CM | POA: Diagnosis not present

## 2018-01-05 DIAGNOSIS — K7460 Unspecified cirrhosis of liver: Secondary | ICD-10-CM | POA: Insufficient documentation

## 2018-01-05 DIAGNOSIS — J9611 Chronic respiratory failure with hypoxia: Secondary | ICD-10-CM | POA: Insufficient documentation

## 2018-01-05 DIAGNOSIS — I4891 Unspecified atrial fibrillation: Secondary | ICD-10-CM | POA: Insufficient documentation

## 2018-01-05 DIAGNOSIS — N179 Acute kidney failure, unspecified: Secondary | ICD-10-CM | POA: Insufficient documentation

## 2018-01-05 NOTE — Patient Instructions (Signed)
Please follow up with VIR and Pulm regarding pleurex catheter  Please have PCP CBC and BMP  Please follow up with Gastroenterologyist regarding Xifaxin

## 2018-01-05 NOTE — Progress Notes (Signed)
Subjective:    Patient ID: Glenda Walker, female    DOB: [birth year], 62 y.o.   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  Admit date: 12/12/2017 Discharge date: 12/27/2017  Husband provides history. At discharge, she was instructed to check daily, weights, which have been stable. Her drainage has been decreasing in her Pleurex. She saw PCP, but no labs were drawn. She saw Cards. She does not have an appointment with VIR.  She does not have an appointment with Pulm or GI.  Denies swallowing difficulties.  Therapies: 3/week DME: Independently purchased Mobility: Walker at home and wheelchair in community.  Pain Inventory Average Pain 0 Pain Right Now 0 My pain is no pain  In the last 24 hours, has pain interfered with the following? General activity 0 Relation with others 0 Enjoyment of life 0 What TIME of day is your pain at its worst? no pain Sleep (in general) Good  Pain is worse with: no pain Pain improves with: no pain Relief from Meds: no pain  Mobility walk with assistance use a walker ability to climb steps?  no do you drive?  no use a wheelchair needs help with transfers  Function disabled: date disabled .  Neuro/Psych weakness trouble walking  Prior Studies transitional care  Physicians involved in your care transitional care   Family History  Problem Relation Age of Onset  . Stroke Maternal Grandmother    Social History   Socioeconomic History  . Marital status: Married    Spouse name: Not on file  . Number of children: Not on file  . Years of education: Not on file  . Highest education level: Not on file  Occupational History  . Not on file  Social  Needs  . Financial resource strain: Not on file  . Food insecurity:    Worry: Not on file    Inability: Not on file  . Transportation needs:    Medical: Not on file    Non-medical: Not on file  Tobacco Use  . Smoking status: Former Games developer  . Smokeless tobacco: Never Used  Substance and Sexual Activity  . Alcohol use: Not Currently  . Drug use: Not Currently  . Sexual activity: Not Currently  Lifestyle  . Physical activity:    Days per week: Not on file    Minutes per session: Not on file  . Stress: Not on file  Relationships  . Social connections:    Talks on phone: Not on file    Gets together: Not on file    Attends religious service: Not on file    Active member of club or organization: Not on file    Attends meetings of clubs or organizations: Not on file    Relationship status: Not on file  Other Topics Concern  . Not on file  Social History Narrative  . Not on file   Past Surgical History:  Procedure Laterality Date  . AORTIC VALVE REPLACEMENT (AVR)/CORONARY ARTERY BYPASS GRAFTING (CABG)    . CORONARY ARTERY BYPASS GRAFT    . IR PERC TUN PERIT CATH WO PORT S&I Judi Cong  11/29/2017  . TRACHEOSTOMY     Past Medical History:  Diagnosis Date  . Acute metabolic encephalopathy 11/01/2017  .  Acute on chronic respiratory failure with hypoxia (HCC) 11/01/2017  . Acute on chronic systolic CHF (congestive heart failure) (HCC) 11/01/2017  . Chronic atrial fibrillation   . Chronic respiratory failure with hypoxia (HCC) 11/01/2017  . Cirrhosis of liver with ascites (HCC)   . Coronary artery disease   . Diabetes type 2, uncontrolled (HCC)   . Hyperlipidemia   . Hypertension   . Hypothyroidism   . Pleural effusion, right   . Stroke (cerebrum) (HCC)   . 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   HENT: Negative.   Eyes: Negative.   Respiratory: Negative.   Cardiovascular: Negative.   Gastrointestinal: Negative.   Endocrine: Negative.   Genitourinary: Negative.   Musculoskeletal: Positive for gait problem.  Skin: Negative.   Allergic/Immunologic: Negative.   Neurological: Positive for weakness.  Hematological: Negative.   Psychiatric/Behavioral: Negative.   All other systems reviewed and are negative.      Objective:   Physical Exam Constitutional: She appears well-developed. Frail  HENT: Normocephalic and atraumatic.  Eyes: EOM are normal.  No discharge.  Neck: Stoma dressed Cardiovascular: RRR. No JVD. Respiratory: Effort normal.  Clear. Right lower lateral chest with Pleurex catheter in place with dry dressing.   GI:  Distended. Bowel sounds are normal, stable.  Musculoskeletal: No edema or tenderness in extremities  Neurological: She is alert and oriented Delayed and slow speech,  improved  She was able to follow basic motor commands.  Motor: RUE: 4+/5 proximal to distal LUE: 4/5 proximal to distal B/l LE: 4+/5 proximal to distal Skin: Skin is warm and dry.  Psychiatric: Her affect is blunt. Her speech is delayed. 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  1. Limitation in self-care, endurance, dysphagia, and mobility secondary to debility.  Cont therapies   2. Pain  Controlled  3. Dysphagia  On regular diet without difficulty  4.  Right pleural effusion:   Right Pleurx drain placed 09/17 by radiology, cont  pleurex  May be able to d/c soon  Needs follow up with VIR and Pulm  5.  Chronic systolic CHF:   Cont to monitor weights   Cont meds  6. A fib:   Cont meds  7. CAD s/p CABG/ICM:   Follow up with [facility] in 2 weeks for ICD  8. Acute blood loss anemia  Encouraged repeat labs  9. Cirrhosis of the liver/Hepatic encephalopathy:   Cont meds  Follow up with GI with further recs  10. Hyponatremia  Sodium 127 on 10/14  Recommended follw up labs by PCP  11. AKI  Follow up labs with PCP  12. Gait instability  Cont therapies  Cont walker/wheelchair  Meds reviewed Referrals reviewed All questions answered

## 2018-01-10 ENCOUNTER — Telehealth: Payer: Self-pay

## 2018-01-10 NOTE — Telephone Encounter (Signed)
Pt husband called wanting to know how long patient needed to be on the antibiotic Xifaxan because she is out, it had no refills. Husband states she is having labs this week. I informed him that the labs should show if the infection has cleared up and it will determine if she needs more antibiotics.

## 2018-01-12 ENCOUNTER — Telehealth: Payer: Self-pay

## 2018-01-12 NOTE — Telephone Encounter (Signed)
Pt husband called stating that Dr. Allena Katz put in a referral for Gastroenterology and Pulmonology and they have not heard anything. I reviewed patients chart and referrals was sent on 01/06/18. I called pt husband to inform him and give him the information to the Gastroenterologist and Pulmonologist.

## 2018-01-26 ENCOUNTER — Ambulatory Visit (INDEPENDENT_AMBULATORY_CARE_PROVIDER_SITE_OTHER)
Admission: RE | Admit: 2018-01-26 | Discharge: 2018-01-26 | Disposition: A | Discharge status: Home / Self Care | Payer: Medicare Other | Source: Ambulatory Visit | Attending: Emergency Medicine | Admitting: Emergency Medicine

## 2018-01-26 ENCOUNTER — Ambulatory Visit (INDEPENDENT_AMBULATORY_CARE_PROVIDER_SITE_OTHER): Payer: Medicare Other | Admitting: Emergency Medicine

## 2018-01-26 ENCOUNTER — Encounter: Payer: Self-pay | Admitting: Emergency Medicine

## 2018-01-26 VITALS — BP 104/60 | HR 85 | Ht 65.75 in | Wt 146.0 lb

## 2018-01-26 DIAGNOSIS — J90 Pleural effusion, not elsewhere classified: Secondary | ICD-10-CM

## 2018-01-26 DIAGNOSIS — J9611 Chronic respiratory failure with hypoxia: Secondary | ICD-10-CM

## 2018-01-26 NOTE — Patient Instructions (Addendum)
CXR today Agree that your Pleurx catheter can probably be removed at this point.  We will send a referral to Interventional Radiology in [place] to arrange removal of the Pleurx Follow with your local physicians in [place] and with Dr Allena KatzPatel locally as planned.  Call to see Dr Delton CoombesByrum if any problems or changes with your breathing.

## 2018-01-26 NOTE — Progress Notes (Signed)
Subjective:    Patient ID: Glenda Walker, female    DOB: 20-Nov-1955, 62 y.o.   MRN: [id number]  HPI 62 year old woman who had a long complicated hospitalization from July - September of this year.  She has a history of diabetes, hypothyroidism, hyperlipidemia, hypertension, coronary disease with CABG with chronic systolic heart failure, atrial fibrillation with atrial thrombus on anti-coag.  She originally presented with shock with associated encephalopathy, presumed cardiogenic but ultimate etiology may be multifactorial. This followed a prior hospitalization for acute cholecystitis with sepsis. She was deemed to be a poor cholecystectomy candidate due to her cardiac status, was treated medically.  She required pressor support, mechanical ventilation from which she was initially unable to wean.  She was transferred From [facility] to [facility] in [place], underwent tracheostomy.  She was noted to have a persistent right-sided pleural effusion, related mainly to her CHF, but also persistent due to a cholecystitis from May 2019. She transitioned to CIR, was ultimately decanulated before d/c to home.   She is now back at home in [place], is doing therapy at home. She was able to be decanulated. She still has a tunneled Pleurx in place on the R (placed 11/29/17).  Her husband has been draining it twice a week and the volume has steadily decreased, now less than 15-20cc. Last night he didn't get anything. She has pain when attempted drainage performed.    Review of Systems  Constitutional: Negative for fever and unexpected weight change.  HENT: Negative for congestion, dental problem, ear pain, nosebleeds, postnasal drip, rhinorrhea, sinus pressure, sneezing, sore throat and trouble swallowing.   Eyes: Negative for redness and itching.  Respiratory: Negative for cough, chest tightness, shortness of breath and wheezing.   Cardiovascular: Positive for palpitations. Negative for leg  swelling.  Gastrointestinal: Negative for nausea and vomiting.  Genitourinary: Negative for dysuria.  Musculoskeletal: Negative for joint swelling.  Skin: Negative for rash.  Neurological: Negative for headaches.  Hematological: Does not bruise/bleed easily.  Psychiatric/Behavioral: Negative for dysphoric mood. The patient is not nervous/anxious.     Past Medical History:  Diagnosis Date  . Acute metabolic encephalopathy 11/01/2017  . Acute on chronic respiratory failure with hypoxia (HCC) 11/01/2017  . Acute on chronic systolic CHF (congestive heart failure) (HCC) 11/01/2017  . Chronic atrial fibrillation   . Chronic respiratory failure with hypoxia (HCC) 11/01/2017  . Cirrhosis of liver with ascites (HCC)   . Coronary artery disease   . Diabetes type 2, uncontrolled (HCC)   . Hyperlipidemia   . Hypertension   . Hypothyroidism   . Pleural effusion, right   . Stroke (cerebrum) (HCC)   . Tracheostomy status (HCC) 11/01/2017     Family History  Problem Relation Age of Onset  . Stroke Maternal Grandmother      Social History   Socioeconomic History  . Marital status: Married    Spouse name: Not on file  . Number of children: Not on file  . Years of education: Not on file  . Highest education level: Not on file  Occupational History  . Not on file  Social Needs  . Financial resource strain: Not on file  . Food insecurity:    Worry: Not on file    Inability: Not on file  . Transportation needs:    Medical: Not on file    Non-medical: Not on file  Tobacco Use  . Smoking status: Former Smoker    Packs/day: 0.25  Years: 20.00    Pack years: 5.00    Types: Cigarettes    Last attempt to quit: 06/13/2017    Years since quitting: 0.6  . Smokeless tobacco: Never Used  Substance and Sexual Activity  . Alcohol use: Not Currently  . Drug use: Not Currently  . Sexual activity: Not Currently  Lifestyle  . Physical activity:    Days per week: Not on file    Minutes per  session: Not on file  . Stress: Not on file  Relationships  . Social connections:    Talks on phone: Not on file    Gets together: Not on file    Attends religious service: Not on file    Active member of club or organization: Not on file    Attends meetings of clubs or organizations: Not on file    Relationship status: Not on file  . Intimate partner violence:    Fear of current or ex partner: Not on file    Emotionally abused: Not on file    Physically abused: Not on file    Forced sexual activity: Not on file  Other Topics Concern  . Not on file  Social History Narrative  . Not on file     Allergies  Allergen Reactions  . Hydrocodone Nausea And Vomiting     Outpatient Medications Prior to Visit  Medication Sig Dispense Refill  . acetaminophen (TYLENOL) 325 MG tablet Take 1-2 tablets (325-650 mg total) by mouth every 4 (four) hours as needed for mild pain.    Marland Kitchen. aspirin EC 81 MG tablet 81 mg daily.     . furosemide (LASIX) 20 MG tablet Take 1 tablet (20 mg total) by mouth 2 (two) times daily. 60 tablet 0  . levothyroxine (SYNTHROID, LEVOTHROID) 150 MCG tablet Take 1 tablet (150 mcg total) by mouth daily at 6 (six) AM. 30 tablet 0  . Melatonin 3 MG TABS Take 1 tablet (3 mg total) by mouth at bedtime. 30 tablet 0  . metoCLOPramide (REGLAN) 5 MG tablet Take 1 tablet (5 mg total) by mouth 3 (three) times daily before meals. 90 tablet 0  . metoprolol tartrate (LOPRESSOR) 25 MG tablet Take 0.5 tablets (12.5 mg total) by mouth 2 (two) times daily. 30 tablet 0  . pantoprazole (PROTONIX) 40 MG tablet Take 1 tablet (40 mg total) by mouth 2 (two) times daily. 60 tablet 0  . PARoxetine (PAXIL) 40 MG tablet Take 1 tablet (40 mg total) by mouth daily. 30 tablet 0  . polyethylene glycol (MIRALAX / GLYCOLAX) packet Take 17 g by mouth 2 (two) times daily. 60 each 0  . rifaximin (XIFAXAN) 550 MG TABS tablet Take 1 tablet (550 mg total) by mouth 2 (two) times daily. 60 tablet 0  . Rivaroxaban  (XARELTO) 15 MG TABS tablet Take 1 tablet (15 mg total) by mouth daily with supper. 30 tablet 0  . spironolactone (ALDACTONE) 25 MG tablet Take 1 tablet (25 mg total) by mouth 2 (two) times daily. 60 tablet 0   No facility-administered medications prior to visit.         Objective:   Physical Exam  Vitals:   01/26/18 1140  BP: 104/60  Pulse: 85  SpO2: 99%  Weight: 146 lb (66.2 kg)  Height: 5' 5.75" (1.67 m)   Gen: Pleasant, well-nourished, in no distress,  normal affect  ENT: No lesions,  mouth clear,  oropharynx clear, no postnasal drip, healing stoma with small pinhole   Neck:  No JVD, no stridor  Lungs: No use of accessory muscles, distant but clear.   Cardiovascular: RRR, heart sounds normal, no murmur or gallops, trace peripheral edema   Musculoskeletal: No soreness around the Pleurx  Neuro: alert, non focal  Skin: Warm, no erythema around the Pleurx     Assessment & Plan:  Pleural effusion, right--s/p Pleurex catheter At this point given the minimal output I suspect that the Pleurx can be removed.  I will check a chest x-ray to look for any gross evidence for loculated fluid.  She would like to get the tube out here in [place] and I will refer her to our interventional radiologists.  She has follow-up with Dr. Allena Katz with PMR on 02/16/2018, we may be able to synchronize the visits.  Chronic respiratory failure with hypoxia (HCC) Multifactorial but principally due to CHF and cardiogenic shock with bilateral infiltrates, encephalopathy.  May also have had some contribution from her right pleural effusion which was drained.  She is been decannulated successfully and is comfortable on room air.  Her stoma is almost fully healed.  Given her history of tobacco use she may need pulmonary follow-up locally, could benefit from bronchodilator therapy.  Levy Pupa, MD, PhD 01/26/2018, 12:39 PM [facility] [phone] or if no answer [phone]

## 2018-01-26 NOTE — Assessment & Plan Note (Signed)
At this point given the minimal output I suspect that the Pleurx can be removed.  I will check a chest x-ray to look for any gross evidence for loculated fluid.  She would like to get the tube out here in [place] and I will refer her to our interventional radiologists.  She has follow-up with Dr. Allena KatzPatel with [facility] on 02/16/2018, we may be able to synchronize the visits.

## 2018-01-26 NOTE — Assessment & Plan Note (Addendum)
Multifactorial but principally due to CHF and cardiogenic shock with bilateral infiltrates, encephalopathy.  May also have had some contribution from her right pleural effusion which was drained.  She is been decannulated successfully and is comfortable on room air.  Her stoma is almost fully healed.  Given her history of tobacco use she may need pulmonary follow-up locally, could benefit from bronchodilator therapy.

## 2018-01-31 ENCOUNTER — Ambulatory Visit (HOSPITAL_COMMUNITY)
Admission: RE | Admit: 2018-01-31 | Discharge: 2018-01-31 | Disposition: A | Discharge status: Home / Self Care | Payer: Medicare Other | Source: Ambulatory Visit | Attending: Emergency Medicine | Admitting: Emergency Medicine

## 2018-01-31 ENCOUNTER — Encounter (HOSPITAL_COMMUNITY): Payer: Self-pay | Admitting: Radiology

## 2018-01-31 DIAGNOSIS — J90 Pleural effusion, not elsewhere classified: Secondary | ICD-10-CM | POA: Insufficient documentation

## 2018-01-31 DIAGNOSIS — Z4682 Encounter for fitting and adjustment of non-vascular catheter: Secondary | ICD-10-CM | POA: Insufficient documentation

## 2018-01-31 HISTORY — PX: IR REMOVAL OF PLURAL CATH W/CUFF: IMG5346

## 2018-01-31 MED ORDER — LIDOCAINE HCL 1 % IJ SOLN
INTRAMUSCULAR | Status: DC | PRN
  Administered 2018-01-31: 5 mL

## 2018-01-31 MED ORDER — LIDOCAINE HCL 1 % IJ SOLN
INTRAMUSCULAR | Status: AC
  Filled 2018-01-31: qty 20

## 2018-02-16 ENCOUNTER — Other Ambulatory Visit: Payer: Self-pay

## 2018-02-16 ENCOUNTER — Encounter: Payer: Medicare Other | Attending: Physical Medicine & Rehabilitation | Admitting: Physical Medicine & Rehabilitation

## 2018-02-16 ENCOUNTER — Encounter: Payer: Self-pay | Admitting: Physical Medicine & Rehabilitation

## 2018-02-16 VITALS — BP 108/71 | HR 89 | Ht 65.75 in | Wt 155.0 lb

## 2018-02-16 DIAGNOSIS — E119 Type 2 diabetes mellitus without complications: Secondary | ICD-10-CM | POA: Insufficient documentation

## 2018-02-16 DIAGNOSIS — I2510 Atherosclerotic heart disease of native coronary artery without angina pectoris: Secondary | ICD-10-CM | POA: Insufficient documentation

## 2018-02-16 DIAGNOSIS — Z87891 Personal history of nicotine dependence: Secondary | ICD-10-CM | POA: Insufficient documentation

## 2018-02-16 DIAGNOSIS — R1310 Dysphagia, unspecified: Secondary | ICD-10-CM | POA: Insufficient documentation

## 2018-02-16 DIAGNOSIS — D62 Acute posthemorrhagic anemia: Secondary | ICD-10-CM | POA: Insufficient documentation

## 2018-02-16 DIAGNOSIS — Z951 Presence of aortocoronary bypass graft: Secondary | ICD-10-CM | POA: Diagnosis not present

## 2018-02-16 DIAGNOSIS — I25810 Atherosclerosis of coronary artery bypass graft(s) without angina pectoris: Secondary | ICD-10-CM

## 2018-02-16 DIAGNOSIS — Z8673 Personal history of transient ischemic attack (TIA), and cerebral infarction without residual deficits: Secondary | ICD-10-CM | POA: Insufficient documentation

## 2018-02-16 DIAGNOSIS — N179 Acute kidney failure, unspecified: Secondary | ICD-10-CM | POA: Diagnosis not present

## 2018-02-16 DIAGNOSIS — Z79899 Other long term (current) drug therapy: Secondary | ICD-10-CM | POA: Insufficient documentation

## 2018-02-16 DIAGNOSIS — Z952 Presence of prosthetic heart valve: Secondary | ICD-10-CM | POA: Insufficient documentation

## 2018-02-16 DIAGNOSIS — Z930 Tracheostomy status: Secondary | ICD-10-CM | POA: Diagnosis not present

## 2018-02-16 DIAGNOSIS — K7290 Hepatic failure, unspecified without coma: Secondary | ICD-10-CM | POA: Diagnosis not present

## 2018-02-16 DIAGNOSIS — J9611 Chronic respiratory failure with hypoxia: Secondary | ICD-10-CM | POA: Diagnosis not present

## 2018-02-16 DIAGNOSIS — J90 Pleural effusion, not elsewhere classified: Secondary | ICD-10-CM | POA: Insufficient documentation

## 2018-02-16 DIAGNOSIS — K7460 Unspecified cirrhosis of liver: Secondary | ICD-10-CM | POA: Diagnosis not present

## 2018-02-16 DIAGNOSIS — R5381 Other malaise: Secondary | ICD-10-CM | POA: Diagnosis not present

## 2018-02-16 DIAGNOSIS — E871 Hypo-osmolality and hyponatremia: Secondary | ICD-10-CM | POA: Insufficient documentation

## 2018-02-16 DIAGNOSIS — I480 Paroxysmal atrial fibrillation: Secondary | ICD-10-CM

## 2018-02-16 DIAGNOSIS — R269 Unspecified abnormalities of gait and mobility: Secondary | ICD-10-CM | POA: Insufficient documentation

## 2018-02-16 DIAGNOSIS — I4891 Unspecified atrial fibrillation: Secondary | ICD-10-CM | POA: Diagnosis not present

## 2018-02-16 DIAGNOSIS — I5023 Acute on chronic systolic (congestive) heart failure: Secondary | ICD-10-CM | POA: Insufficient documentation

## 2018-02-16 DIAGNOSIS — I482 Chronic atrial fibrillation, unspecified: Secondary | ICD-10-CM | POA: Insufficient documentation

## 2018-02-16 DIAGNOSIS — I110 Hypertensive heart disease with heart failure: Secondary | ICD-10-CM | POA: Insufficient documentation

## 2018-02-16 DIAGNOSIS — I5022 Chronic systolic (congestive) heart failure: Secondary | ICD-10-CM

## 2018-02-16 DIAGNOSIS — Z9889 Other specified postprocedural states: Secondary | ICD-10-CM | POA: Diagnosis not present

## 2018-02-16 NOTE — Addendum Note (Signed)
Addended by: Maryla MorrowPATEL, ANKIT A on: 02/16/2018 03:48 PM   Modules accepted: Level of Service

## 2018-02-16 NOTE — Progress Notes (Signed)
Subjective:    Patient ID: Glenda BunnellPatricia A Wichert, female    DOB: 03/22/1955, 62 y.o.   MRN: [id number]  HPI 62 year old female with history of CAD status post CABG, right-MCA stroke due to A. fib/atrial thrombus 07/2016, T2DM, cirrhosis of the liver; who was treated for acute cholecystitis with moderate right pleural effusion with antibiotics presents for follow up for debility.  Last clinic visit 01/05/18.  Husband provides history. Since that time, he notes stable weights.  Pleurex was d/ced, no drainage. She saw Cards with plans for pacemake in Jan.  She no longer requires assistive device for ambulation. Denies falls. She continues to be in therapies. She saw GI with adjustments to meds. Labs improving per pt, per GI. Denies falls.   Pain Inventory Average Pain 0 Pain Right Now 0 My pain is no pain  In the last 24 hours, has pain interfered with the following? General activity 0 Relation with others 0 Enjoyment of life 0 What TIME of day is your pain at its worst? no pain Sleep (in general) Good  Pain is worse with: no pain Pain improves with: no pain Relief from Meds: no pain  Mobility walk with assistance use a walker ability to climb steps?  no do you drive?  no use a wheelchair needs help with transfers  Function disabled: date disabled .  Neuro/Psych weakness trouble walking  Prior Studies transitional care  Physicians involved in your care transitional care   Family History  Problem Relation Age of Onset  . Stroke Maternal Grandmother    Social History   Socioeconomic History  . Marital status: Married    Spouse name: Not on file  . Number of children: Not on file  . Years of education: Not on file  . Highest education level: Not on file  Occupational History  . Not on file  Social Needs  . Financial resource strain: Not on file  . Food insecurity:    Worry: Not on file    Inability: Not on file  . Transportation needs:    Medical: Not on file      Non-medical: Not on file  Tobacco Use  . Smoking status: Former Smoker    Packs/day: 0.25    Years: 20.00    Pack years: 5.00    Types: Cigarettes    Last attempt to quit: 06/13/2017    Years since quitting: 0.6  . Smokeless tobacco: Never Used  Substance and Sexual Activity  . Alcohol use: Not Currently  . Drug use: Not Currently  . Sexual activity: Not Currently  Lifestyle  . Physical activity:    Days per week: Not on file    Minutes per session: Not on file  . Stress: Not on file  Relationships  . Social connections:    Talks on phone: Not on file    Gets together: Not on file    Attends religious service: Not on file    Active member of club or organization: Not on file    Attends meetings of clubs or organizations: Not on file    Relationship status: Not on file  Other Topics Concern  . Not on file  Social History Narrative  . Not on file   Past Surgical History:  Procedure Laterality Date  . AORTIC VALVE REPLACEMENT (AVR)/CORONARY ARTERY BYPASS GRAFTING (CABG)    . CORONARY ARTERY BYPASS GRAFT    . IR PERC TUN PERIT CATH WO PORT S&I Judi Cong/IMAG  11/29/2017  .  IR REMOVAL OF PLURAL CATH W/CUFF  01/31/2018  . TRACHEOSTOMY     Past Medical History:  Diagnosis Date  . Acute metabolic encephalopathy 11/01/2017  . Acute on chronic respiratory failure with hypoxia (HCC) 11/01/2017  . Acute on chronic systolic CHF (congestive heart failure) (HCC) 11/01/2017  . Chronic atrial fibrillation   . Chronic respiratory failure with hypoxia (HCC) 11/01/2017  . Cirrhosis of liver with ascites (HCC)   . Coronary artery disease   . Diabetes type 2, uncontrolled (HCC)   . Hyperlipidemia   . Hypertension   . Hypothyroidism   . Pleural effusion, right   . Stroke (cerebrum) (HCC)   . 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   HENT: Negative.   Eyes: Negative.   Respiratory: Negative.   Cardiovascular: Negative.   Gastrointestinal: Negative.   Endocrine: Negative.   Genitourinary: Negative.   Musculoskeletal: Positive for gait problem.  Skin: Negative.   Allergic/Immunologic: Negative.   Neurological: Positive for weakness.  Hematological: Negative.   Psychiatric/Behavioral: Negative.   All other systems reviewed and are negative.      Objective:   Physical Exam Constitutional: She appears well-developed. Frail  HENT: Normocephalic and atraumatic.  Eyes: EOM are normal.  No discharge.  Neck: Stoma dressed Cardiovascular: RRR. No JVD. Respiratory: Effort normal. Clear GI:  Distended. Bowel sounds are normal, stable.  Musculoskeletal: No edema or tenderness in extremities  Neurological: She is alert and oriented Delayed and slow speech,  improving She was able to follow basic motor commands.  Motor: RUE: 4-4+/5 proximal to distal LUE: 4-4+/5 proximal to distal B/l LE: 4+/5 proximal to distal Skin: Skin is warm and dry.  Psychiatric: Her affect is blunt. Her speech is delayed. She is slowed and withdrawn, improving    Assessment & Plan:  62 year old female with history of CAD status post CABG, right-MCA stroke due to A. fib/atrial thrombus 07/2016, T2DM, cirrhosis of the liver; who was treated for acute cholecystitis with moderate right pleural effusion with antibiotics presents for follow up for debility.  1. Limitation in self-care, endurance, dysphagia, and mobility secondary to debility.  Cont therapies  2.  Right pleural effusion:   Pleurx d/ced   3.  Chronic systolic CHF:   Cont to monitor weights   Cont meds  4. A fib:   Cont meds  5. CAD s/p CABG/ICM:   Cont follow up with  [facility], plan for pacemaker in Jan  6. Cirrhosis of the liver/Hepatic encephalopathy:   Cont meds  Cont follow up with GI  7. AKI  Improving per GI, per pt  8. Hyponatremia  Na+ 129 on 10/14  Cont to monitor

## 2018-04-19 ENCOUNTER — Encounter: Payer: Medicare Other | Admitting: Physical Medicine & Rehabilitation

## 2020-07-19 IMAGING — DX DG ABD PORTABLE 1V
1 series · 1 of 1 positions shown · non-contrast
Comparison: Single-view of the abdomen 10/31/2016.

CLINICAL DATA: NG tube placement.

EXAM:
PORTABLE ABDOMEN - 1 VIEW

[abdomen]
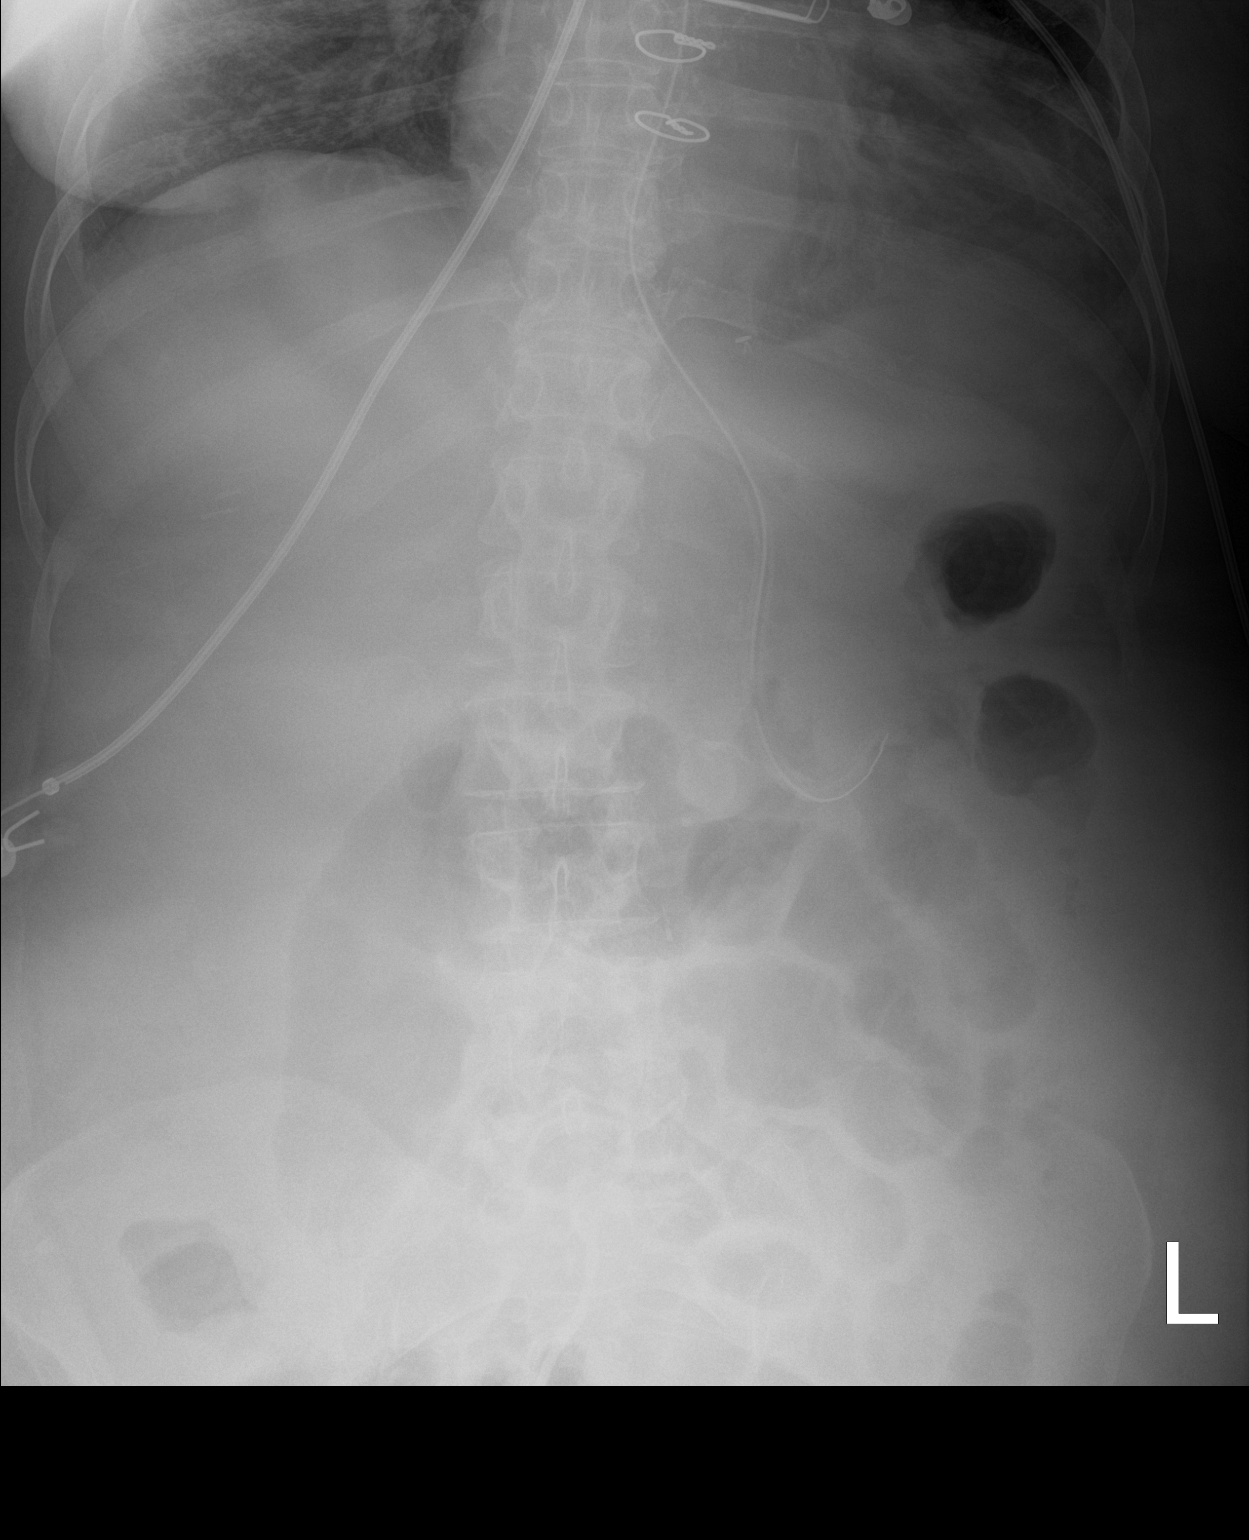

[1 of 1 positions shown; findings below may reference images not displayed]

FINDINGS: NG tube tip and side port project in the stomach.
IMPRESSION: As above.

## 2020-07-23 IMAGING — DX DG ABD PORTABLE 1V
1 series · 1 of 1 positions shown · non-contrast
Comparison: One-view abdomen 11/01/2017

CLINICAL DATA: Ileus.

EXAM:
PORTABLE ABDOMEN - 1 VIEW

[abdomen kub]
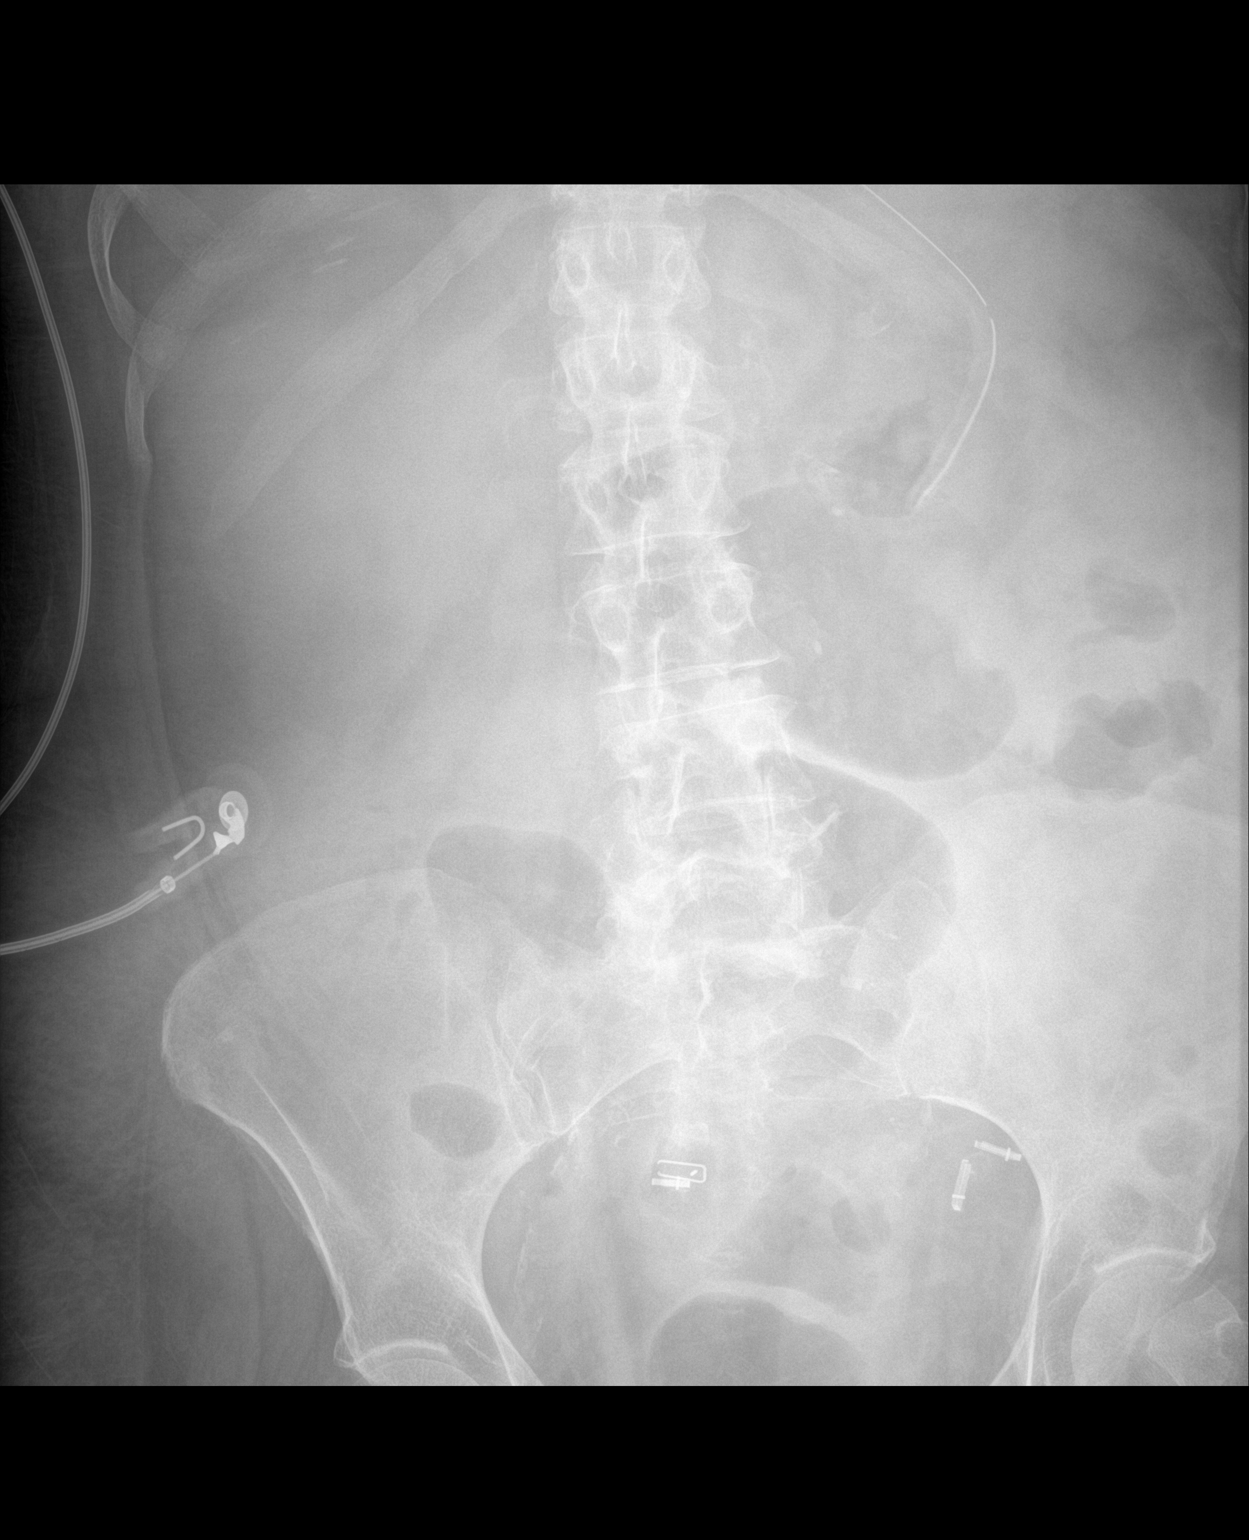

[1 of 1 positions shown; findings below may reference images not displayed]

FINDINGS: Previously noted loops of small bowel are improved. The side port of
the NGT is in the fundus of the stomach. Degenerative changes are
noted in the lumbar spine. Clips are present along the fallopian
tubes.
IMPRESSION: 1. Improved bowel gas pattern significant distended bowel.
2. NG tube remains in the stomach.

## 2020-07-28 IMAGING — CT CT ABDOMEN W/O CM
2 of 4 series · 15 of 46 positions shown, 17 images · non-contrast
Comparison: No prior abdominal CT

CLINICAL DATA: 61-year-old female with a history of acute
respiratory illness

EXAM:
CT ABDOMEN WITHOUT CONTRAST
TECHNIQUE: Multidetector CT imaging of the abdomen was performed following the
standard protocol without IV contrast.

[Series 3: a/p w/o 5mm · axial · non-contrast · 0.88mm/px · z∈[+1198,+1488]mm · 12 of 64 slices shown, 14 images]
[im 3/64  soft-tissue]
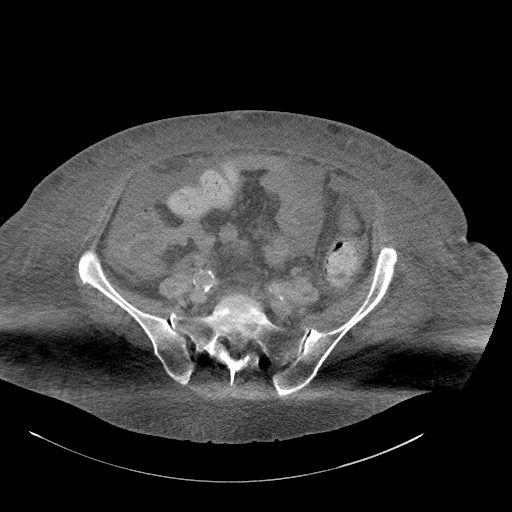
[im 3/64  bone]
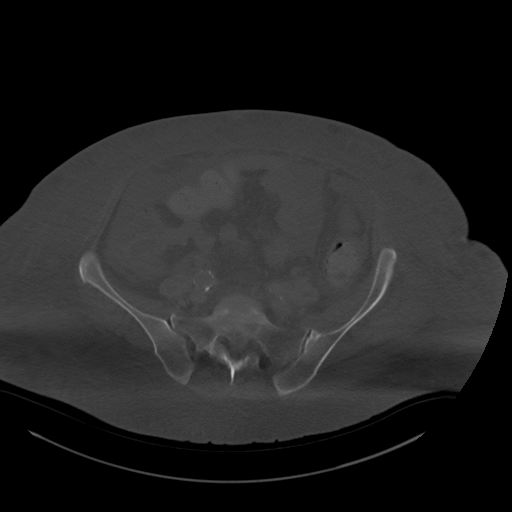
[im 9/64  soft-tissue]
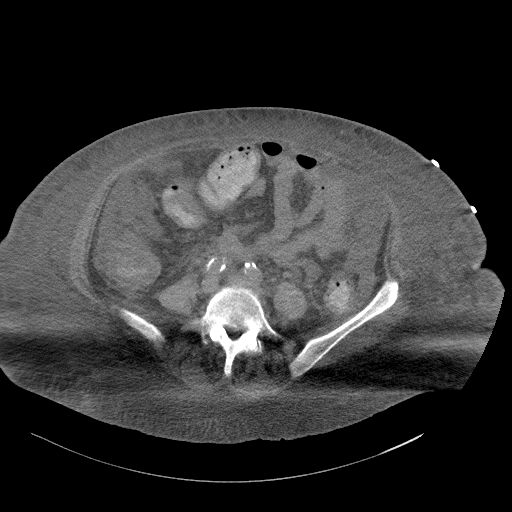
[im 14/64  soft-tissue]
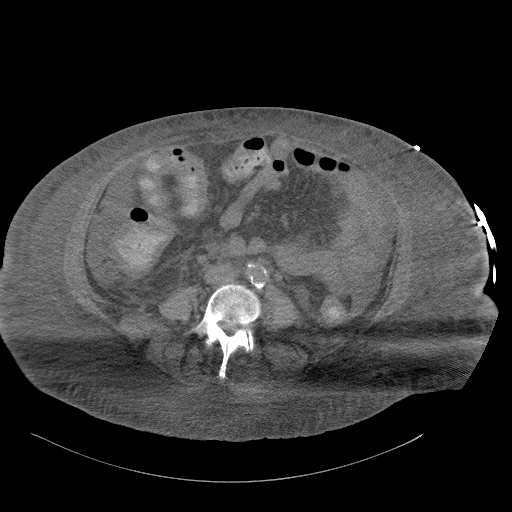
[im 20/64  soft-tissue]
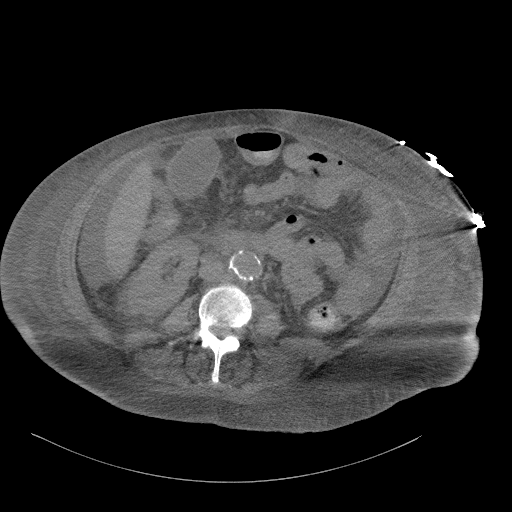
[im 25/64  soft-tissue]
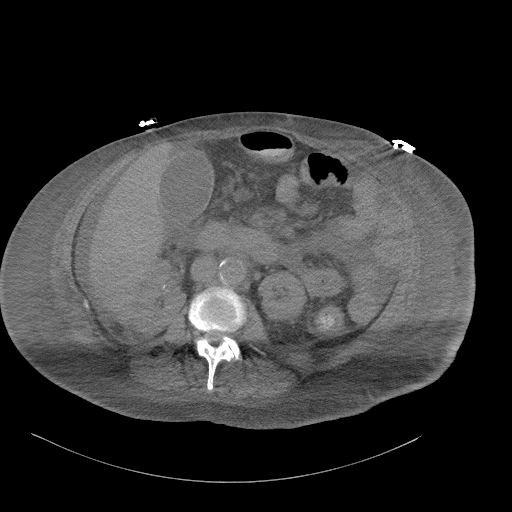
[im 31/64  soft-tissue]
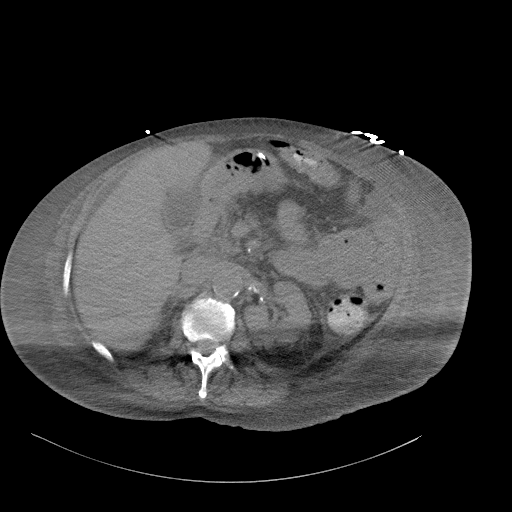
[im 33/64  soft-tissue]
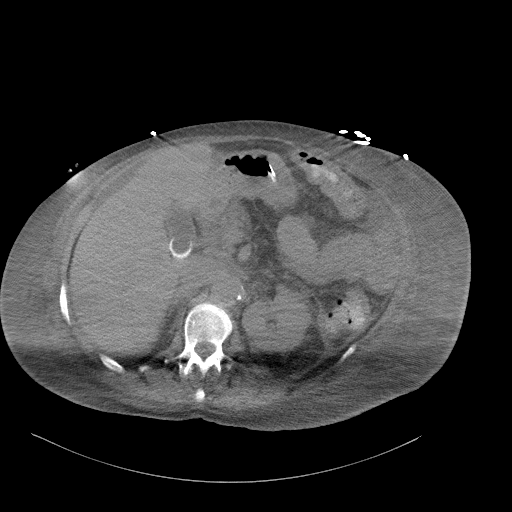
[im 39/64  soft-tissue]
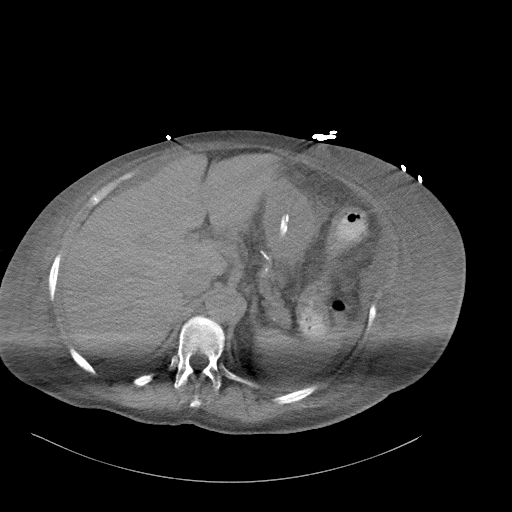
[im 44/64  soft-tissue]
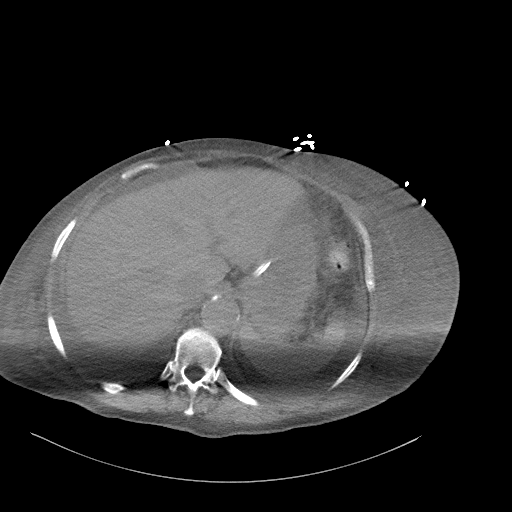
[im 44/64  bone]
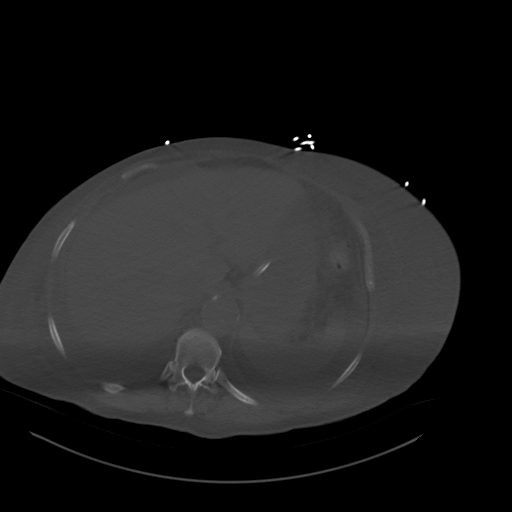
[im 50/64  soft-tissue]
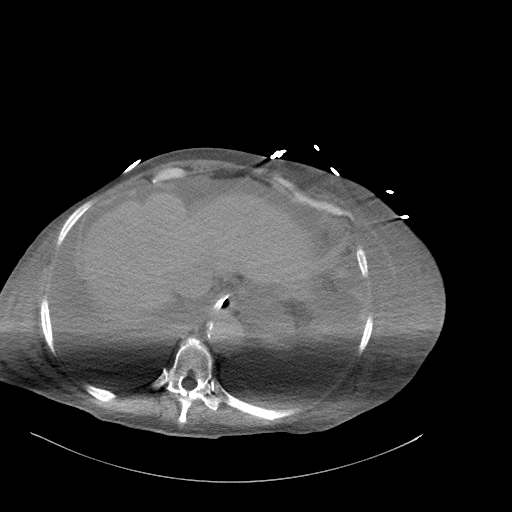
[im 55/64  soft-tissue]
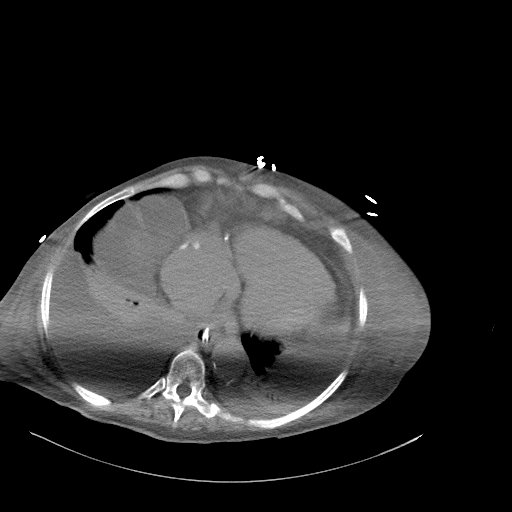
[im 61/64  soft-tissue]
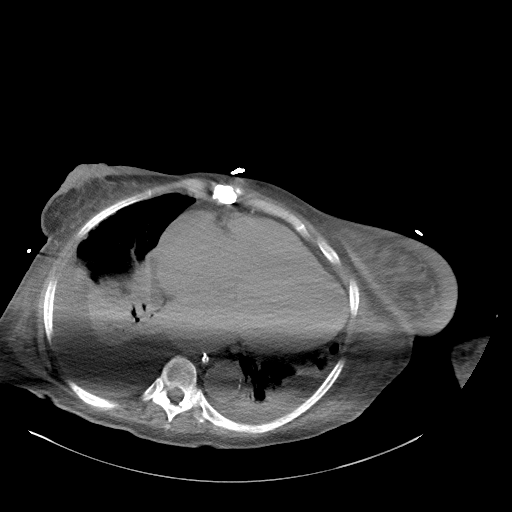

[Series 6: a/p w/o cor · coronal · non-contrast · 0.62mm/px · 3 of 151 slices shown]
[im 51/151  soft-tissue]
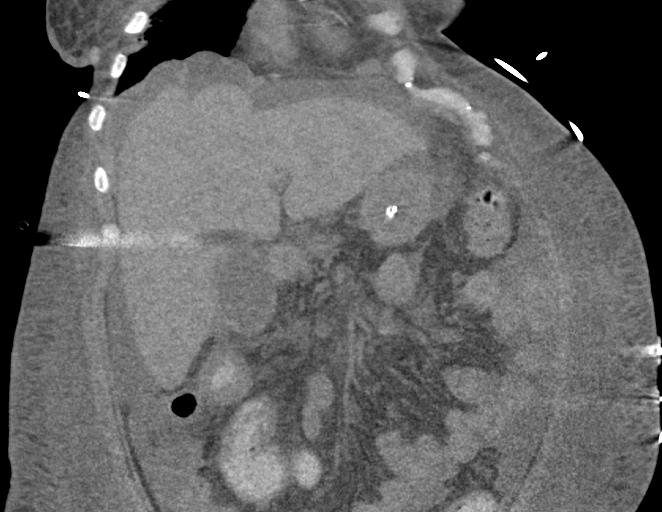
[im 67/151  soft-tissue]
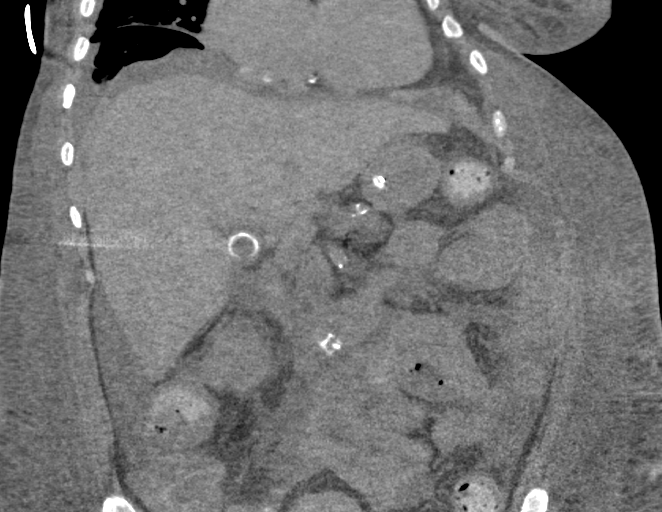
[im 84/151  soft-tissue]
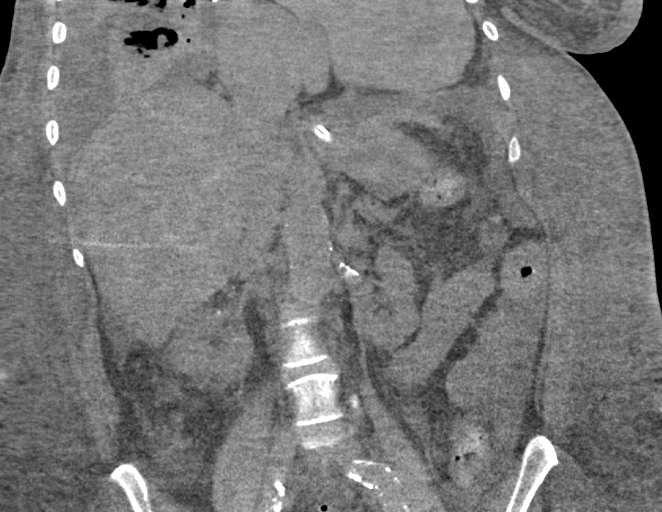

[15 of 46 positions shown; findings below may reference images not displayed]

FINDINGS: Lower chest: Body wall anasarca/edema. Right greater than left
pleural effusion with associated atelectasis/consolidation.
Partially imaged cardiomegaly with partially imaged surgical changes
of prior median sternotomy.

Hepatobiliary: Uniform attenuation of liver parenchyma.

Gallbladder somewhat distended with no significant gallbladder wall
thickening. Radiopaque gallstone in the neck of the gallbladder. No
evidence of intrahepatic or extrahepatic biliary ductal dilatation.

Pancreas: Unremarkable pancreas

Spleen: Unremarkable spleen

Adrenals/Urinary Tract: Unremarkable adrenal glands. Perinephric
stranding bilaterally. No evidence of hydronephrosis or
nephrolithiasis.

Stomach/Bowel: Gastric tube terminates within the stomach lumen
which is decompressed. Typical anatomy/relationship of the
transverse colon and stomach. Small bowel nondistended. Enteric
contrast within the colon which is nondistended. Diffuse colonic
wall thickening.

Vascular/Lymphatic: Atherosclerotic calcifications of the abdominal
aorta and the proximal iliac arteries. No lymphadenopathy.

Other: Diffuse stranding within the fat of the mesentery with free
fluid in the pericolic gutters and trace ascites adjacent to liver.
Circumferential body wall edema/anasarca.

Musculoskeletal: No acute displaced fracture. Degenerative changes
of the spine.
IMPRESSION: No CT evidence of acute intra-abdominal pathology.

Diffuse body wall and mesenteric anasarca with right greater than
left pleural effusions and trace ascites. Recommend correlation with
fluid status.

Cholelithiasis with partially stented gallbladder. If there is
concern for biliary colic, correlation with physical exam and lab
values may be useful.

Diffuse mild colonic wall thickening, favored to represent changes
related to the patient's positive fluid status and/or
hypoalbuminemia. Ischemia is not excluded.

Aortic Atherosclerosis (GJ2XL-XIH.H).
